# Patient Record
Sex: Male | Born: 1935 | Race: White | Hispanic: No | Marital: Married | Smoking: Former smoker
Health system: Southern US, Community
[De-identification: ages and names within clinical notes are randomized; demographics above are authoritative.]

## PROBLEM LIST (undated history)

## (undated) DIAGNOSIS — C44311 Basal cell carcinoma of skin of nose: Secondary | ICD-10-CM

## (undated) DIAGNOSIS — E785 Hyperlipidemia, unspecified: Secondary | ICD-10-CM

## (undated) DIAGNOSIS — Z9289 Personal history of other medical treatment: Secondary | ICD-10-CM

## (undated) DIAGNOSIS — I4891 Unspecified atrial fibrillation: Secondary | ICD-10-CM

## (undated) DIAGNOSIS — I10 Essential (primary) hypertension: Secondary | ICD-10-CM

## (undated) DIAGNOSIS — K56609 Unspecified intestinal obstruction, unspecified as to partial versus complete obstruction: Secondary | ICD-10-CM

## (undated) DIAGNOSIS — R918 Other nonspecific abnormal finding of lung field: Secondary | ICD-10-CM

## (undated) DIAGNOSIS — IMO0002 Reserved for concepts with insufficient information to code with codable children: Secondary | ICD-10-CM

## (undated) DIAGNOSIS — K37 Unspecified appendicitis: Secondary | ICD-10-CM

## (undated) DIAGNOSIS — I7100 Dissection of unspecified site of aorta: Secondary | ICD-10-CM

## (undated) DIAGNOSIS — D649 Anemia, unspecified: Secondary | ICD-10-CM

## (undated) DIAGNOSIS — N400 Enlarged prostate without lower urinary tract symptoms: Secondary | ICD-10-CM

## (undated) DIAGNOSIS — K226 Gastro-esophageal laceration-hemorrhage syndrome: Secondary | ICD-10-CM

## (undated) HISTORY — DX: Unspecified intestinal obstruction, unspecified as to partial versus complete obstruction: K56.609

## (undated) HISTORY — PX: COLONOSCOPY: SHX174

## (undated) HISTORY — DX: Gastro-esophageal laceration-hemorrhage syndrome: K22.6

## (undated) HISTORY — PX: BASAL CELL CARCINOMA EXCISION: SHX1214

## (undated) HISTORY — DX: Basal cell carcinoma of skin of nose: C44.311

## (undated) HISTORY — PX: COLONOSCOPY W/ BIOPSIES AND POLYPECTOMY: SHX1376

## (undated) HISTORY — PX: CATARACT EXTRACTION W/ INTRAOCULAR LENS  IMPLANT, BILATERAL: SHX1307

## (undated) HISTORY — DX: Reserved for concepts with insufficient information to code with codable children: IMO0002

## (undated) HISTORY — DX: Hyperlipidemia, unspecified: E78.5

## (undated) HISTORY — DX: Essential (primary) hypertension: I10

## (undated) HISTORY — DX: Benign prostatic hyperplasia without lower urinary tract symptoms: N40.0

## (undated) HISTORY — PX: HEMORRHOID SURGERY: SHX153

## (undated) HISTORY — DX: Unspecified appendicitis: K37

## (undated) HISTORY — DX: Unspecified atrial fibrillation: I48.91

---

## 2007-02-06 ENCOUNTER — Ambulatory Visit: Payer: Self-pay | Admitting: Cardiovascular Disease

## 2007-02-06 ENCOUNTER — Inpatient Hospital Stay (HOSPITAL_COMMUNITY): Admission: AD | Admit: 2007-02-06 | Discharge: 2007-02-14 | Payer: Self-pay | Admitting: Internal Medicine

## 2007-02-06 ENCOUNTER — Ambulatory Visit: Payer: Self-pay | Admitting: Internal Medicine

## 2007-02-06 DIAGNOSIS — H409 Unspecified glaucoma: Secondary | ICD-10-CM | POA: Insufficient documentation

## 2007-02-07 ENCOUNTER — Ambulatory Visit: Payer: Self-pay | Admitting: Internal Medicine

## 2007-02-07 ENCOUNTER — Encounter: Payer: Self-pay | Admitting: Internal Medicine

## 2007-02-07 HISTORY — PX: OTHER SURGICAL HISTORY: SHX169

## 2007-02-08 ENCOUNTER — Encounter: Payer: Self-pay | Admitting: Internal Medicine

## 2007-02-10 ENCOUNTER — Encounter: Payer: Self-pay | Admitting: Internal Medicine

## 2007-02-15 ENCOUNTER — Encounter: Payer: Self-pay | Admitting: Internal Medicine

## 2007-02-19 ENCOUNTER — Ambulatory Visit: Payer: Self-pay | Admitting: Cardiology

## 2007-02-26 ENCOUNTER — Ambulatory Visit: Payer: Self-pay | Admitting: Cardiovascular Disease

## 2007-03-05 ENCOUNTER — Ambulatory Visit: Payer: Self-pay | Admitting: Cardiovascular Disease

## 2007-03-08 ENCOUNTER — Ambulatory Visit: Payer: Self-pay

## 2007-03-08 LAB — CONVERTED CEMR LAB
ALT: 39 units/L (ref 0–53)
AST: 31 units/L (ref 0–37)
Albumin: 4 g/dL (ref 3.5–5.2)
Bilirubin, Direct: 0.1 mg/dL (ref 0.0–0.3)
Calcium: 9.2 mg/dL (ref 8.4–10.5)
Chloride: 105 meq/L (ref 96–112)
GFR calc Af Amer: 59 mL/min
GFR calc non Af Amer: 48 mL/min

## 2007-03-12 ENCOUNTER — Ambulatory Visit: Payer: Self-pay | Admitting: Cardiology

## 2007-03-26 ENCOUNTER — Ambulatory Visit: Payer: Self-pay | Admitting: Internal Medicine

## 2007-04-02 ENCOUNTER — Encounter: Payer: Self-pay | Admitting: Internal Medicine

## 2007-04-02 ENCOUNTER — Ambulatory Visit: Payer: Self-pay

## 2007-04-10 ENCOUNTER — Ambulatory Visit: Payer: Self-pay | Admitting: Internal Medicine

## 2007-04-10 ENCOUNTER — Ambulatory Visit: Payer: Self-pay | Admitting: Cardiology

## 2007-05-01 ENCOUNTER — Ambulatory Visit: Payer: Self-pay | Admitting: Cardiology

## 2007-05-10 ENCOUNTER — Ambulatory Visit: Payer: Self-pay | Admitting: Cardiology

## 2007-05-10 LAB — CONVERTED CEMR LAB
ALT: 52 units/L (ref 0–53)
AST: 34 units/L (ref 0–37)
Basophils Absolute: 0 10*3/uL (ref 0.0–0.1)
Bilirubin, Direct: 0.1 mg/dL (ref 0.0–0.3)
Eosinophils Relative: 2.7 % (ref 0.0–5.0)
HCT: 39.5 % (ref 39.0–52.0)
Hemoglobin: 13.9 g/dL (ref 13.0–17.0)
MCHC: 35.2 g/dL (ref 30.0–36.0)
Monocytes Absolute: 0.7 10*3/uL (ref 0.2–0.7)
Neutrophils Relative %: 57.6 % (ref 43.0–77.0)
RBC: 4.42 M/uL (ref 4.22–5.81)
RDW: 15.5 % — ABNORMAL HIGH (ref 11.5–14.6)
TSH: 4.27 microintl units/mL (ref 0.35–5.50)
Total Bilirubin: 0.7 mg/dL (ref 0.3–1.2)
Total Protein: 6.7 g/dL (ref 6.0–8.3)
WBC: 5.4 10*3/uL (ref 4.5–10.5)

## 2007-05-29 ENCOUNTER — Ambulatory Visit: Payer: Self-pay | Admitting: Cardiovascular Disease

## 2007-06-26 ENCOUNTER — Ambulatory Visit: Payer: Self-pay | Admitting: Cardiology

## 2007-07-25 ENCOUNTER — Ambulatory Visit: Payer: Self-pay | Admitting: Cardiology

## 2007-08-23 DIAGNOSIS — Z9289 Personal history of other medical treatment: Secondary | ICD-10-CM

## 2007-08-23 HISTORY — DX: Personal history of other medical treatment: Z92.89

## 2007-09-10 ENCOUNTER — Ambulatory Visit: Payer: Self-pay | Admitting: Cardiology

## 2007-09-12 ENCOUNTER — Ambulatory Visit: Payer: Self-pay | Admitting: Internal Medicine

## 2007-09-12 LAB — CONVERTED CEMR LAB
ALT: 47 units/L (ref 0–53)
AST: 32 units/L (ref 0–37)
Albumin: 3.9 g/dL (ref 3.5–5.2)
Alkaline Phosphatase: 76 units/L (ref 39–117)
Basophils Absolute: 0 10*3/uL (ref 0.0–0.1)
Basophils Relative: 0.7 % (ref 0.0–1.0)
MCHC: 35.3 g/dL (ref 30.0–36.0)
Monocytes Absolute: 0.6 10*3/uL (ref 0.2–0.7)
Monocytes Relative: 11.4 % — ABNORMAL HIGH (ref 3.0–11.0)
Platelets: 183 10*3/uL (ref 150–400)
RBC: 4.8 M/uL (ref 4.22–5.81)
RDW: 12.1 % (ref 11.5–14.6)
TSH: 6.65 microintl units/mL — ABNORMAL HIGH (ref 0.35–5.50)
Total Bilirubin: 0.8 mg/dL (ref 0.3–1.2)

## 2007-09-27 HISTORY — PX: OTHER SURGICAL HISTORY: SHX169

## 2007-10-08 ENCOUNTER — Ambulatory Visit: Payer: Self-pay | Admitting: Internal Medicine

## 2007-10-08 ENCOUNTER — Ambulatory Visit: Payer: Self-pay | Admitting: Cardiovascular Disease

## 2007-10-08 DIAGNOSIS — I429 Cardiomyopathy, unspecified: Secondary | ICD-10-CM | POA: Insufficient documentation

## 2007-10-08 DIAGNOSIS — I480 Paroxysmal atrial fibrillation: Secondary | ICD-10-CM | POA: Insufficient documentation

## 2007-10-08 DIAGNOSIS — I10 Essential (primary) hypertension: Secondary | ICD-10-CM

## 2007-11-05 ENCOUNTER — Ambulatory Visit: Payer: Self-pay | Admitting: Internal Medicine

## 2007-11-05 ENCOUNTER — Telehealth (INDEPENDENT_AMBULATORY_CARE_PROVIDER_SITE_OTHER): Payer: Self-pay | Admitting: *Deleted

## 2007-11-05 ENCOUNTER — Encounter (INDEPENDENT_AMBULATORY_CARE_PROVIDER_SITE_OTHER): Payer: Self-pay | Admitting: *Deleted

## 2007-11-05 LAB — CONVERTED CEMR LAB
Ketones, urine, test strip: NEGATIVE
Nitrite: NEGATIVE
OCCULT 1: NEGATIVE
Protein, U semiquant: NEGATIVE

## 2007-11-13 ENCOUNTER — Telehealth: Payer: Self-pay | Admitting: Internal Medicine

## 2007-11-13 LAB — CONVERTED CEMR LAB
CO2: 28 meq/L (ref 19–32)
Cholesterol: 210 mg/dL (ref 0–200)
Creatinine, Ser: 1.3 mg/dL (ref 0.4–1.5)
GFR calc Af Amer: 69 mL/min
HDL: 32.9 mg/dL — ABNORMAL LOW (ref >39.0)
PSA: 0.72 ng/mL (ref 0.10–4.00)
Potassium: 4.7 meq/L (ref 3.5–5.1)
Sodium: 142 meq/L (ref 135–145)
Triglycerides: 162 mg/dL — ABNORMAL HIGH (ref 0–149)
VLDL: 32 mg/dL (ref 0–40)

## 2007-12-05 ENCOUNTER — Ambulatory Visit: Payer: Self-pay | Admitting: Cardiology

## 2008-01-02 ENCOUNTER — Ambulatory Visit: Payer: Self-pay | Admitting: Cardiology

## 2008-01-30 ENCOUNTER — Ambulatory Visit: Payer: Self-pay | Admitting: Cardiology

## 2008-02-20 HISTORY — PX: APPENDECTOMY: SHX54

## 2008-02-20 HISTORY — PX: EXPLORATORY LAPAROTOMY: SUR591

## 2008-02-27 ENCOUNTER — Ambulatory Visit: Payer: Self-pay | Admitting: Cardiovascular Disease

## 2008-03-11 ENCOUNTER — Telehealth: Payer: Self-pay | Admitting: Internal Medicine

## 2008-03-11 ENCOUNTER — Inpatient Hospital Stay (HOSPITAL_COMMUNITY): Admission: AD | Admit: 2008-03-11 | Discharge: 2008-03-26 | Payer: Self-pay | Admitting: Internal Medicine

## 2008-03-11 ENCOUNTER — Ambulatory Visit: Payer: Self-pay | Admitting: Internal Medicine

## 2008-03-11 LAB — CONVERTED CEMR LAB
Bilirubin Urine: NEGATIVE
Hemoglobin: 15.2 g/dL
Ketones, urine, test strip: NEGATIVE
Protein, U semiquant: NEGATIVE
Urobilinogen, UA: 0.2

## 2008-03-16 ENCOUNTER — Encounter (INDEPENDENT_AMBULATORY_CARE_PROVIDER_SITE_OTHER): Payer: Self-pay | Admitting: General Surgery

## 2008-03-16 ENCOUNTER — Ambulatory Visit: Payer: Self-pay | Admitting: Internal Medicine

## 2008-03-16 ENCOUNTER — Encounter: Payer: Self-pay | Admitting: Internal Medicine

## 2008-03-17 ENCOUNTER — Encounter: Payer: Self-pay | Admitting: Internal Medicine

## 2008-03-18 ENCOUNTER — Ambulatory Visit: Payer: Self-pay | Admitting: Internal Medicine

## 2008-03-25 ENCOUNTER — Telehealth (INDEPENDENT_AMBULATORY_CARE_PROVIDER_SITE_OTHER): Payer: Self-pay | Admitting: *Deleted

## 2008-03-28 ENCOUNTER — Ambulatory Visit: Payer: Self-pay | Admitting: Internal Medicine

## 2008-04-11 ENCOUNTER — Ambulatory Visit: Payer: Self-pay | Admitting: Internal Medicine

## 2008-04-15 ENCOUNTER — Encounter (INDEPENDENT_AMBULATORY_CARE_PROVIDER_SITE_OTHER): Payer: Self-pay | Admitting: *Deleted

## 2008-04-15 LAB — CONVERTED CEMR LAB
ALT: 27 units/L (ref 0–53)
Basophils Relative: 0.9 % (ref 0.0–3.0)
Bilirubin, Direct: 0.1 mg/dL (ref 0.0–0.3)
Calcium: 9.3 mg/dL (ref 8.4–10.5)
Eosinophils Relative: 21.9 % — ABNORMAL HIGH (ref 0.0–5.0)
HCT: 35.1 % — ABNORMAL LOW (ref 39.0–52.0)
Hemoglobin: 11.9 g/dL — ABNORMAL LOW (ref 13.0–17.0)
Monocytes Absolute: 0.8 10*3/uL (ref 0.1–1.0)
Monocytes Relative: 11.1 % (ref 3.0–12.0)
Neutro Abs: 2.8 10*3/uL (ref 1.4–7.7)
RBC: 3.85 M/uL — ABNORMAL LOW (ref 4.22–5.81)
Total Protein: 6.8 g/dL (ref 6.0–8.3)
WBC: 7.1 10*3/uL (ref 4.5–10.5)

## 2008-04-22 ENCOUNTER — Encounter: Payer: Self-pay | Admitting: Internal Medicine

## 2008-06-02 ENCOUNTER — Ambulatory Visit: Payer: Self-pay | Admitting: Internal Medicine

## 2008-06-03 ENCOUNTER — Encounter: Payer: Self-pay | Admitting: Internal Medicine

## 2008-06-05 ENCOUNTER — Telehealth (INDEPENDENT_AMBULATORY_CARE_PROVIDER_SITE_OTHER): Payer: Self-pay | Admitting: *Deleted

## 2008-06-16 ENCOUNTER — Telehealth (INDEPENDENT_AMBULATORY_CARE_PROVIDER_SITE_OTHER): Payer: Self-pay | Admitting: *Deleted

## 2008-06-16 ENCOUNTER — Ambulatory Visit: Payer: Self-pay | Admitting: Internal Medicine

## 2008-06-24 ENCOUNTER — Telehealth: Payer: Self-pay | Admitting: Internal Medicine

## 2008-06-30 ENCOUNTER — Ambulatory Visit: Payer: Self-pay | Admitting: Internal Medicine

## 2008-07-02 ENCOUNTER — Encounter (INDEPENDENT_AMBULATORY_CARE_PROVIDER_SITE_OTHER): Payer: Self-pay | Admitting: *Deleted

## 2008-07-04 ENCOUNTER — Ambulatory Visit: Payer: Self-pay | Admitting: Internal Medicine

## 2008-12-04 ENCOUNTER — Telehealth (INDEPENDENT_AMBULATORY_CARE_PROVIDER_SITE_OTHER): Payer: Self-pay | Admitting: *Deleted

## 2008-12-30 ENCOUNTER — Ambulatory Visit: Payer: Self-pay | Admitting: Internal Medicine

## 2008-12-30 DIAGNOSIS — E785 Hyperlipidemia, unspecified: Secondary | ICD-10-CM

## 2008-12-30 DIAGNOSIS — L989 Disorder of the skin and subcutaneous tissue, unspecified: Secondary | ICD-10-CM | POA: Insufficient documentation

## 2008-12-31 ENCOUNTER — Encounter (INDEPENDENT_AMBULATORY_CARE_PROVIDER_SITE_OTHER): Payer: Self-pay | Admitting: *Deleted

## 2009-01-06 LAB — CONVERTED CEMR LAB
BUN: 19 mg/dL (ref 6–23)
Eosinophils Relative: 3.3 % (ref 0.0–5.0)
GFR calc non Af Amer: 68.91 mL/min (ref >60)
HCT: 41.3 % (ref 39.0–52.0)
Lymphs Abs: 1.8 10*3/uL (ref 0.7–4.0)
Monocytes Relative: 6.1 % (ref 3.0–12.0)
Neutrophils Relative %: 54 % (ref 43.0–77.0)
Platelets: 196 10*3/uL (ref 150.0–400.0)
Potassium: 4.9 meq/L (ref 3.5–5.1)
TSH: 3.85 microintl units/mL (ref 0.35–5.50)
WBC: 4.9 10*3/uL (ref 4.5–10.5)

## 2009-01-12 ENCOUNTER — Ambulatory Visit: Payer: Self-pay | Admitting: Internal Medicine

## 2009-01-12 LAB — CONVERTED CEMR LAB
Bilirubin Urine: NEGATIVE
Glucose, Urine, Semiquant: NEGATIVE
Ketones, urine, test strip: NEGATIVE
RBC / HPF: NONE SEEN (ref <3)
WBC, UA: NONE SEEN cells/hpf (ref <3)

## 2009-01-14 ENCOUNTER — Ambulatory Visit: Payer: Self-pay | Admitting: Internal Medicine

## 2009-01-14 ENCOUNTER — Telehealth: Payer: Self-pay | Admitting: Internal Medicine

## 2009-01-16 ENCOUNTER — Telehealth: Payer: Self-pay | Admitting: Internal Medicine

## 2009-01-16 LAB — CONVERTED CEMR LAB
Albumin: 3.4 g/dL — ABNORMAL LOW (ref 3.5–5.2)
Eosinophils Relative: 0.1 % (ref 0.0–5.0)
HCT: 43.1 % (ref 39.0–52.0)
Hemoglobin: 15.1 g/dL (ref 13.0–17.0)
Lymphocytes Relative: 10.5 % — ABNORMAL LOW (ref 12.0–46.0)
Lymphs Abs: 1 10*3/uL (ref 0.7–4.0)
Monocytes Relative: 10.9 % (ref 3.0–12.0)
Neutro Abs: 7.4 10*3/uL (ref 1.4–7.7)
Platelets: 184 10*3/uL (ref 150.0–400.0)
WBC: 9.5 10*3/uL (ref 4.5–10.5)

## 2009-02-04 ENCOUNTER — Ambulatory Visit: Payer: Self-pay | Admitting: Internal Medicine

## 2009-02-04 ENCOUNTER — Telehealth: Payer: Self-pay | Admitting: Internal Medicine

## 2009-02-12 ENCOUNTER — Ambulatory Visit: Payer: Self-pay | Admitting: Internal Medicine

## 2009-02-16 LAB — CONVERTED CEMR LAB
ALT: 25 units/L (ref 0–53)
AST: 27 units/L (ref 0–37)
Alkaline Phosphatase: 92 units/L (ref 39–117)
Total Bilirubin: 0.8 mg/dL (ref 0.3–1.2)

## 2009-04-14 ENCOUNTER — Ambulatory Visit: Payer: Self-pay | Admitting: Internal Medicine

## 2009-04-15 ENCOUNTER — Encounter (INDEPENDENT_AMBULATORY_CARE_PROVIDER_SITE_OTHER): Payer: Self-pay | Admitting: *Deleted

## 2009-04-20 ENCOUNTER — Ambulatory Visit: Payer: Self-pay | Admitting: Internal Medicine

## 2009-04-20 LAB — CONVERTED CEMR LAB
Alkaline Phosphatase: 95 units/L (ref 39–117)
Bilirubin, Direct: 0 mg/dL (ref 0.0–0.3)
PSA: 1.11 ng/mL (ref 0.10–4.00)
Total Bilirubin: 0.9 mg/dL (ref 0.3–1.2)
Total Protein: 7.6 g/dL (ref 6.0–8.3)
VLDL: 45.6 mg/dL — ABNORMAL HIGH (ref 0.0–40.0)

## 2009-04-21 ENCOUNTER — Encounter (INDEPENDENT_AMBULATORY_CARE_PROVIDER_SITE_OTHER): Payer: Self-pay | Admitting: *Deleted

## 2009-04-21 LAB — CONVERTED CEMR LAB: Fecal Occult Bld: NEGATIVE

## 2009-06-19 IMAGING — CR DG ABDOMEN ACUTE W/ 1V CHEST
4 series · 4 of 4 positions shown · non-contrast
Comparison: Two views abdomen 03/13/2008 and portable chest
02/12/2007.

CLINICAL DATA: Small bowel obstruction, abdominal pain.

ACUTE ABDOMEN SERIES (ABDOMEN 2 VIEW & CHEST 1 VIEW)

[w chest pa]
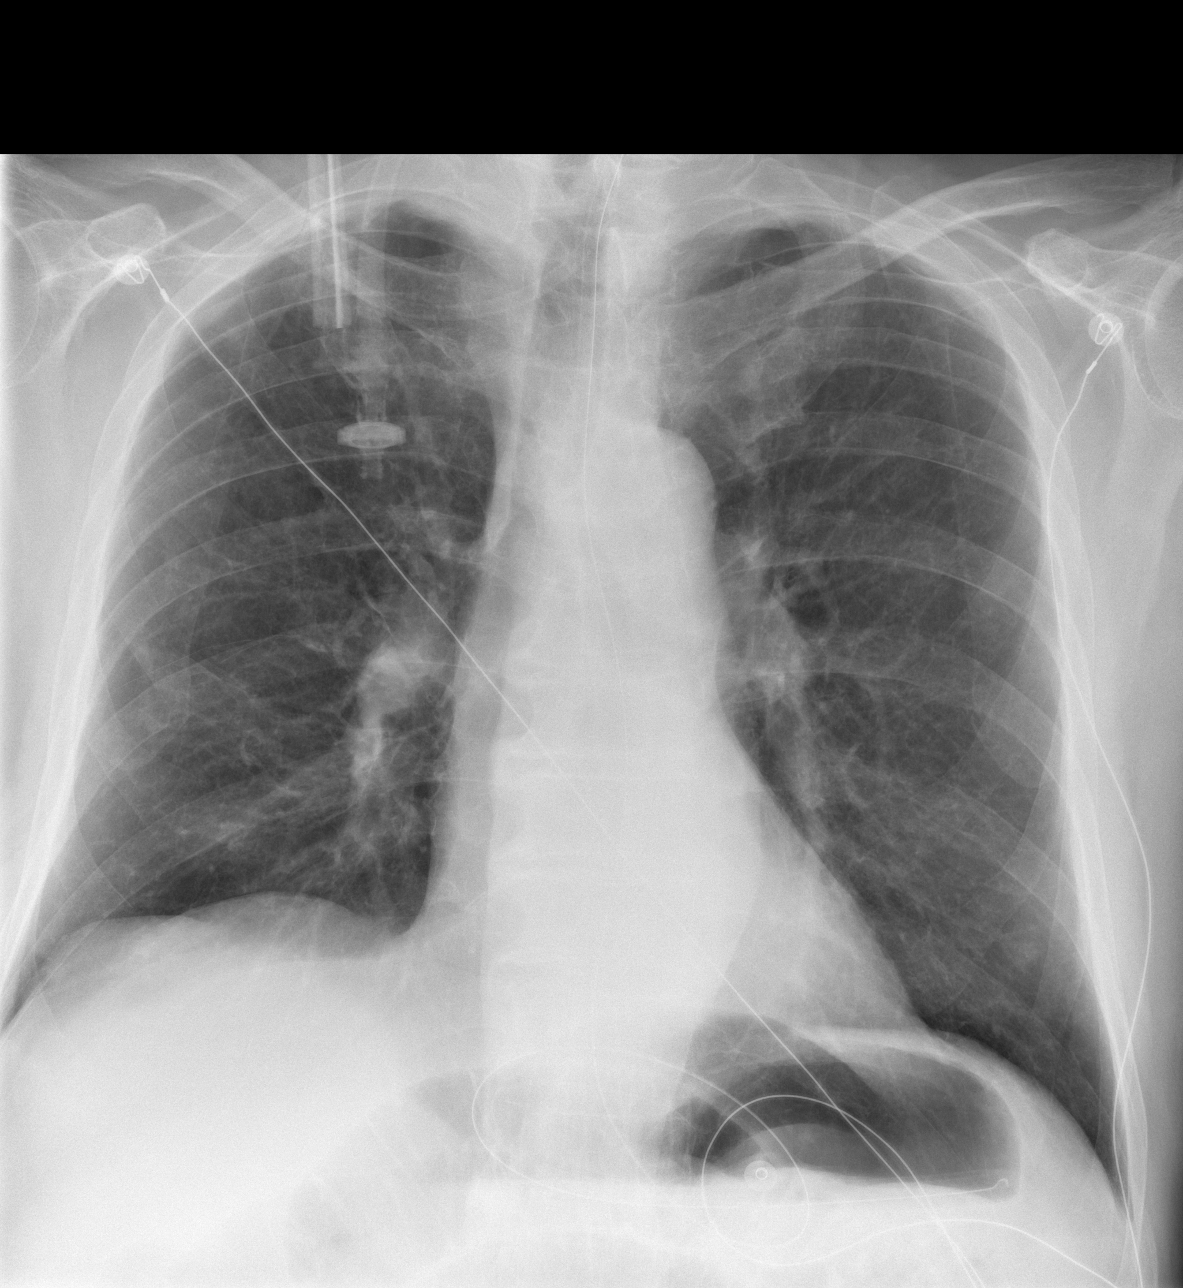

[w abdomen upright]
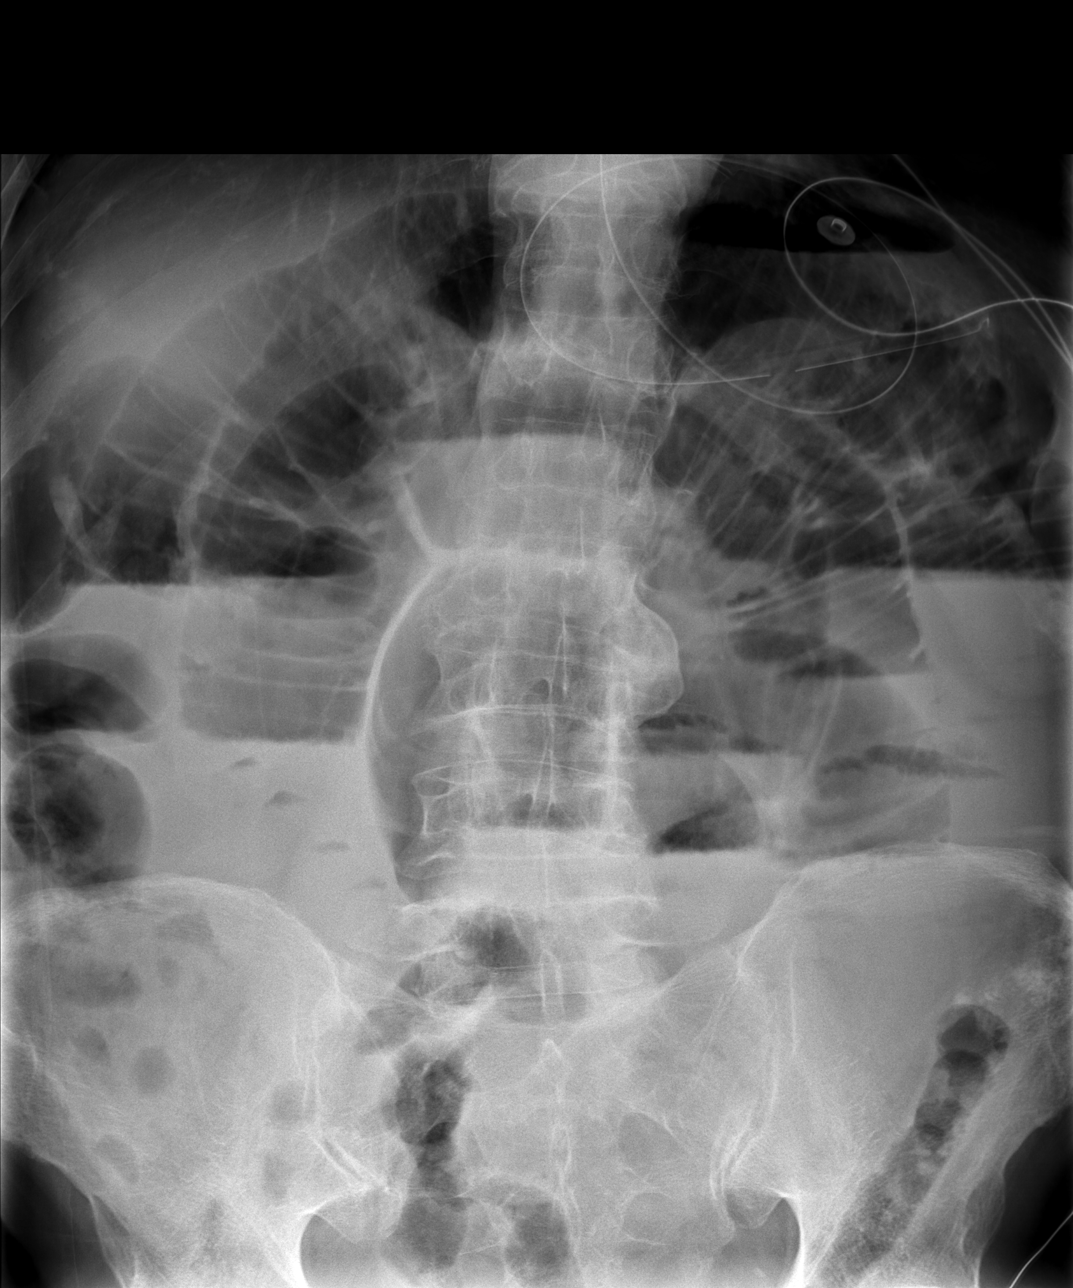

[t abdomen supine (1 of 2)]
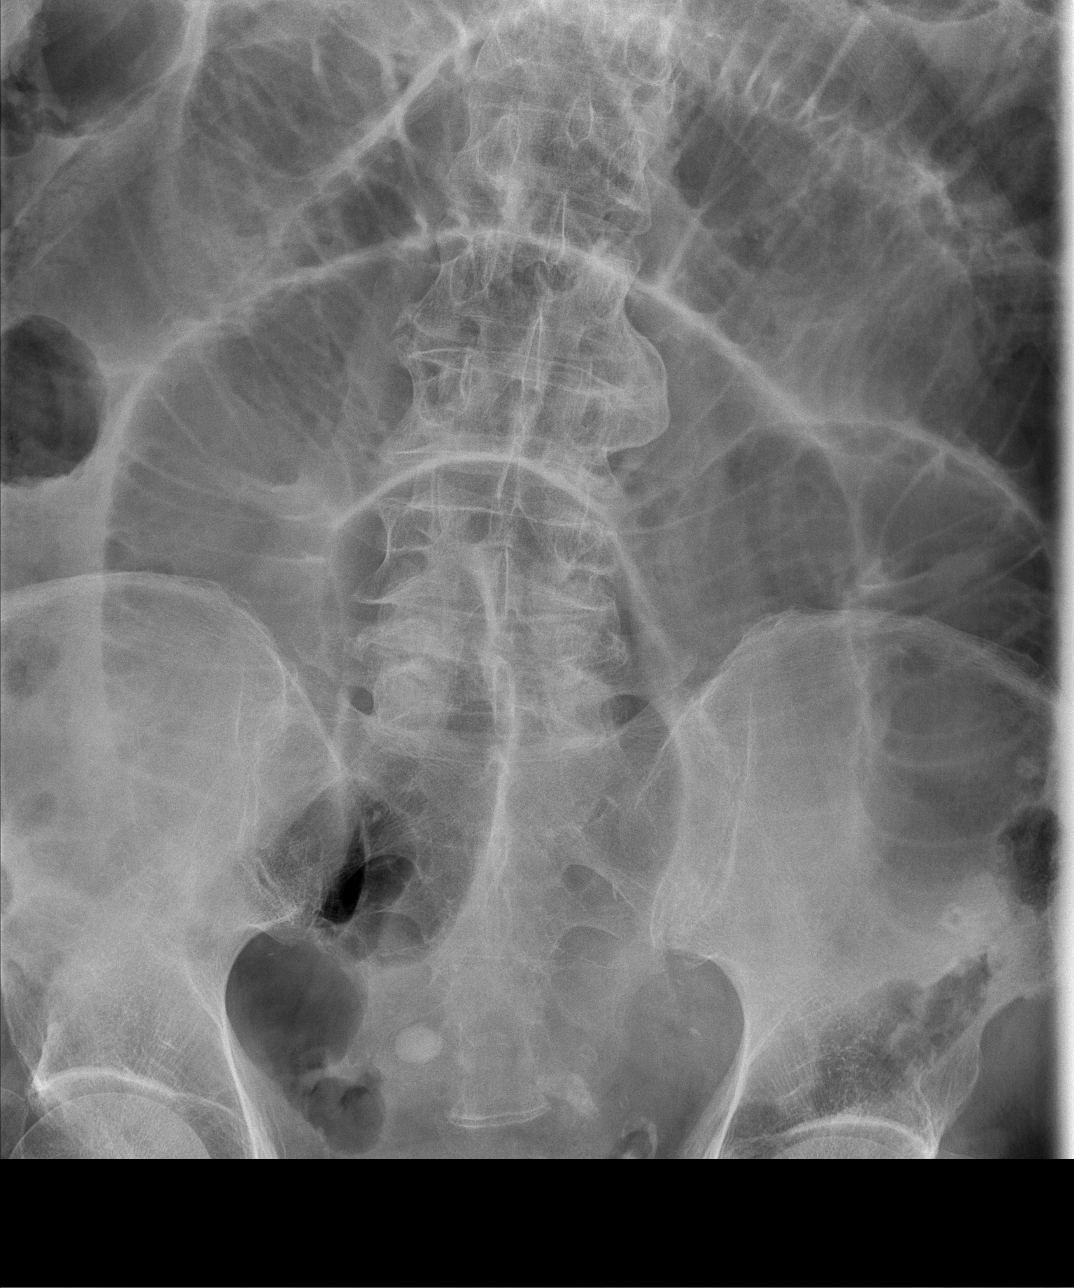

[t abdomen supine (2 of 2)]
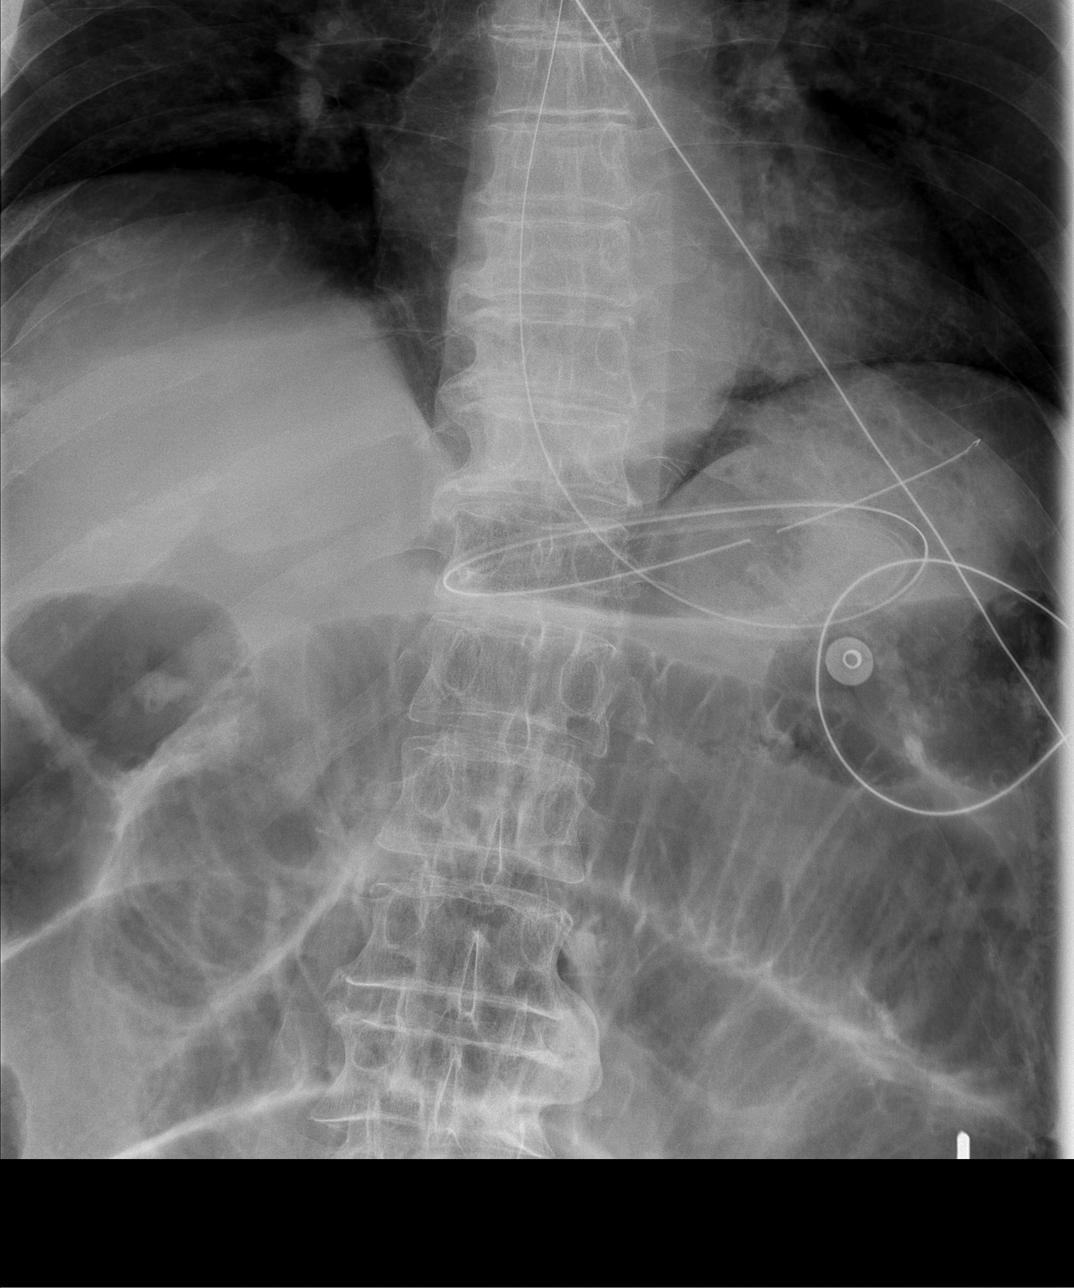

[4 of 4 positions shown; findings below may reference images not displayed]

FINDINGS: Single view of the chest demonstrates clear lungs and
normal heart size.  There is no pleural effusion.

Two views the abdomen show NG tube with its tip in the stomach.
Gaseous distension of small bowel has worsened with loops now
measuring up to approximate 6.7 cm in diameter compared to 5.4 cm.
No free intraperitoneal air is identified.
IMPRESSION: Worsened appearance of small bowel obstruction.

## 2009-06-20 IMAGING — CR DG CHEST 1V PORT
1 series · 1 of 1 positions shown · non-contrast
Comparison: 02/12/2007.

CLINICAL DATA: Line placement position.

PORTABLE CHEST - 1 VIEW

[AP]
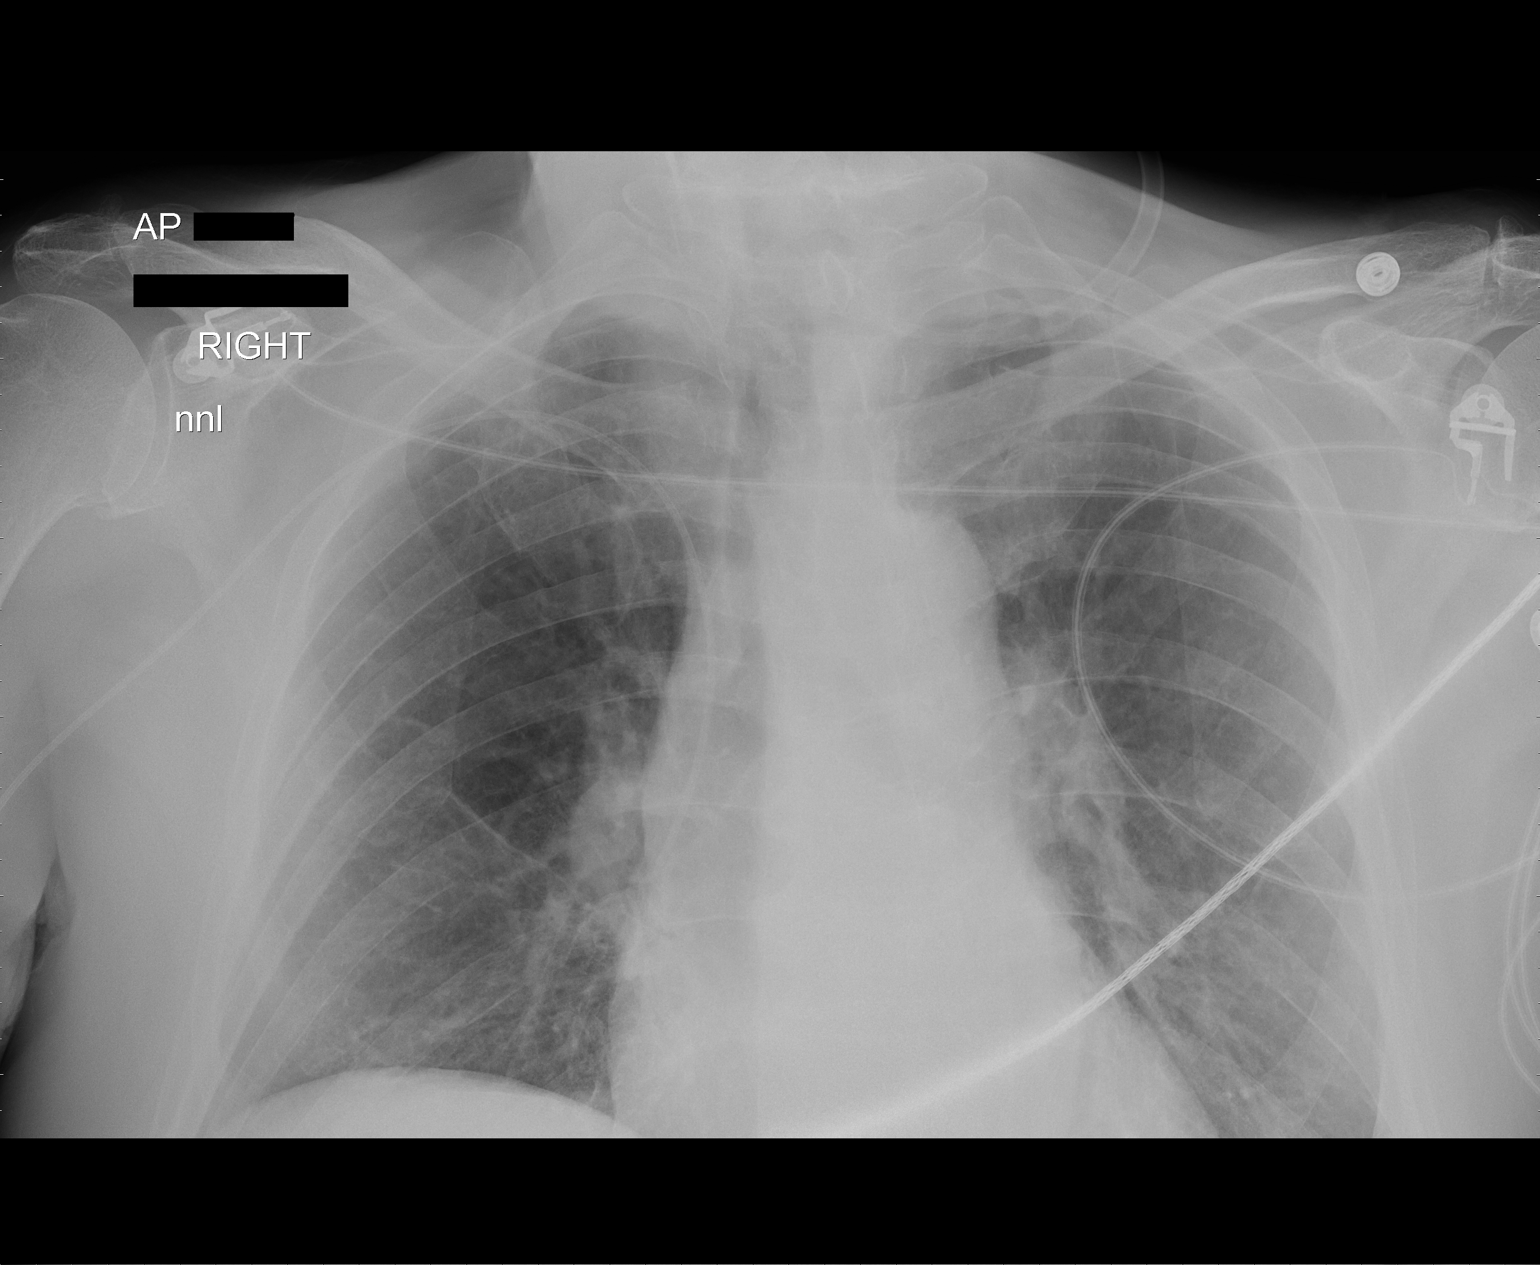

[1 of 1 positions shown; findings below may reference images not displayed]

FINDINGS: Right PICC line tip distal superior vena cava.  Biapical
pleural thickening without bony destruction unchanged.  Top normal
sized heart.  Mild central pulmonary vascular prominence.  No
segmental region of consolidation.
IMPRESSION: Right PICC line tip distal superior vena cava.

## 2009-06-24 ENCOUNTER — Encounter (INDEPENDENT_AMBULATORY_CARE_PROVIDER_SITE_OTHER): Payer: Self-pay | Admitting: *Deleted

## 2009-07-10 ENCOUNTER — Ambulatory Visit: Payer: Self-pay | Admitting: Internal Medicine

## 2009-07-13 ENCOUNTER — Telehealth: Payer: Self-pay | Admitting: Internal Medicine

## 2009-07-28 ENCOUNTER — Ambulatory Visit: Payer: Self-pay

## 2009-07-28 ENCOUNTER — Ambulatory Visit: Payer: Self-pay | Admitting: Internal Medicine

## 2009-07-28 ENCOUNTER — Ambulatory Visit (HOSPITAL_COMMUNITY): Admission: RE | Admit: 2009-07-28 | Discharge: 2009-07-28 | Payer: Self-pay | Admitting: Internal Medicine

## 2009-07-28 ENCOUNTER — Encounter: Payer: Self-pay | Admitting: Internal Medicine

## 2009-07-31 ENCOUNTER — Telehealth (INDEPENDENT_AMBULATORY_CARE_PROVIDER_SITE_OTHER): Payer: Self-pay | Admitting: *Deleted

## 2009-10-12 ENCOUNTER — Ambulatory Visit: Payer: Self-pay | Admitting: Internal Medicine

## 2009-10-14 ENCOUNTER — Telehealth (INDEPENDENT_AMBULATORY_CARE_PROVIDER_SITE_OTHER): Payer: Self-pay | Admitting: *Deleted

## 2009-10-14 LAB — CONVERTED CEMR LAB: TSH: 6.4 microintl units/mL — ABNORMAL HIGH (ref 0.35–5.50)

## 2009-10-15 ENCOUNTER — Ambulatory Visit: Payer: Self-pay | Admitting: Internal Medicine

## 2009-10-22 ENCOUNTER — Telehealth: Payer: Self-pay | Admitting: Internal Medicine

## 2009-10-22 LAB — CONVERTED CEMR LAB
Free T4: 0.8 ng/dL (ref 0.6–1.6)
T3, Free: 2.6 pg/mL (ref 2.3–4.2)

## 2009-11-18 ENCOUNTER — Encounter (INDEPENDENT_AMBULATORY_CARE_PROVIDER_SITE_OTHER): Payer: Self-pay | Admitting: *Deleted

## 2009-12-29 ENCOUNTER — Ambulatory Visit: Payer: Self-pay | Admitting: Internal Medicine

## 2010-04-05 ENCOUNTER — Telehealth (INDEPENDENT_AMBULATORY_CARE_PROVIDER_SITE_OTHER): Payer: Self-pay | Admitting: *Deleted

## 2010-04-28 ENCOUNTER — Telehealth (INDEPENDENT_AMBULATORY_CARE_PROVIDER_SITE_OTHER): Payer: Self-pay | Admitting: *Deleted

## 2010-06-01 ENCOUNTER — Encounter: Payer: Self-pay | Admitting: Internal Medicine

## 2010-06-14 ENCOUNTER — Telehealth: Payer: Self-pay | Admitting: Internal Medicine

## 2010-06-21 ENCOUNTER — Ambulatory Visit: Payer: Self-pay | Admitting: Internal Medicine

## 2010-06-28 ENCOUNTER — Ambulatory Visit: Payer: Self-pay | Admitting: Internal Medicine

## 2010-06-28 DIAGNOSIS — R7989 Other specified abnormal findings of blood chemistry: Secondary | ICD-10-CM | POA: Insufficient documentation

## 2010-06-29 ENCOUNTER — Ambulatory Visit: Payer: Self-pay | Admitting: Internal Medicine

## 2010-07-01 ENCOUNTER — Encounter (INDEPENDENT_AMBULATORY_CARE_PROVIDER_SITE_OTHER): Payer: Self-pay | Admitting: *Deleted

## 2010-07-01 LAB — CONVERTED CEMR LAB
BUN: 18 mg/dL (ref 6–23)
Basophils Relative: 0.6 % (ref 0.0–3.0)
CO2: 27 meq/L (ref 19–32)
Chloride: 105 meq/L (ref 96–112)
Eosinophils Absolute: 0.2 10*3/uL (ref 0.0–0.7)
Eosinophils Relative: 3.1 % (ref 0.0–5.0)
Free T4: 0.73 ng/dL (ref 0.60–1.60)
Glucose, Bld: 96 mg/dL (ref 70–99)
HCT: 43.5 % (ref 39.0–52.0)
HDL: 39 mg/dL — ABNORMAL LOW (ref >39.00)
Hemoglobin: 15.2 g/dL (ref 13.0–17.0)
MCHC: 34.8 g/dL (ref 30.0–36.0)
MCV: 95.5 fL (ref 78.0–100.0)
Monocytes Absolute: 0.6 10*3/uL (ref 0.1–1.0)
Neutro Abs: 4 10*3/uL (ref 1.4–7.7)
Neutrophils Relative %: 58.8 % (ref 43.0–77.0)
PSA: 1.06 ng/mL (ref 0.10–4.00)
Potassium: 4.4 meq/L (ref 3.5–5.1)
RBC: 4.56 M/uL (ref 4.22–5.81)
Sodium: 140 meq/L (ref 135–145)
TSH: 5.12 microintl units/mL (ref 0.35–5.50)
WBC: 6.8 10*3/uL (ref 4.5–10.5)

## 2010-07-12 ENCOUNTER — Ambulatory Visit: Payer: Self-pay | Admitting: Internal Medicine

## 2010-07-19 LAB — CONVERTED CEMR LAB: Fecal Occult Bld: NEGATIVE

## 2010-09-19 LAB — CONVERTED CEMR LAB
Calcium: 9.4 mg/dL (ref 8.4–10.5)
GFR calc Af Amer: 69 mL/min
Glucose, Bld: 74 mg/dL (ref 70–99)
Hemoglobin: 16.3 g/dL
Sodium: 142 meq/L (ref 135–145)
TSH: 5.491 microintl units/mL — ABNORMAL HIGH (ref 0.350–4.500)

## 2010-09-21 NOTE — Progress Notes (Signed)
Summary: refill  Phone Note Refill Request Message from:  Fax from Pharmacy on April 05, 2010 2:24 PM  Refills Requested: Medication #1:  BISOPROLOL FUMARATE 5 MG  TABS 1/2 by mouth bid Shela Leff [id number]  Initial call taken by: Okey Regal Spring,  April 05, 2010 2:25 PM    Prescriptions: BISOPROLOL FUMARATE 5 MG  TABS (BISOPROLOL FUMARATE) 1/2 by mouth bid  #90 x 3   Entered by:   [name] CMA   Authorized by:   Nolon Rod. Paz MD   Signed by:   [name] CMA on 04/05/2010   Method used:   Electronically to        [facility]* (retail)       [address] [place], [place]  [address]       Ph: [phone] or [phone]       Fax: [phone]   RxID:   [id number]

## 2010-09-21 NOTE — Assessment & Plan Note (Signed)
Summary: 6 mth fu/kdc   Vital Signs:  Patient profile:   77 year old male Height:      74.5 inches Weight:      255.50 pounds BMI:     32.48 Pulse rate:   60 / minute BP sitting:   140 / 70  Vitals Entered By: Kandice Hams (Dec 29, 2009 12:36 PM) CC: 6 mos followup   History of Present Illness: ROV HTN -- ambulatory BPs 120s, pulse in the 79s  CARDIOMYOPATHY--saw cardiology 06/2009, echo was ordered and it was okay . he also takes amiodarone, dose was decreased by cardiology; TSH is slightly elevated, free T3 T4 normal    Allergies: No Known Drug Allergies  Past History:  Past Medical History: Reviewed history from 04/14/2009 and no changes required. HTN  CARDIOMYOPATHY, SECONDARY to Tachycadrdia     A.  ACUTE ON CHRONIC ADMIT FOR CHF 6/08     B.  CHF COUPLED WITH ATRIAL FIBRILLATION RVR ELECTROPHYSIOLOGY STUDY 09/27/07     A.  MULTIPLE FLUTTERS AND ATRIALFIB --NO ABLATION PERFORMED     B.  IBUTILIDE CARDIOVERSION     C.  AMIODARONE LOAD -- D/C IN SINUS RHYTHM     D.  COUMADIN ANTICOAGULATION  --STOPPED AT TIME OF GI BLEED 11/09 MALLORY-WEISS TEAR/ ESOPHAGITIS -- 6 UNIT HEMORRHAGE --  EPI INJECTION GLAUCOMA NOS (ICD-365.9) Atrial fibrillation appendicitis, SBO 7-09, RUPTURE WITH RLQ ABSCESS 03/16/08 Hyperlipidemia  Past Surgical History: Reviewed history from 08/05/2008 and no changes required. Hemorrhoidectomy TEE/DCCV 02/07/07 EP STUDY 09/27/07 SEE PMH EXPLORAQTORY LAPOROTOMY -- RUPTURED APPENDIX, RLQ ABSCESS 03/16/08 SMALL BOWEL OBSTRUDTION MALLORY-WEISS TEAR, EPINEPHRINE INJECTION  (6 UNITS LOST) -  7/09  Social History: Reviewed history from 04/14/2009 and no changes required. WORKS AS A CABINETMAKER-- works part time  MARRIED 2 CHILDREN  REMOTE TOBACCO ETOH--no  Review of Systems CV:  Denies chest pain or discomfort, palpitations, and swelling of feet. Resp:  Denies cough and shortness of breath.  Physical Exam  General:  alert and well-developed.     Lungs:  normal respiratory effort, no intercostal retractions, no accessory muscle use, and normal breath sounds.   Heart:  normal rate, seems to be on regular rhythm, and no murmur.   Extremities:  no pretibial edema bilaterally    Impression & Recommendations:  Problem # 1:  HYPERLIPIDEMIA (ICD-272.4) patient was recommended  cholesterol  medication but declined explained the patient the benefits of lower his cholesterol, ie decrease his risk of heart attack and strokes Plan: Recheck on return to the office Labs Reviewed: SGOT: 26 (04/14/2009)   SGPT: 29 (04/14/2009)   HDL:34.80 (04/14/2009), 32.9 (11/05/2007)  LDL:DEL (11/05/2007)  Chol:208 (04/14/2009), 210 (11/05/2007)  Trig:228.0 (04/14/2009), 162 (11/05/2007)  Problem # 2:  HYPERTENSION (ICD-401.9) good ambulatory blood pressures His updated medication list for this problem includes:    Bisoprolol Fumarate 5 Mg Tabs (Bisoprolol fumarate) .Marland Kitchen... 1/2 by mouth bid    Norvasc 5 Mg Tabs (Amlodipine besylate) .Marland Kitchen... 1 by mouth qd  BP today: 140/70 Prior BP: 159/90 (07/10/2009)  Labs Reviewed: K+: 4.9 (12/30/2008) Creat: : 1.1 (12/30/2008)   Chol: 208 (04/14/2009)   HDL: 34.80 (04/14/2009)   LDL: DEL (11/05/2007)   TG: 228.0 (04/14/2009)  Problem # 3:  ATRIAL FIBRILLATION (ICD-427.31) saw  cardiology 06/2009 stable, they did decrease the amiodarone dose a little bit they did an echocardiogram : grade 1 diastolic dysfunction TSH is slightly elevated, normal T3-T4. Plan: Recheck TFTs on return to the office His updated medication list  for this problem includes:    Aspirin 325 Mg Tabs (Aspirin) .Marland Kitchen... Take one tablet daily    Bisoprolol Fumarate 5 Mg Tabs (Bisoprolol fumarate) .Marland Kitchen... 1/2 by mouth bid    Norvasc 5 Mg Tabs (Amlodipine besylate) .Marland Kitchen... 1 by mouth qd    Amiodarone Hcl 200 Mg Tabs (Amiodarone hcl) .Marland Kitchen... 1 by mouth 5 days per week- skipping wed and sun  Problem # 4:       Complete Medication List: 1)  Aspirin 325 Mg  Tabs (Aspirin) .... Take one tablet daily 2)  One-daily Multivitamins Tabs (Multiple vitamin) .... One tablet daily 3)  Bisoprolol Fumarate 5 Mg Tabs (Bisoprolol fumarate) .... 1/2 by mouth bid 4)  Norvasc 5 Mg Tabs (Amlodipine besylate) .Marland Kitchen.. 1 by mouth qd 5)  Azopt 1 % Susp (Brinzolamide) .... Bid 6)  Timoptic 0.5 % Soln (Timolol maleate) .... Ou qd 7)  Amiodarone Hcl 200 Mg Tabs (Amiodarone hcl) .Marland Kitchen.. 1 by mouth 5 days per week- skipping wed and sun 8)  Docusate Sodium 100 Mg  .Marland Kitchen.. 1 o once daily  Patient Instructions: 1)  Please schedule a follow-up appointment in 6 months .(fasting, yearly checkup)

## 2010-09-21 NOTE — Letter (Signed)
Summary: Primary Care Appointment Letter  [facility] [place]  [address] [place], [place] [address]   Phone: [phone]  Fax: [phone]    11/18/2009 MRN: [id number]  Antonio [name] [address] [place], [place]  [address]  Dear [name],   Your Primary Care Physician Nolon Rod. Paz MD has indicated that:    _______it is time to schedule an appointment.    _______you missed your appointment on______ and need to call and          reschedule.    _______you need to have lab work done.    _______you need to schedule an appointment discuss lab or test results.    ____x___you need to call to reschedule your appointment that is                       scheduled on __april 20,2011 @ 2:40pm_______.     Please call our office as soon as possible. Our phone number is [phone]. Please press option 1. Our office is open 8a-12noon and 1p-5p, Monday through Friday.     Thank you,    [facility] Scheduler

## 2010-09-21 NOTE — Progress Notes (Signed)
Summary: TEST RESULTS  Phone Note Call from Patient Call back at Home Phone [phone]   Caller: Patient Reason for Call: Talk to Nurse, Lab or Test Results Initial call taken by: Lorne Skeens,  October 22, 2009 4:25 PM  Follow-up for Phone Call        Pt aware of lab results Follow-up by: Duncan Dull, RN, BSN,  October 22, 2009 4:57 PM

## 2010-09-21 NOTE — Assessment & Plan Note (Signed)
Summary: per check out/sf   Visit Type:  Follow-up  CC:  no complaints.  History of Present Illness: Antonio [name] is seen in followup for paroxysmal atrial fibrillation associated with a tachycardia-induced cardiomyopathy which has now normalized. He takes amiodarone.  He has had no recurrent palpitations of which he is aware. He denies problems with exercise intolerance chest pain shortness of breath or peripheral edema.  Blood pressures have been well controlled at home.  Amiodarone laboratories were normal as part of surveillance blood work drawn yesterday  Problems Prior to Update: 1)  Thyroid Stimulating Hormone, Abnormal  (ICD-246.9) 2)  Special Screening Malignant Neoplasm of Prostate  (ICD-V76.44) 3)  Skin Lesion  (ICD-709.9) 4)  Hyperlipidemia  (ICD-272.4) 5)  Health Screening  (ICD-V70.0) 6)  Hypertension  (ICD-401.9) 7)  Atrial Fibrillation  (ICD-427.31) 8)  Cardiomyopathy, Secondary  (ICD-425.9) 9)  Glaucoma Nos  (ICD-365.9)  Current Medications (verified): 1)  Aspirin 325 Mg  Tabs (Aspirin) .... Take One Tablet Daily 2)  One-Daily Multivitamins   Tabs (Multiple Vitamin) .... One Tablet Daily 3)  Bisoprolol Fumarate 5 Mg  Tabs (Bisoprolol Fumarate) .... 1/2 By Mouth Bid 4)  Norvasc 5 Mg  Tabs (Amlodipine Besylate) .Marland Kitchen.. 1 By Mouth Qd 5)  Azopt 1 %  Susp (Brinzolamide) .... Bid 6)  Timoptic 0.5 %  Soln (Timolol Maleate) .... Ou Qd 7)  Amiodarone Hcl 200 Mg  Tabs (Amiodarone Hcl) .Marland Kitchen.. 1 By Mouth 5 Days Per Week- Skipping Wed and Sun 8)  Docusate Sodium 100 Mg .Marland Kitchen.. 1 O Once Daily 9)  Centrum  Tabs (Multiple Vitamins-Minerals) .... Once Daily 10)  Flaxseed Oil 1000 Mg Caps (Flaxseed (Linseed)) .... Once Daily 11)  Cinnamon 500 Mg Caps (Cinnamon) .... Once Daily  Allergies (verified): No Known Drug Allergies  Past History:  Past Medical History: Last updated: 04/14/2009 HTN  CARDIOMYOPATHY, SECONDARY to Tachycadrdia     A.  ACUTE ON CHRONIC ADMIT FOR CHF 6/08  B.  CHF COUPLED WITH ATRIAL FIBRILLATION RVR ELECTROPHYSIOLOGY STUDY 09/27/07     A.  MULTIPLE FLUTTERS AND ATRIALFIB --NO ABLATION PERFORMED     B.  IBUTILIDE CARDIOVERSION     C.  AMIODARONE LOAD -- D/C IN SINUS RHYTHM     D.  COUMADIN ANTICOAGULATION  --STOPPED AT TIME OF GI BLEED 11/09 MALLORY-WEISS TEAR/ ESOPHAGITIS -- 6 UNIT HEMORRHAGE --  EPI INJECTION GLAUCOMA NOS (ICD-365.9) Atrial fibrillation appendicitis, SBO 7-09, RUPTURE WITH RLQ ABSCESS 03/16/08 Hyperlipidemia  Past Surgical History: Last updated: 06/21/2010 Hemorrhoidectomy TEE/DCCV 02/07/07 EP STUDY 09/27/07 SEE PMH EXPLORATORY LAPOROTOMY -- RUPTURED APPENDIX, RLQ ABSCESS 03/16/08 SMALL BOWEL OBSTRUDTION MALLORY-WEISS TEAR, EPINEPHRINE INJECTION  (6 UNITS LOST) -  7/09  Family History: Last updated: 04/14/2009 PERMANENT PACEMAKER--F MOTHER WITH CANCER OF THE MALE ORGANS, DETAILS UNKNOWN COLON CA--no PROSTATE CA-no DIABETES--no MI-- uncles   Social History: Last updated: 06/28/2010 WORKS AS A CABINETMAKER-- works part time  MARRIED 2 CHILDREN  REMOTE TOBACCO ETOH--no still drives very active   Risk Factors: Smoking Status: quit (02/06/2007)  Vital Signs:  Patient profile:   77 year old male Height:      74.5 inches Weight:      252.75 pounds BMI:     32.13 Pulse rate:   67 / minute BP sitting:   141 / 84  (left arm) Cuff size:   regular  Vitals Entered By: Caralee Ates CMA (June 29, 2010 10:15 AM)  Physical Exam  General:  The patient was alert and oriented in no  acute distress. HEENT Normal.  Neck veins were flat, carotids were brisk.  Lungs were clear.  Heart sounds were regular without murmurs or gallops.  Abdomen was soft with umbilical hernia with active bowel sounds. There is no clubbing cyanosis or edema. Skin Warm and dry    EKG  Procedure date:  06/29/2010  Findings:      at 64 Intervals 0.20/21 0/545 Axis is -70 Otherwise normal  Impression &  Recommendations:  Problem # 1:  ATRIAL FIBRILLATION (ICD-427.31) his atrial fibrillation appears to be quiescient  one amiodarone. We will decrease the dose from 200 mg 5 days a week to 100 mg 7 days a week. Surveillance laboratories were normal yesterday  His updated medication list for this problem includes:    Aspirin 325 Mg Tabs (Aspirin) .Marland Kitchen... Take one tablet daily    Bisoprolol Fumarate 5 Mg Tabs (Bisoprolol fumarate) .Marland Kitchen... 1/2 by mouth bid    Amiodarone Hcl 200 Mg Tabs (Amiodarone hcl) .Marland Kitchen... 1/2 pill daily  Problem # 2:  HYPERTENSION (ICD-401.9)  blood pressure is borderline elevated he'll continue to keep an eye on this we'll continue his current medications  His updated medication list for this problem includes:    Aspirin 325 Mg Tabs (Aspirin) .Marland Kitchen... Take one tablet daily    Bisoprolol Fumarate 5 Mg Tabs (Bisoprolol fumarate) .Marland Kitchen... 1/2 by mouth bid    Norvasc 5 Mg Tabs (Amlodipine besylate) .Marland Kitchen... 1 by mouth qd  Problem # 3:  CARDIOMYOPATHY, SECONDARY (ICD-425.9)  ctinue on his current medications for his results cardiomyopathy His updated medication list for this problem includes:    Aspirin 325 Mg Tabs (Aspirin) .Marland Kitchen... Take one tablet daily    Bisoprolol Fumarate 5 Mg Tabs (Bisoprolol fumarate) .Marland Kitchen... 1/2 by mouth bid    Norvasc 5 Mg Tabs (Amlodipine besylate) .Marland Kitchen... 1 by mouth qd    Amiodarone Hcl 200 Mg Tabs (Amiodarone hcl) .Marland Kitchen... 1/2 pill daily  His updated medication list for this problem includes:    Aspirin 325 Mg Tabs (Aspirin) .Marland Kitchen... Take one tablet daily    Bisoprolol Fumarate 5 Mg Tabs (Bisoprolol fumarate) .Marland Kitchen... 1/2 by mouth bid    Norvasc 5 Mg Tabs (Amlodipine besylate) .Marland Kitchen... 1 by mouth qd    Amiodarone Hcl 200 Mg Tabs (Amiodarone hcl) .Marland Kitchen... 1/2 pill daily  Patient Instructions: 1)  Your physician has recommended you make the following change in your medication: Amiodarone 100mg  daily. (1/2 pill daily) 2)  Your physician wants you to follow-up in: 1year  You will  receive a reminder letter in the mail two months in advance. If you don't receive a letter, please call our office to schedule the follow-up appointment.

## 2010-09-21 NOTE — Letter (Signed)
Summary: [facility]: Immunoassay Fecal Occult Blood (iFOB) Order Form  [facility]  [address] [place], [place] [address]   Phone: [phone]  Fax: [phone]      [facility]: Immunoassay Fecal Occult Blood (iFOB) Order Form   July 01, 2010 MRN: [id number]   Antonio [name] 1928/12/05   Physicican Name:___jose paz, md ______________________  Diagnosis Code:_______v76.51___________________      [name] CMA

## 2010-09-21 NOTE — Miscellaneous (Signed)
Summary: Flu Vaccine / [facility]  Flu Vaccine / [facility]   Imported By: Lennie Odor 06/09/2010 16:39:58  _____________________________________________________________________  External Attachment:    Type:   Image     Comment:   External Document

## 2010-09-21 NOTE — Progress Notes (Signed)
Summary: refill   Phone Note Refill Request Message from:  Fax from Pharmacy on April 28, 2010 9:02 AM  Refills Requested: Medication #1:  NORVASC 5 MG  TABS 1 by mouth qd [facility] - fax [phone]  Initial call taken by: Okey Regal Spring,  April 28, 2010 9:03 AM    Prescriptions: NORVASC 5 MG  TABS (AMLODIPINE BESYLATE) 1 by mouth qd  #90 x 3   Entered by:   [name] CMA   Authorized by:   Nolon Rod. Paz MD   Signed by:   [name] CMA on 04/28/2010   Method used:   Electronically to        [facility]* (retail)       [address] [place], [place]  [address]       Ph: [phone] or [phone]       Fax: [phone]   RxID:   [id number]

## 2010-09-21 NOTE — Progress Notes (Signed)
Summary: AMIODARONE REFILL  Phone Note Refill Request Message from:  Fax from Pharmacy on June 14, 2010 11:43 AM  Refills Requested: Medication #1:  AMIODARONE HCL 200 MG  TABS 1 by mouth 5 DAYS PER WEEK- SKIPPING WED AND SUN [facility], Shela Leff [phone]  Initial call taken by: Jerolyn Shin,  June 14, 2010 11:50 AM  Follow-up for Phone Call        Are you filling this medication or Dr.Klein now?  Follow-up by: [name] CMA,  June 14, 2010 11:55 AM  Additional Follow-up for Phone Call Additional follow up Details #1::        no, please refer to cardiology Additional Follow-up by: [facility] E. Paz MD,  June 14, 2010 4:40 PM    Additional Follow-up for Phone Call Additional follow up Details #2::    p  Prescriptions: AMIODARONE HCL 200 MG  TABS (AMIODARONE HCL) 1 by mouth 5 DAYS PER WEEK- SKIPPING WED AND SUN  #20 x 6   Entered by:   Claris Gladden RN   Authorized by:   Nathen May, MD, [facility]   Signed by:   Claris Gladden RN on 06/15/2010   Method used:   Electronically to        [facility]* (retail)       [address] [place], [place]  [address]       Ph: [phone] or [phone]       Fax: [phone]   RxID:   [id number]

## 2010-09-21 NOTE — Progress Notes (Signed)
  Phone Note Outgoing Call   Call placed by: Duncan Dull, RN, BSN,  October 14, 2009 10:11 AM Call placed to: Patient Summary of Call: Phone busy To discuss elevated TSH. Per Dr. Graciela Husbands pt will need a f/u Free T3 and Free T4. Duncan Dull, RN, BSN  October 14, 2009 10:12 AM   Follow-up for Phone Call        Pt coming in on Thurs for f/u labs Follow-up by: Duncan Dull, RN, BSN,  October 14, 2009 11:03 AM

## 2010-09-21 NOTE — Assessment & Plan Note (Signed)
Summary: cough x1 month//lch   Vital Signs:  Patient profile:   77 year old male Weight:      253.25 pounds O2 Sat:      95 % on Room air Temp:     98.3 degrees F oral Pulse rate:   72 / minute Pulse rhythm:   regular BP sitting:   128 / 86  (left arm) Cuff size:   large  Vitals Entered By: [name] CMA (June 21, 2010 2:00 PM)  O2 Flow:  Room air CC: Pt here c/o chest congestion x 1 month Comments [facility]   History of Present Illness: 2 weeks ago, developed a mild cough and postnasal dripping. He had a little  sputum amount but none in the  last 24 hours. For the last 2 nights he has been unable to sleep well due to the cough  Review of systems Denies fevers No nausea vomiting He does have some sinus congestion but no chest congestion or wheezing.  Current Medications (verified): 1)  Aspirin 325 Mg  Tabs (Aspirin) .... Take One Tablet Daily 2)  One-Daily Multivitamins   Tabs (Multiple Vitamin) .... One Tablet Daily 3)  Bisoprolol Fumarate 5 Mg  Tabs (Bisoprolol Fumarate) .... 1/2 By Mouth Bid 4)  Norvasc 5 Mg  Tabs (Amlodipine Besylate) .Marland Kitchen.. 1 By Mouth Qd 5)  Azopt 1 %  Susp (Brinzolamide) .... Bid 6)  Timoptic 0.5 %  Soln (Timolol Maleate) .... Ou Qd 7)  Amiodarone Hcl 200 Mg  Tabs (Amiodarone Hcl) .Marland Kitchen.. 1 By Mouth 5 Days Per Week- Skipping Wed and Sun 8)  Docusate Sodium 100 Mg .Marland Kitchen.. 1 O Once Daily  Allergies (verified): No Known Drug Allergies  Past History:  Past Medical History: Reviewed history from 04/14/2009 and no changes required. HTN  CARDIOMYOPATHY, SECONDARY to Tachycadrdia     A.  ACUTE ON CHRONIC ADMIT FOR CHF 6/08     B.  CHF COUPLED WITH ATRIAL FIBRILLATION RVR ELECTROPHYSIOLOGY STUDY 09/27/07     A.  MULTIPLE FLUTTERS AND ATRIALFIB --NO ABLATION PERFORMED     B.  IBUTILIDE CARDIOVERSION     C.  AMIODARONE LOAD -- D/C IN SINUS RHYTHM     D.  COUMADIN ANTICOAGULATION  --STOPPED AT TIME OF GI BLEED 11/09 MALLORY-WEISS TEAR/  ESOPHAGITIS -- 6 UNIT HEMORRHAGE --  EPI INJECTION GLAUCOMA NOS (ICD-365.9) Atrial fibrillation appendicitis, SBO 7-09, RUPTURE WITH RLQ ABSCESS 03/16/08 Hyperlipidemia  Past Surgical History: Hemorrhoidectomy TEE/DCCV 02/07/07 EP STUDY 09/27/07 SEE PMH EXPLORATORY LAPOROTOMY -- RUPTURED APPENDIX, RLQ ABSCESS 03/16/08 SMALL BOWEL OBSTRUDTION MALLORY-WEISS TEAR, EPINEPHRINE INJECTION  (6 UNITS LOST) -  7/09  Social History: Reviewed history from 04/14/2009 and no changes required. 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 no distress Head:  face symmetric, nontender to palpation Ears:  R ear normal and L ear normal.   Nose:  slightly congested Mouth:  no redness or discharge Lungs:  normal respiratory effort, no intercostal retractions, no accessory muscle use, and normal breath sounds.   Heart:  normal rate, seems to be on regular rhythm, and no murmur.     Impression & Recommendations:  Problem # 1:  URI (ICD-465.9) see  instructions, if not better in a few days, he will start antibiotics His updated medication list for this problem includes:    Aspirin 325 Mg Tabs (Aspirin) .Marland Kitchen... Take one tablet daily  Hydrocodone-homatropine 5-1.5 Mg/80ml Syrp (Hydrocodone-homatropine) .Marland Kitchen... 1 teaspoon 3 times a day as needed for cough  Complete Medication List: 1)  Aspirin 325 Mg Tabs (Aspirin) .... Take one tablet daily 2)  One-daily Multivitamins Tabs (Multiple vitamin) .... One tablet daily 3)  Bisoprolol Fumarate 5 Mg Tabs (Bisoprolol fumarate) .... 1/2 by mouth bid 4)  Norvasc 5 Mg Tabs (Amlodipine besylate) .Marland Kitchen.. 1 by mouth qd 5)  Azopt 1 % Susp (Brinzolamide) .... Bid 6)  Timoptic 0.5 % Soln (Timolol maleate) .... Ou qd 7)  Amiodarone Hcl 200 Mg Tabs (Amiodarone hcl) .Marland Kitchen.. 1 by mouth 5 days per week- skipping wed and sun 8)  Docusate Sodium 100 Mg  .Marland Kitchen.. 1 o once  daily 9)  Amoxicillin 500 Mg Tabs (Amoxicillin) .... 2 by mouth twice a day for one week 10)  Hydrocodone-homatropine 5-1.5 Mg/66ml Syrp (Hydrocodone-homatropine) .Marland Kitchen.. 1 teaspoon 3 times a day as needed for cough  Patient Instructions: 1)  rest, fluids 2)  Nasonex 2 sprays in each side of the nose daily until the samples are gone 3)  Robitussin-DM over-the-counter as needed for cough 4)  If the cough is severe, you can take hydrocodone. Will make you  sleepy. 5)  If you're not better in the next 4 days, go ahead and start amoxicillin, an  antibiotic 6)  call if no better in 10 days  Prescriptions: AMOXICILLIN 500 MG TABS (AMOXICILLIN) 2 by mouth twice a day for one week  #28 x 0   Entered and Authorized by:   Nolon Rod. Paz MD   Signed by:   Nolon Rod. Paz MD on 06/21/2010   Method used:   Print then Give to Patient   RxID:   [id number] HYDROCODONE-HOMATROPINE 5-1.5 MG/5ML SYRP (HYDROCODONE-HOMATROPINE) 1 teaspoon 3 times a day as needed for cough  #120 cc x 0   Entered and Authorized by:   Nolon Rod. Paz MD   Signed by:   Nolon Rod. Paz MD on 06/21/2010   Method used:   Print then Give to Patient   RxID:   [id number]    Orders Added: 1)  Est. Patient Level III [29528]

## 2010-09-21 NOTE — Assessment & Plan Note (Signed)
Summary: FASTING--YEARLY--PH   Vital Signs:  Patient profile:   77 year old male Height:      74.5 inches Weight:      252.38 pounds Pulse rate:   76 / minute Pulse rhythm:   regular BP sitting:   118 / 70  (left arm) Cuff size:   large  Vitals Entered By: [name] CMA (June 28, 2010 8:00 AM) CC: CPX, fasting Comments td today [facility] does not get colonscopy   History of Present Illness: Here for Medicare AWV:  1.   Risk factors based on Past M, S, F history: reviewed 2.   Physical Activities: active, still works PT Radiation protection practitioner ) 3.   Depression/mood: denies, no problems noted  4.   Hearing: decreased per pt, however  no problems noted  , see a/p  5.   ADL's: totally independent  6.   Fall Risk: low risk, no recent falls  7.   Home Safety: does feel safe at home 8.   Height, weight, &visual acuity: see VS, vision ok, s/p cataract surgery, has yearly exam soon 9.   Counseling: yes , see below  10.   Labs ordered based on risk factors: yes 11.           Referral Coordination, if needed  12.           Care Plan, see a/p  13.            Cognitive Assessment , motor skills, cognition and memory seem appropriate  In addition we discussed the following issues likes some moles in the back   HTN --  ambulatory BPs in the 120/70s, is checked frecuently   Afib, still on amiodarone (will need TFTs checked)  h/o  MALLORY-WEISS TEAR/ ESOPHAGITIS-- asx    Hyperlipidemia-- on diet only     Current Medications (verified): 1)  Aspirin 325 Mg  Tabs (Aspirin) .... Take One Tablet Daily 2)  One-Daily Multivitamins   Tabs (Multiple Vitamin) .... One Tablet Daily 3)  Bisoprolol Fumarate 5 Mg  Tabs (Bisoprolol Fumarate) .... 1/2 By Mouth Bid 4)  Norvasc 5 Mg  Tabs (Amlodipine Besylate) .Marland Kitchen.. 1 By Mouth Qd 5)  Azopt 1 %  Susp (Brinzolamide) .... Bid 6)  Timoptic 0.5 %  Soln (Timolol Maleate) .... Ou Qd 7)  Amiodarone Hcl 200 Mg  Tabs (Amiodarone Hcl) .Marland Kitchen.. 1 By  Mouth 5 Days Per Week- Skipping Wed and Sun 8)  Docusate Sodium 100 Mg .Marland Kitchen.. 1 O Once Daily 9)  Hydrocodone-Homatropine 5-1.5 Mg/72ml Syrp (Hydrocodone-Homatropine) .Marland Kitchen.. 1 Teaspoon 3 Times A Day As Needed For Cough  Allergies (verified): No Known Drug Allergies  Past History:  Past Medical History: Reviewed history from 04/14/2009 and no changes required. HTN  CARDIOMYOPATHY, SECONDARY to Tachycadrdia     A.  ACUTE ON CHRONIC ADMIT FOR CHF 6/08     B.  CHF COUPLED WITH ATRIAL FIBRILLATION RVR ELECTROPHYSIOLOGY STUDY 09/27/07     A.  MULTIPLE FLUTTERS AND ATRIALFIB --NO ABLATION PERFORMED     B.  IBUTILIDE CARDIOVERSION     C.  AMIODARONE LOAD -- D/C IN SINUS RHYTHM     D.  COUMADIN ANTICOAGULATION  --STOPPED AT TIME OF GI BLEED 11/09 MALLORY-WEISS TEAR/ ESOPHAGITIS -- 6 UNIT HEMORRHAGE --  EPI INJECTION GLAUCOMA NOS (ICD-365.9) Atrial fibrillation appendicitis, SBO 7-09, RUPTURE WITH RLQ ABSCESS 03/16/08 Hyperlipidemia  Past Surgical History: Reviewed history from 06/21/2010 and no changes required. Hemorrhoidectomy TEE/DCCV 02/07/07 EP STUDY  09/27/07 SEE [facility] EXPLORATORY LAPOROTOMY -- RUPTURED APPENDIX, RLQ ABSCESS 03/16/08 SMALL BOWEL OBSTRUDTION MALLORY-WEISS TEAR, EPINEPHRINE INJECTION  (6 UNITS LOST) -  7/09  Family History: Reviewed history from 04/14/2009 and no changes required. PERMANENT PACEMAKER--F MOTHER WITH CANCER OF THE MALE ORGANS, DETAILS UNKNOWN COLON CA--no PROSTATE CA-no DIABETES--no MI-- uncles   Social History: WORKS AS A CABINETMAKER-- works part time  MARRIED 2 CHILDREN  REMOTE TOBACCO ETOH--no still drives very active   Review of Systems General:  Denies fatigue, fever, and weight loss. CV:  Denies chest pain or discomfort, palpitations, and swelling of feet. Resp:  Denies shortness of breath; cough improved since last OV note . GI:  Denies bloody stools, nausea, and vomiting. GU:  Denies dysuria, hematuria, urinary frequency, and urinary  hesitancy.  Physical Exam  General:  alert and well-developed.   Ears:  R ear normal.  some wax in the left ear Lungs:  normal respiratory effort, no intercostal retractions, no accessory muscle use, and normal breath sounds.   Heart:  normal rate, seems to be on regular rhythm, and no murmur.   Abdomen:  soft and non-tender.  has a large midline hernia  below>above umbilicus , reducible, no tender  Rectal:  No external abnormalities noted. Normal sphincter tone. No rectal masses or tenderness. no stools found Prostate:  Prostate gland firm and smooth, no enlargement, nodularity, tenderness, mass, asymmetry or induration. Extremities:  no lower extremity edema Neurologic:  alert & oriented X3, cranial nerves II-XII intact, and gait normal.   Psych:  Oriented X3, memory intact for recent and remote, normally interactive, good eye contact, not anxious appearing, and not depressed appearing.     Impression & Recommendations:  Problem # 1:  HEALTH SCREENING (ICD-V70.0)  Td today got a flu  shot pneumonia shot 2009   shingles inmunization-- printed material provided regards    pt has never had colonoscopy & is "not interested"; benefits of a colonoscopy discussed iFOB neg 03-2009 iFOB provided   Discussed a referral to an audiologist---- will call if interested   diet and exercise discussed check a PSA  Orders: Medicare -1st Annual Wellness Visit [id number])  Problem # 2:  SKIN LESION (ICD-709.9) several skin lesion in the back, c/w seborrheic keratosis we agreed that wife will monitor lesions, call if changes, irritation, change in color   Problem # 3:  HYPERLIPIDEMIA (ICD-272.4) on diet only, labs  Labs Reviewed: SGOT: 26 (04/14/2009)   SGPT: 29 (04/14/2009)   HDL:34.80 (04/14/2009), 32.9 (11/05/2007)  LDL:DEL (11/05/2007)  Chol:208 (04/14/2009), 210 (11/05/2007)  Trig:228.0 (04/14/2009), 162 (11/05/2007)  Orders: TLB-Lipid Panel (80061-LIPID) TLB-ALT (SGPT)  (84460-ALT) TLB-AST (SGOT) (84450-SGOT) Specimen Handling (32440)  Problem # 4:  HYPERTENSION (ICD-401.9) at goal  His updated medication list for this problem includes:    Bisoprolol Fumarate 5 Mg Tabs (Bisoprolol fumarate) .Marland Kitchen... 1/2 by mouth bid    Norvasc 5 Mg Tabs (Amlodipine besylate) .Marland Kitchen... 1 by mouth qd  BP today: 118/70 Prior BP: 128/86 (06/21/2010)  Labs Reviewed: K+: 4.9 (12/30/2008) Creat: : 1.1 (12/30/2008)   Chol: 208 (04/14/2009)   HDL: 34.80 (04/14/2009)   LDL: DEL (11/05/2007)   TG: 228.0 (04/14/2009)  Orders: TLB-BMP (Basic Metabolic Panel-BMET) (80048-METABOL) TLB-CBC Platelet - w/Differential (85025-CBCD) Specimen Handling (10272)  Problem # 5:  ATRIAL FIBRILLATION (ICD-427.31) seems to be on normal sinus rhythm. Check TFTs, patient on amiodarone  His updated medication list for this problem includes:    Aspirin 325 Mg Tabs (Aspirin) .Marland Kitchen... Take one tablet daily  Bisoprolol Fumarate 5 Mg Tabs (Bisoprolol fumarate) .Marland Kitchen... 1/2 by mouth bid    Norvasc 5 Mg Tabs (Amlodipine besylate) .Marland Kitchen... 1 by mouth qd    Amiodarone Hcl 200 Mg Tabs (Amiodarone hcl) .Marland Kitchen... 1 by mouth 5 days per week- skipping wed and sun  Complete Medication List: 1)  Aspirin 325 Mg Tabs (Aspirin) .... Take one tablet daily 2)  One-daily Multivitamins Tabs (Multiple vitamin) .... One tablet daily 3)  Bisoprolol Fumarate 5 Mg Tabs (Bisoprolol fumarate) .... 1/2 by mouth bid 4)  Norvasc 5 Mg Tabs (Amlodipine besylate) .Marland Kitchen.. 1 by mouth qd 5)  Azopt 1 % Susp (Brinzolamide) .... Bid 6)  Timoptic 0.5 % Soln (Timolol maleate) .... Ou qd 7)  Amiodarone Hcl 200 Mg Tabs (Amiodarone hcl) .Marland Kitchen.. 1 by mouth 5 days per week- skipping wed and sun 8)  Docusate Sodium 100 Mg  .Marland Kitchen.. 1 o once daily 9)  Hydrocodone-homatropine 5-1.5 Mg/88ml Syrp (Hydrocodone-homatropine) .Marland Kitchen.. 1 teaspoon 3 times a day as needed for cough  Other Orders: Tdap => 31yrs IM (78295) Admin 1st Vaccine (62130) TLB-PSA (Prostate Specific  Antigen) (84153-PSA) TLB-T3, Free (Triiodothyronine) (84481-T3FREE) TLB-T4 (Thyrox), Free [phone]) TLB-TSH (Thyroid Stimulating Hormone) (84443-TSH)  Patient Instructions: 1)  Please schedule a follow-up appointment in 6 months .    Orders Added: 1)  Tdap => 23yrs IM [90715] 2)  Admin 1st Vaccine [90471] 3)  TLB-PSA (Prostate Specific Antigen) [84153-PSA] 4)  TLB-Lipid Panel [80061-LIPID] 5)  TLB-BMP (Basic Metabolic Panel-BMET) [80048-METABOL] 6)  TLB-ALT (SGPT) [84460-ALT] 7)  TLB-AST (SGOT) [84450-SGOT] 8)  TLB-CBC Platelet - w/Differential [85025-CBCD] 9)  TLB-T3, Free (Triiodothyronine) [29528-U1LKGM] 10)  TLB-T4 (Thyrox), Free [01027-OZ3G] 11)  TLB-TSH (Thyroid Stimulating Hormone) [84443-TSH] 12)  Specimen Handling [99000] 13)  Est. Patient Level III [64403] 14)  Medicare -1st Annual Wellness Visit [G0438]   Immunization History:  Influenza Immunization History:    Influenza:  historical (06/07/2010)  Immunizations Administered:  Tetanus Vaccine:    Vaccine Type: Tdap    Site: left deltoid    Mfr: GlaxoSmithKline    Dose: 0.5 ml    Route: IM    Given by: [name] CMA    Exp. Date: 06/10/2012    Lot #: [id number]   Immunization History:  Influenza Immunization History:    Influenza:  Historical (06/07/2010)  Immunizations Administered:  Tetanus Vaccine:    Vaccine Type: Tdap    Site: left deltoid    Mfr: GlaxoSmithKline    Dose: 0.5 ml    Route: IM    Given by: [name] CMA    Exp. Date: 06/10/2012    Lot #: [id number]

## 2010-12-23 ENCOUNTER — Encounter: Payer: Self-pay | Admitting: Internal Medicine

## 2010-12-27 ENCOUNTER — Encounter: Payer: Self-pay | Admitting: Internal Medicine

## 2010-12-27 ENCOUNTER — Ambulatory Visit (INDEPENDENT_AMBULATORY_CARE_PROVIDER_SITE_OTHER): Payer: Medicare Other | Admitting: Internal Medicine

## 2010-12-27 DIAGNOSIS — I10 Essential (primary) hypertension: Secondary | ICD-10-CM

## 2010-12-27 DIAGNOSIS — E079 Disorder of thyroid, unspecified: Secondary | ICD-10-CM

## 2010-12-27 DIAGNOSIS — E785 Hyperlipidemia, unspecified: Secondary | ICD-10-CM

## 2010-12-27 DIAGNOSIS — I4891 Unspecified atrial fibrillation: Secondary | ICD-10-CM

## 2010-12-27 LAB — BASIC METABOLIC PANEL
BUN: 15 mg/dL (ref 6–23)
Chloride: 107 mEq/L (ref 96–112)
Creatinine, Ser: 1 mg/dL (ref 0.4–1.5)
GFR: 77.42 mL/min (ref >60.00)
Potassium: 4 mEq/L (ref 3.5–5.1)

## 2010-12-27 LAB — TSH: TSH: 5.27 u[IU]/mL (ref 0.35–5.50)

## 2010-12-27 NOTE — Assessment & Plan Note (Addendum)
On no meds, again reluctant to take meds

## 2010-12-27 NOTE — Assessment & Plan Note (Signed)
Stable, on aspirin and amiodarone.

## 2010-12-27 NOTE — Assessment & Plan Note (Signed)
Monitor his TSH

## 2010-12-27 NOTE — Patient Instructions (Signed)
Check the  blood pressure 2 or 3 times a week, be sure it is less than 140/85. If it is consistently higher, let me know  

## 2010-12-27 NOTE — Assessment & Plan Note (Addendum)
BP slightly elevated today. Good amb BPs See instructions

## 2010-12-27 NOTE — Progress Notes (Signed)
  Subjective:    Patient ID: Antonio [name], male    DOB: 1928/11/20, 77 y.o.   MRN: [id number]  HPI Routine office visit, had a physical exam November 2011. Atrial fibrillation, saw cardiology 6 months ago, they decrease the dose of amiodarone, BP was slightly elevated  Past Medical History  Diagnosis Date  . Hypertension   . Cardiomyopathy     secondary to Tachycardia. A-Acute on chronic admit for CHF 6/08. B- CHF coupled with atrial fib RVR electrophysiology study 09/27/07.  A- Multiple flutters and Atrial fib- no ablation performed b- ibutilide cardoversion c- amiordarone load- d/c in sinus rhyrthm d- coumadin anticoagulation- stopped at time of GI bleed 11/09  . Mallory - Weiss tear     esophagitis, 6 unit hemorrhage- EPI injections   . Glaucoma   . Atrial fibrillation   . Appendicitis     SBP 7/09 ruputre w/ RLQ abscess 03/16/08  . Hyperlipidemia     Past Surgical History  Procedure Date  . Hemorrhoid surgery   . Tee/dccv 02/07/07  . Ep study 09/27/07    see [facility]  . Laporotomy     exploratory, ruptured appendix, RLQ abscess  . Small bowel obstrudtion     d/t  appendicitis  . Mallory-weiss tear     epinephrine injection (6 units lost)     Review of Systems Feeling well amb BP always wnl (120/80) Still works 6 hours a day in his yard and in his shop without problems. Specifically denies chest pain or shortness of breath, no palpitations. Good medication compliance, had no problems decreasing the dose of amiodarone as indicated by cardiology.    Objective:   Physical Exam  Constitutional: He is oriented to person, place, and time. He appears well-developed and well-nourished. No distress.  HENT:  Head: Normocephalic and atraumatic.  Nose: Nose normal.  Neck: No thyromegaly present.       Normal carotid pulses  Cardiovascular: Normal rate.   No murmur heard. Pulmonary/Chest: Effort normal and breath sounds normal. No respiratory distress. He has no wheezes. He has no  rales.  Abdominal: Soft. Bowel sounds are normal. He exhibits no distension. There is no tenderness. There is no rebound.  Musculoskeletal: He exhibits no edema.  Neurological: He is alert and oriented to person, place, and time.  Skin: He is not diaphoretic.  Psychiatric: He has a normal mood and affect. His behavior is normal. Judgment and thought content normal.          Assessment & Plan:

## 2010-12-30 ENCOUNTER — Telehealth: Payer: Self-pay | Admitting: *Deleted

## 2010-12-30 NOTE — Telephone Encounter (Signed)
Message left for patient to return my call.  

## 2010-12-30 NOTE — Telephone Encounter (Signed)
I spoke w/ pt he is aware.  

## 2010-12-30 NOTE — Telephone Encounter (Signed)
Message copied by [name] on Thu Dec 30, 2010  8:58 AM ------      Message from: Antonio [name]      Created: Wed Dec 29, 2010  5:08 PM       The patient is doing very well with a slightly elevated TSH.      Advise patient:      Labs within normal. Good result

## 2011-01-04 NOTE — Op Note (Signed)
Antonio [name], Antonio NO.:  [id number]   MEDICAL RECORD NO.:  [id number]          PATIENT TYPE:  INP   LOCATION:  2034                         FACILITY:  [facility]   PHYSICIAN:  Jonelle Sidle, MD DATE OF BIRTH:  Jan 01, 1929   DATE OF PROCEDURE:  02/07/2007  DATE OF DISCHARGE:  02/14/2007                               OPERATIVE REPORT   INDICATIONS:  [name] is a 77 year old male referred for a  transesophageal echocardiogram guided cardioversion on the date above.  The transesophageal echocardiogram report is noted elsewhere and  revealed no obvious left atrial appendage or right atrial appendage  thrombus. The left ventricular ejection fraction was approximately 30%,  and spontaneous echocontrast was also noted in the atria. The potential  risks and benefits were explained to the patient in advance, and  informed consent was obtained.   With the assistance of the anesthesia service (see their formal report),  adequate sedation was obtained. Anterior and posterior pads were placed.  Using a biphasic defibrillator, 2 separate shocks were delivered at 150  joules, each with restoration of normal sinus rhythm, although with  fairly rapid return to atrial flutter after only a few minutes. The  patient remained hemodynamically stable throughout.   DISPOSITION:  At the time of the procedure, the results were discussed  with Dr. Graciela Husbands. Plan at that point was an inpatient consultation for  possible atrial flutter ablation.      Jonelle Sidle, MD  Electronically Signed     SGM/MEDQ  D:  05/03/2007  T:  05/03/2007  Job:  [id number]   cc:   Duke Salvia, MD, [facility]

## 2011-01-04 NOTE — Assessment & Plan Note (Signed)
[facility]                         ELECTROPHYSIOLOGY OFFICE NOTE   ASCENCION, COYE                        MRN:          [id number]  DATE:04/10/2007                            DOB:          Oct 24, 1928    HISTORY:  Mr. Pitstick is seen.  He had atrial fibrillation and atrial  flutter and tachycardia-induced cardiomyopathy, which by echocardiogram  last week demonstrated normalization with left ventricular systolic  function.  He is tolerating the amiodarone without side effects.   The amiodarone surveillance labs obtained at one month demonstrated  normal TSH and normal liver function tests.   MEDICATIONS:  1. Lisinopril.  2. Digoxin 0.0625 mg with a level of less than 0.1.  3. Bisoprolol 2.5 mg twice daily.  4. Coumadin.  5. Aspirin.   PHYSICAL EXAMINATION:  VITAL SIGNS:  Blood pressure today 130/80, pulse  56.  LUNGS:  Clear.  HEART:  Sounds regular.  EXTREMITIES:  Without edema.   Electrocardiogram demonstrated today a rate of 56 as noted, with a rate  interval of 0.20/0.11/0.45.  The axis was bilaterally leftward at -45.   IMPRESSION:  1. Paroxysmal atrial fibrillation and flutter.  2. Tachycardia-induced cardiomyopathy with now normalization.  3. Amiodarone for number one.   PLAN:  1. Will plan to have him come back in four weeks and see Maple Mirza, P.A., at which time he will have his amiodarone      surveillance labs, including TSH, liver function tests and a CBC.  2. I will plan to see him in four months, after he gets back from [place].  3. Will decrease his amiodarone from 400 mg to 200 mg daily.     Duke Salvia, MD, [facility]  Electronically Signed   SCK/MedQ  DD: 04/10/2007  DT: 04/11/2007  Job #: [id number]   cc:   Willow Ora, MD

## 2011-01-04 NOTE — Assessment & Plan Note (Signed)
[name]                            CARDIOLOGY OFFICE NOTE   NAME:SIMMONSNiclas, Antonio [name]                        MRN:          [id number]  DATE:03/08/2007                            DOB:          1929-03-13    PRESENTING CIRCUMSTANCE:   I feel much, much, better.   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 Ejection fraction determined to be about 30%.  At that time he has having dyspnea, orthopnea, and paroxysmal nocturnal  dyspnea which had been progressive over several weeks. It was found that  he had atrial fibrillation, rapid ventricular rate. During that  hospitalization in June, he had transesophageal echocardiogram,  attempted cardioversion which did not convert. He was then seen by Dr.  Sherryl Manges. He had electrophysiology study. The study showed multiple  flutters as well as atrial fibrillation. No radiofrequency or catheter  ablation was attempted. Patient was converted actually on IV ibutilide  to sinus rhythm. He was started on IV amiodarone. He has had amiodarone  load and has been on amiodarone ever since discharge on June 25. The  patient says he is back to his baseline of activity and that he does not  have dyspnea, paroxysmal dyspnea, or orthopnea. 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 He had an electrocardiogram  done today at office visit July 17. He is in sinus rhythm with a rate of  60, PR interval  is 216, QRS is 106.   PHYSICAL EXAMINATION:  He is alert and oriented x3, very good sprits.  LUNGS: Clear to auscultation bilaterally.  HEART: Regular rate and rhythm without murmur.  ABDOMEN: Soft, nondistended.  The patient has kept a log of his weight. His weight has been incredibly  stable, averaging between 246 and 251. On average his weight is actually  declining a little bit.  Today is was 246.8. His blood pressure today was  126/67 and his heart rate was 60 as mentioned above.   His medications consist of;  1. Amiodarone 200 mg tablets 2 tablets daily.  2. Lisinopril 40 mg daily.  3. Digoxin 0.125 mg one half tablet daily.  4. Bisoprolol 2.5 mg twice daily.  5. Coumadin his dose right now from the Coumadin clinic 5 mg daily,      2.5 mg on Tuesday.  6. Aspirin 325 mg daily.  7. Azopt to the right eye twice daily.  8. Timolol both eyes daily.  9. Multivitamin daily.  10.Calcium 600 mg daily.   PAST MEDICAL HISTORY:  Also includes hypertension and glaucoma.   The plan going forward is the patient is to keep weighing himself and  keeping a record of his blood pressure. He also will continue to present  to the Coumadin clinic as they instruct. He had blood work done today  July 17, digoxin level, a CMET to check for liver function panel, and  potassium, as well as a thyroid stimulating hormone  study.     The plan going forward:  He presents for echocardiogram here August 11  at 11:30. He will see Dr. Graciela Husbands August 19 at 1500 hours, and if any labs  are uncertain or abnormal they will give him a call. He is also asked to  call if he has any trouble specifically if he finds himself in an  irregular rhythm with shortness of breath.     Maple Mirza, PA  Electronically Signed    GM/MedQ  DD: 03/08/2007  DT: 03/09/2007  Job #: [id number]

## 2011-01-04 NOTE — Consult Note (Signed)
Antonio, [name] NO.:  [id number]   MEDICAL RECORD NO.:  [id number]          PATIENT TYPE:  INP   LOCATION:  4737                         FACILITY:  [facility]   PHYSICIAN:  Duke Salvia, MD, FACCDATE OF BIRTH:  July 24, 1929   DATE OF CONSULTATION:  DATE OF DISCHARGE:                                 CONSULTATION   REFERRING PHYSICIAN:  Dr. Jonelle Sidle.   REASON FOR CONSULTATION:  Antonio [name] is seen at the request of Dr.  Diona Browner for management of atrial flutter.   Antonio [name] is a 77 year old gentleman who has been healthy for the long-  term, until about 6 weeks ago, where he developed a cold.  In the wake  of that, he had some exercise intolerance and noted that on his home  blood pressure machine, his heart rates were the 120-130 range.  Over  the last 6 weeks, he has had progressive symptoms of shortness of breath  until he had orthopnea and nocturnal dyspnea over the weekend.  He has  had no peripheral edema.  He presented to his physician on Tuesday, who  noted atrial flutter with a rapid ventricular response and referred him  to [facility].   He underwent TE-guided attempt at cardioversion yesterday.  His ejection  fraction was noted to be 30%, but cardioversion on multiple attempts  failed to hold sinus rhythm.   His thromboembolic risk factors are notable for hypertension and heart  failure with ejection fraction of 30% and his age.   PAST MEDICAL HISTORY:  In addition to above is notable for glaucoma.   PAST SURGICAL HISTORY:  Negative.   SOCIAL HISTORY:  He is married.  He has 4 children.  He has  grandchildren including a daughter in [place].  He works as a  Archivist.   CURRENT MEDICATIONS:  Diltiazem, metoprolol, Timoptic and aspirin, as  well as intravenous heparin and Coumadin just initiated.   EXAMINATION:  VITAL SIGNS:  His blood pressure was 141/95 with a pulse  of 59.  HEENT:  Exam demonstrated no icterus or  xanthomata.  NECK:  Veins were flat.  The carotids were brisk and full bilaterally  without bruits.  BACK:  Without kyphosis or scoliosis.  LUNGS:  Clear.  HEART:  Sounds were rapid and irregular.  ABDOMEN:  Soft and protuberant.  EXTREMITIES:  Femoral pulses were not examined.  Distal pulses were  intact.  There was no clubbing, cyanosis or edema.  NEUROLOGICAL:  Exam  was grossly normal.  SKIN:  Warm and dry.   Electrocardiogram dated June 17 demonstrated atrial flutter that  appeared to be typical with 2:1 conduction.   His electrocardiogram dated June 18 demonstrated atrial flutter with a  different P wave, which is probably reverse typical.   IMPRESSION:  1. Atrial flutter, typical and probably reverse typical, duration of      about 6 weeks with failure to cardiovert.  2. Cardiomyopathy hopefully tachycardia-mediated, although there is      antecedent virus.  3. Thromboembolic risk factors notable for:  a.     Hypertension.      b.     Congestive heart failure.      c.     Age.  4. Glaucoma.   DISCUSSION:  Antonio [name] had atrial flutter with multiple thromboembolic  risk factors.  He has also not tolerated it from a congestive heart  failure point of view.  Restoration of sinus rhythm is I think  imperative for preservation of his heart muscle function in hopes that  his myopathy is reversible.  The function of this was not able to be  accomplished electrically, as he had resumption of flutter.   I have reviewed with the family the potential benefits well as potential  risks associated with catheter ablation of atrial flutter, including,  but not limited to death, perforation, heart block requiring pacemaker  implantation, vascular injury, particularly as the procedure will be  done on heparin, given the patient's ongoing arrhythmia.  They  understand these risks and would like to proceed.   We will plan to try to proceed later today.      Duke Salvia,  MD, [facility]  Electronically Signed     SCK/MEDQ  D:  02/08/2007  T:  02/08/2007  Job:  [id number]   cc:   Dr. Alwyn Ren

## 2011-01-04 NOTE — Assessment & Plan Note (Signed)
[facility]                         ELECTROPHYSIOLOGY OFFICE NOTE   BODIN, GORKA                        MRN:          [id number]  DATE:05/10/2007                            DOB:          [birth year]    Mr. Hamberger is a 77 year old male.  He was seen first in June 2008 with  acute-on-chronic congestive heart failure.  He had dyspnea, orthopnea,  paroxysmal nocturnal dyspnea.  He had an echocardiogram in June 2008,  which showed an ejection fraction of 30%.  His presentation also  included atrial fibrillation with rapid ventricular rate.  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  He  was tried with cardioversion and failed.  He has an Electrophysiology  study, which showed multiple atrial flutters and atrial fibrillation.  He then underwent amiodarone loading with ibutilide cardioversion to  sinus rhythm.  He has been successful with his amiodarone therapy.  He  had a followup echocardiogram April 02, 2007.  Ejection fraction 55% to  65%, which showed rebound.  No mitral regurgitation.  Patient has had no  flutters or skipping.  No dyspnea since the conversion on ibutilide.  He  saw Dr. Graciela Husbands in August 2008, and his amiodarone dose was decreased from  400 mg daily to 200 mg daily.  He still maintains sinus rhythm.  Electrocardiogram today shows sinus rhythm with a rate of 62.  The PR  interval was 202, the QRS was 102.  Blood pressure 148/87.  He will have  a CBC, a liver function study, and TSH today, and then see Dr. Graciela Husbands in  4 months.  This appointment will be made before he discharges.  He is  going to [place] for a 2 to month period.  Prescriptions have been  written so that the patient can withdraw from the pharmacy a 65-month  supply of medications to take with him.   MEDICATIONS:  For this gentleman are:  1. Amiodarone 200 mg daily.  2. Lisinopril 40 mg daily.  3. Digoxin 0.125 mg 1/2 tablet  daily.  4. Bisoprolol 2.5 mg twice daily.  5. Coumadin 2.5 mg daily, a fairly stable dose.  6. Aspirin 325 mg daily.  7. Azopt 1 drop to the right eye twice daily.  8. Timolol 1 drop both eyes daily.  9. Multivitamin daily.  10.Calcium with magnesium supplements 600 mg daily.     Maple Mirza, PA  Electronically Signed      Rollene Rotunda, MD, [facility]  Electronically Signed   GM/MedQ  DD: 05/10/2007  DT: 05/10/2007  Job #: [id number]

## 2011-01-04 NOTE — Letter (Signed)
July 04, 2008    Willow Ora, MD  [phone] [place], [place] [address]   RE:  Antonio [name], Antonio [name]  MRN:  [id number]  /  DOB:  [birth year]   Dear Elita Quick,   Happy Thanksgiving.  Antonio [name] comes in today in followup for his  atrial fibrillation.  He has an intercurrent hospitalization notable for  a ruptured appendix, a small bowel obstruction uncomplicated by  vomiting, Mallory-Weiss tear and a 6 unit bleed.  In the wake of that,  his Coumadin was discontinued; he is reluctant to have it resumed.   His thromboembolic risk factors are notable for age over 5 and  hypertension.  He does not have diabetes, LV dysfunction, or prior  stroke.   He said you drew blood work and I assume that these were amiodarone  surveillance laboratories, and I am sure during his hospitalization in  July, all of the appropriate labs were drawn.   His medications currently include amiodarone, timolol, aspirin, and  Norvasc at 5.   On examination, his blood pressure is mildly elevated today at 140/78.  It is better than it has been, his weight was 260, which is down a few  pounds, pulse was 71.  His neck veins were flat.  His lungs were clear.  His heart sounds were regular with an S4.  The abdomen was soft with a  big scar and the extremities had no edema.   Electrocardiogram dated today demonstrated sinus rhythm at 71 with  intervals of 0.19/0.10/0.43.  The axis was leftward of -73.   IMPRESSION:  1. Atrial fibrillation.  2. 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  3. Amiodarone for atrial fibrillation.  4. CHADS-VAS score of 3  5. History of Mallory-Weiss tear status post 6 unit bleed, now off      Coumadin and reluctant to resume it.   Antonio [name], Antonio [name], is holding sinus rhythm.  The data from the phone  would suggest that Coumadin therapy is appropriate notwithstanding his  CHADS-VAS score of 3 of CHADS score of 2.  It has been estimated  annualized risk of stroke  in the 3-4% range, and Coumadin would be  recommended.  I have reviewed this with him.  He at this point remains  reluctant to take it.   I have advised him that in the event that he has recurrent atrial  fibrillation, that he should call us as soon as he knows so that we may  be able to undertake cardioversion which would be unprotected at that  time.  In the event that it last for more than 48 hours, he would need  to be covered with heparin/Coumadin.   We will see him again 1 year's time.   You may call that I have given him amiodarone surveillance card if you  could help him keep track of his amiodarone labs that would be a big  help.    Sincerely,      Duke Salvia, MD, [facility]  Electronically Signed    SCK/MedQ  DD: 07/04/2008  DT: 07/04/2008  Job #: [id number]

## 2011-01-04 NOTE — Consult Note (Signed)
NAMEEDRIAN, MELUCCI                 ACCOUNT NO.:  [id number]   MEDICAL RECORD NO.:  [id number]          PATIENT TYPE:  INP   LOCATION:  4737                         FACILITY:  [facility]   PHYSICIAN:  Noralyn Pick. Eden Emms, MD, FACCDATE OF BIRTH:  1928/09/05   DATE OF CONSULTATION:  DATE OF DISCHARGE:                                 CONSULTATION   Mr. Krisko is a 77 year old patient we are asked to see by Dr. Drue Novel.  She has been having increasing shortness of breath for about three  weeks.  He has had an occasional cough but no sputum or fever.  The  patient is a Investment banker, corporate, and he notices that when he was doing  his work, he was increasingly short of breath.  In particularly, when he  had his paint mask on, he could not get enough air.  He had noted some  rapid palpitations over the last few weeks; however, his primary symptom  has been dyspnea, and there has been no PND or orthopnea, no increase in  lower extremity edema.  He has not had previous heart problems before.   In regards to his dyspnea, it has been progressive.  It is unusual for  him, since he is fairly active.  He has no previous diagnosis of DVT,  PE, or heart failure.   The patient was seen by Dr. Drue Novel in the office and noted to be in rapid A  flutter at a rate of 150 and was admitted to the hospital.   REVIEW OF SYSTEMS:  Otherwise negative.   PAST MEDICAL HISTORY:  Unremarkable for minimal coronary risk factors.  There is no diabetes, no hypertension.  He does have hyperlipidemia.  No  family history.  No tobacco.  Patient has not had significant problems  with fluttering before.  In fact, he has not had previous heart  problems.  Past medical history is remarkable for life-scanning with  carotids, AAA screening, and lower extremity arterial studies done in  2006, which were okay.  He has a history of glaucoma.   Surgical history includes hemorrhoids and cataracts.   He has no known allergies.   FAMILY  HISTORY:  Noncontributory.   His only medications included Timoptic eye drops and aspirin daily,  vitamins, rice yeast, beta carotene.   He actively works.  He lives in [place].  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  His wife of over 40 years is with him in the room.  He quit  smoking many years ago after a 20-pack-year history.   His mother died at age 77.  Unfortunately, she had bleeding in the brain  from Coumadin, and the patient is hesitant to be on Coumadin.  Father  died of sudden death in his 80s and had a pacemaker.   PHYSICAL EXAMINATION:  VITAL SIGNS:  Remarkable for being afebrile at  97.7.  Pulse is currently 148 and in flutter.  Respiratory rate is 20.  Blood pressure is 130/98.  Sats are 96% on room air.  He is afebrile.  HEENT:  Normal.  NECK:  Carotids are normal without bruit.  There is no JVP elevation.  No thyromegaly.  No lymphadenopathy.  No carotid bruits.  LUNGS:  Clear.  CARDIAC:  There is an S1 and S2 with a soft systolic murmur.  PMI is  mildly increased but not displaced.  There is normal diaphragmatic  motion.  ABDOMEN:  Bowel sounds are positive.  He has ecchymosis on his abdomen  from a piece of wood.  There is no tenderness.  No hepatosplenomegaly.  No hepatojugular reflux.  No AAA.  Femorals are +3 without bruit.  EXTREMITIES:  There is no lower extremity edema.  He has mild  varicosities at the ankles.  NEUROLOGIC:  Unremarkable.  There is no muscular weakness.   His EKG shows atrial flutter with probable LVH.   His lab work is not back yet.   His chest x-ray shows no active disease.   IMPRESSION:  1. Mr. Knechtel has rapid atrial flutter which is causing his dyspnea.      I explained to the patient that there are three issues with his      flutter:  1)  Rate control.  2)  Anticoagulation.  3)      Cardioversion.  Unfortunately, the patient is very reticent to be      on any blood thinners.  His mother died of complications from       Coumadin.  I explained to him that if he is not willing to be on      heparin or Coumadin, that the only thing we could offer him      initially would be rate control.  For the time being, we will start      him on Cardizem 10 mg bolus with 10 mg/hr drip.  We will      subsequently switch him over to Cardizem 60 p.o. q.6h.  If the      patient allows [place] to have him on even short-term heparin and      Coumadin, we then could proceed with TEE-guided cardioversion,      otherwise since he has flutter, it may be worthwhile for EP to see      him in regards to possible TEE followed by ablation of the flutter      circuit.  2. Apparent hypertension:  Again, this should be helped by AV nodal-      blocking drugs such as Cardizem.  We will see what he runs in the      hospital.  He will be placed on a low sodium diet.  3. Cholesterol status:  Again, he is reticent to be on statin drugs.      He prefers to take red yeast rice for his hypercholesterolemia, and      we will recheck his lipids and LFTs here in the hospital.   Again, I spent quite some time with the patient and his wife explaining  the risks of stroke and TIA while in flutter and also the inability to  actively cardiovert without at least short-term heparin and Coumadin  therapy.  They seem to understand this.  I will speak to him further in  the morning regarding anticoagulation and anti-arrhythmic issues once we  see how the calcium channel blockers rate-control him.      Noralyn Pick. Eden Emms, MD, [facility]  Electronically Signed     PCN/MEDQ  D:  02/06/2007  T:  02/06/2007  Job:  [id number]

## 2011-01-04 NOTE — Consult Note (Signed)
Antonio [name], Antonio [name]                 ACCOUNT NO.:  [id number]   MEDICAL RECORD NO.:  [id number]          PATIENT TYPE:  INP   LOCATION:  5505                         FACILITY:  [facility]   PHYSICIAN:  Lennie Muckle, MD      DATE OF BIRTH:  11/17/1928   DATE OF CONSULTATION:  03/10/2008  DATE OF DISCHARGE:                                 CONSULTATION   REASON FOR CONSULT:  Abdominal pain, question diverticulitis versus  appendicitis.   HISTORY OF PRESENT ILLNESS:  Antonio [name] is a 77 year old man, who  states he had onset of abdominal pain on Thursday.  The pain was located  generally over the periumbilical area.  He continued to have the pain  throughout the day on Thursday, thought that it was related to not  having a bowel movement.  Over the course of the week, the pain  persisted and finally intensified and did seem to move down to his  suprapubic region bilaterally.  He was then went to his primary care  physician's office due to he felt it might be possibly due to his  cardiac medications.  He began to have increased abdominal distention.  He had nausea and emesis x2.  He had started to have somewhat loose  stools at home.  He also began to have more difficulty with  regurgitation of food substances at night over the past day.  He has had  some decrease in his appetite.  No hematochezia or melena.  There was  report of a low-grade fever at home.  He has had no previous episodes of  abdominal pain in this location.   PAST MEDICAL HISTORY:  Significant for congestive heart failure,  AFib/aflutter, glaucoma, and hypertension.   SURGICAL HISTORY:  Hemorrhoidectomy.   FAMILY HISTORY:  Father had heart disease and mother had gynecological  cancer.   SOCIAL HISTORY:  He used to smoke, but quit in the 1970s.  No alcohol  use.  He is married.   Medications at home includes,  1. Eye drops.  2. Aspirin.  3. Lisinopril 40 mg a day.  4. Bisoprolol.  5. Warfarin 5 mg.  6. Norvasc 5  mg.  7. Timoptic eye drops.  8. Amiodarone 200 mg.   ALLERGIES:  No drug allergies.   REVIEW OF SYSTEMS:  Per the patient's outpatient questionnaire, does  have some weakness.  No complaints of chest pain and no heart  palpitations.   On physical examination, he is a well-developed, well-nourished male,  appears his stated age.  Blood pressure is 119/73, pulse 56, temp 97.5,  respiratory rate 15, and 93% on room air.  Chest is clear to  auscultation bilaterally.  Cardiovascular exam is regular rate and  rhythm.  Abdomen is somewhat distended.  Mildly tender to palpation.  No  peritoneal signs.  No guarding.  Extremities, some ecchymotic lesions  noted on the lower extremity.  No significant edema is noted on the  extremity exam.   CT scans reviewed, there does appear to be an inflammatory process in  the right lower quadrant.  Appendix  is not well visualized and there is  a question of a fecalith.  No fluid within the pelvis and stranding in  the mesentery.   ASSESSMENT AND PLAN:  Abdominal pain for 5 days.  Differential includes  Meckel diverticulum, diverticulitis, or appendicitis.  Clinically, he  seems to be improving.  There is no acute surgical need for  intervention.  His white count is normal at 10.  His INR is elevated at  2.6, therefore a surgical procedure would be somewhat more of a risk for  bleeding tonight, I think leaving him on IV antibiotics, and continue to  monitor.  He may have a partial small bowel obstruction due to the  inflammatory process.  He may ultimately need to have resection of this  area, but we will continue to evaluate and follow clinically.  He needs  to have his Coumadin held on cardiac evaluation due to the need of  possibly general anesthetic.      Lennie Muckle, MD  Electronically Signed     ALA/MEDQ  D:  03/12/2008  T:  03/12/2008  Job:  [id number]

## 2011-01-04 NOTE — Discharge Summary (Signed)
NAMEABIE, CHEEK                 ACCOUNT NO.:  [id number]   MEDICAL RECORD NO.:  [id number]          PATIENT TYPE:  INP   LOCATION:  6707                         FACILITY:  [facility]   PHYSICIAN:  Raenette Rover. Felicity Coyer, MDDATE OF BIRTH:  08-08-1929   DATE OF ADMISSION:  03/11/2008  DATE OF DISCHARGE:  03/26/2008                               DISCHARGE SUMMARY   PRIMARY CARE PHYSICIAN:  Willow Ora, M.D.   ELECTROPHYSIOLOGIST:  Duke Salvia, MD, [facility]   DISCHARGE DIAGNOSES:  1. Small bowel obstruction secondary to ruptured appendicitis with      abscess status post exploratory laparoscopy and appendectomy on      March 16, 2008.  2. Acute blood loss anemia postoperatively and secondary to ONEOK tear during this admission.  3. History of atrial fibrillation.  4. Hypertension.   HISTORY OF PRESENT ILLNESS:  Mr. Lofaro is a 77 year old very pleasant  white male with a past medical history of hypertension and atrial  fibrillation on chronic anticoagulation.  The patient presented to his  primary care physician's office on the day of admission with reports of  abdominal pain since approximately March 06, 2008.  The patient reported  the pain started in the mid abdomen was steady cramping and now  diffusely over abdomen with no appetite and some nausea, vomiting and  diarrhea.  The patient found to have a low systolic blood pressure upon  evaluation in the office, at which time the patient was admitted for  further evaluation and treatment.  Initial workup at the time of  admission revealed proximal small bowel obstruction with questionable  intra-abdominal process, at which time surgery was asked to see the  patient in consultation who felt there was no need for emergent surgery.  The patient was placed on n.p.o. and empiric antibiotic therapy.  The  patient is being discharged today after a prolonged hospitalization.   CONSULTATIONS DURING THIS ADMISSION:  1. [place]  [place] [facility].  2. [facility].   COURSE OF HOSPITALIZATION:  1. Small bowel obstruction secondary to ruptured appendicitis with      abscess.  As mentioned above, the patient initially seen by [facility] and felt no need for emergent intervention at this      time, and the patient actually seemed to be improving with n.p.o.      status, and bowel obstruction seemingly resolved.  However, the      patient began developing nausea, vomiting, abdominal pain and      hematemesis.  NG tube was placed, and follow-up films revealed      recurrent small bowel obstruction.  The patient did undergo      exploratory lap on March 16, 2008 with appendectomy and drainage of      right lower quadrant abscess secondary to ruptured appendicitis.      At this time, the patient is tolerating a soft diet without any      recurrent nausea, vomiting or abdominal pain.  He has remained  afebrile off of antibiotic therapy greater than 24 hours.  Of note,      the patient did complete a total of 14 days of IV Unasyn during      this hospitalization.  2. [place] Artist tear status post epi injection this admit.  Upon NG      tube placement, as mentioned above, noted to be bright red blood in      NG suction, at which time gastroenterology was asked to see the      patient in consultation.  The patient did undergo EGD on March 16, 2008 with epinephrine injection to bleeding site.  EKG revealed      Mallory Weiss tear and esophagitis.  The patient has experienced no      recurrent upper GI bleeding through this hospitalization.  3. Acute blood loss anemia postoperatively and secondary to number      two.  The patient did receive several units of packed red blood      cells during this admission, in addition to fresh frozen plasma.      His hemoglobin has remained stable with no recurrent bleeding      postoperatively.  4. Atrial fibrillation on chronic anticoagulation.   The patient has      been off Coumadin since March 15, 2008 secondary to operation.  The      patient is requesting to remain off Coumadin.  Also of note, the      patient has been in sinus rhythm through this hospitalization.      Therefore, the patient to be discharged on full-dose aspirin and to      discuss continuing anticoagulation therapy with primary care      physician at follow up.  5. Hypertension.  The patient with borderline low blood pressure      throughout admission.  His ACE inhibitor will be held at time of      discharge to be resumed if needed by primary care physician.   MEDICATIONS AT TIME OF DISCHARGE:  1. Aspirin 325 mg p.o. daily.  2. Multivitamin p.o. daily.  3. Norvasc 5 mg p.o. daily.  4. Bisoprolol 2.5 mg p.o. daily.  5. Amiodarone 200 mg p.o. daily.  6. Azopt eye drops each eye b.i.d.  7. Timoptic 0.5% right eye daily.  8. The patient instructed to hold lisinopril and Coumadin to be      resumed by primary care physician.   PERTINENT LABORATORY WORK AT TIME OF DISCHARGE:  Hemoglobin 8.1,  hematocrit 24.0.  Sodium 137, potassium 4.0, BUN 14, creatinine 0.97.  Fasting lipid panel within normal limits.   DISPOSITION:  The patient felt medically stable for discharge home at  this time.  He is instructed to immediately call his primary care  physician or surgery with any recurrent abdominal pain, nausea or  vomiting.  The patient to maintain a soft diet until follow up and  further instruction per surgery.  The patient to follow up with his  primary care physician, Dr. Willow Ora, on Friday, March 28, 2008 at 10:45  a.m.  Also, the patient instructed to follow up with [facility]gery on Tuesday, April 08, 2008 at 2:30 p.m.   Greater than 30 minutes spent on discharge planning.      Cordelia Pen, NP      Raenette Rover. Felicity Coyer, MD  Electronically Signed    LE/MEDQ  D:  03/26/2008  T:  03/26/2008  Job:  [id number]   cc:   Willow Ora, MD   [facility]  Duke Salvia, MD, [facility]

## 2011-01-04 NOTE — Assessment & Plan Note (Signed)
[facility]                         ELECTROPHYSIOLOGY OFFICE NOTE   NYXON, STRUPP                        MRN:          [id number]  DATE:09/12/2007                            DOB:          [birth year]    Mr. Schlitt comes in following atrial fibrillation-induced, tachycardia-  induced cardiomyopathy with now normalization.  He is doing great.  He  went to [place], where he was there for 5 or 6 months and did  beautifully.   He has noted that his blood pressure has been up a little bit.  Today  was 175, on recheck was still 180 with a diastolic of 100.   MEDICATIONS:  1. Lisinopril 40 mg.  2. Digoxin.  3. Bisoprolol 2.5 mg b.i.d.,  4. Coumadin.  5. Timolol.  6. Amiodarone at 200 mg a day.   EXAMINATION:  His blood pressure was as noted.  His pulse was 67.  LUNGS:  Clear.  Heart sounds were regular.  EXTREMITIES:  Without edema.  NEUROLOGIC:  Grossly normal.   Electrocardiogram today demonstrated sinus rhythm at 65 with intervals  of 0.20/0.09/0.44.  The axis was leftward at -52.   IMPRESSION:  1. Atrial fibrillation.  2. Tachycardia-induced cardiomyopathy.  3. Normalization of the above.  4. Amiodarone.  5. Hypertension, systolic.   Mr. Vandervliet is doing well.  We will plan to check his amiodarone  surveillance laboratories.  I have given an amiodarone surveillance card  today.  We will check his TSH, LFTs and his CBC.   We will also begin him on Norvasc at 5 mg.  I will ask that he follow up  with Dr. Drue Novel in the next month or so to get his blood pressure  reassessed.   Will see him again in six months' time.     Duke Salvia, MD, [facility]  Electronically Signed    SCK/MedQ  DD: 09/12/2007  DT: 09/12/2007  Job #: [id number]   cc:   Willow Ora, MD

## 2011-01-04 NOTE — Op Note (Signed)
Antonio [name], Antonio [name]                 ACCOUNT NO.:  [id number]   MEDICAL RECORD NO.:  [id number]          PATIENT TYPE:  INP   LOCATION:  2109                         FACILITY:  [facility]   PHYSICIAN:  Cherylynn Ridges, M.D.    DATE OF BIRTH:  Jun 28, 1929   DATE OF PROCEDURE:  03/16/2008  DATE OF DISCHARGE:                               OPERATIVE REPORT   PREOPERATIVE DIAGNOSIS:  Small bowel obstruction with pelvic  inflammatory process.   POSTOPERATIVE DIAGNOSES:  1. Small bowel obstruction.  2. Intramesenteric rupture of appendix.  3. Intramesenteric abscess.   PROCEDURE:  Exploratory laparotomy, appendectomy, and drainage of  intramesenteric abscess.   SURGEON:  Cherylynn Ridges, M.D.   ASSISTANT:  Thornton Park. Daphine Deutscher, MD.   ANESTHESIA:  General endotracheal.   ESTIMATED BLOOD LOSS:  About 200 mL.   COMPLICATIONS:  None.   CONDITION:  Fair.   SPECIMEN:  Aerobic and anaerobic cultures were sent of the abscess  cavity and also the appendix ruptured in the mesentery along with the  base of the cecum.   INDICATIONS FOR OPERATION:  The patient is a 77 year old gentleman who  has been hospitalized with nausea, vomiting, and a pelvic inflammatory  process; no previous history of abdominal surgery; who over the last  several days has not improved on IV antibiotics.  He developed Mallory-  Weiss tear from nausea and vomiting, and that has been controlled, and  now he comes to the operating room for definitive operation for his  bowel obstruction.   OPERATION:  The patient was taken to the operating room, placed on table  in supine position.  After an adequate general endotracheal anesthetic  was administered, he was prepped and draped in the usual sterile manner.   Midline incision was made from just to the right and above the umbilicus  down to the pubic crest.  We extended down more proximally as we got  into the abdominal cavity and saw this  significant process that was in  place  here.   We took the incision down to the midline fascia, then went through the  fascia into the peritoneal cavity.  There was some acidic fluid.  We  opened it all the way down through the fascia and then could palpate a  mass in the right lower quadrant.   The surgeon switched from the right side of the patient to the left side  as we explored the right lower quadrant where this palpable mass was  noted.  What it appeared to be were multiples loops of small bowel stuck  to some type of process in the right paracolic mesentery.  As we  mobilized this area, including mobilized the right colon from the line  of Toldt and rotating medially, we broke into an abscess cavity from  which aerobic and anaerobic cultures were sent.  This abscess cavity  included multiple loops of small bowel stuck to the mesentery of the  terminal ileum and also of the right colon.  Within that area was an  appendolith and also the remnants of what was  an acutely inflamed and  ruptured appendix.  We were able to isolate the base of the appendix and  staple that off at the cecum using a TX 30 stapler with 3.5 mm closer  staples.  The mesoappendix was taken with a LigaSure device.  As we  removed all evidence of remnant appendix and base of the appendix, we  irrigated this intramesenteric abscess with copious amounts about 5 L of  saline solution.  We then passed a 90-mm Blake drain into the right  lower quadrant into this fold of mesentery and the mesenteric abscess  securing it to the right lower quadrant with 3-0 nylon sutures.  Once we  had secured that in place and we had irrigated copiously, we closed the  abdomen using running #1 looped PDS suture.  The skin was left open and  packed with a saline-soaked Kerlix gauze and sterile dressing was  reapplied.  All needle counts, sponge counts, and instrument counts were  correct.      Cherylynn Ridges, M.D.  Electronically Signed     JOW/MEDQ  D:  03/16/2008   T:  03/17/2008  Job:  [id number]   cc:   Willow Ora, MD

## 2011-01-04 NOTE — Discharge Summary (Signed)
NAMEKESTON, Antonio [name] NO.:  [id number]   MEDICAL RECORD NO.:  [id number]          PATIENT TYPE:  INP   LOCATION:  2034                         FACILITY:  [facility]   PHYSICIAN:  Duke Salvia, MD, FACCDATE OF BIRTH:  1929-08-08   DATE OF ADMISSION:  02/06/2007  DATE OF DISCHARGE:  02/14/2007                               DISCHARGE SUMMARY   This patient has no known drug allergies.   The dictation greater than 45 minutes.   FINAL DIAGNOSES:  1. Admitted in acute-on-chronic congestive heart failure.  2. Orthopnea secondary to atrial fib/flutter.  3. 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   SECONDARY DIAGNOSES:  1. Glaucoma.  2. Dyslipidemia.   PROCEDURES:  1. February 07, 2007:  Transesophageal echocardiogram:  No left atrial      appendage thrombus, no right atrial appendage thrombus, ejection      fraction 30%, unable to cardiovert.  2. February 09, 2007:  Electrophysiology study:  The study showed multiple      derivations of atrial flutter, also atrial fibrillation, no      radiofrequency catheter ablation attempted, ibutilide cardioversion      to sinus rhythm; patient then started on amiodarone and has      maintained sinus rhythm.  Of note, on June 24, the patient did have      breakout to paroxysmal atrial fibrillation, rapid ventricular rate      and was treated with additional IV amiodarone converting back to      sinus rhythm.   BRIEF HISTORY:  Mr. Uselman is a 77 year old male.  He has been healthy  until about six weeks when he developed a cold.  In the wake of that, he  had some exercise intolerance.  He noted on his home blood pressure  machine that his heart rates were in 120 and 130 resting.  Over the past  six weeks, he has had progressive shortness of breath which culminated  in orthopnea and nocturnal dyspnea over the weekend prior to this  admission.  He has had no peripheral edema.  He  presented to his primary  care physician who noted atrial flutter with rapid ventricular response  and her was referred to [facility].   HOSPITAL COURSE:  The patient presented from primary care physician's  office on June 17.  He was in progressive class II-III acute-on-chronic  systolic heart failure.  He was noted to be in rapid atrial flutter.  He  underwent transesophageal echocardiogram with ejection fraction 30%;  there was no atrial thrombus and cardioversion was attempted, but the  patient was not converted during that procedure.  He was seen in  consultation by Dr. Sherryl Manges, electrophysiology.  Dr. Graciela Husbands arranged  for electrophysiology study June 20; the study showed multiple flutters  as well as atrial fibrillation, no radiofrequency catheter ablation was  attempted, however the patient was treated with IV ibutilide and  cardioverted to sinus rhythm; he was then started on IV amiodarone.  Coumadin had also been started  this hospitalization.  The patient has  maintained sinus rhythm on amiodarone except for one breakout on June  24, the day prior to this discharge.  He was treated with extra IV  amiodarone and reconverted to sinus rhythm.  He has maintained sinus  rhythm.  His Coumadin has been therapeutic for a 36-hour period at the  time of discharge, June 25.  He discharges on the following medications:   1. Amiodarone 200 mg tablets, two tabs in the morning and two tabs in      the evening for a two-week period from June 25 to July 9 then he      scales down to 200 mg tablets, two tabs daily starting July 10.  2. Lisinopril 40 mg daily.  3. Bisoprolol 2.5 mg twice daily.  4. Digoxin 0.125 mg tablets 1/2 tab daily.  5. Enteric-coated aspirin 325 mg daily.  6. Coumadin 5 mg tablets; he is to take 5 mg tablets on Wednesday,      June 25, and then to alternate 5/7.5 every other day.  7. He also has Azopt one drop in the right eye twice daily and      Timoptic  one drop both eyes daily for his glaucoma.   FOLLOWUP IS AT:  1. [facility], [address].  2. Coumadin clinic Monday, June 30, at noon.  3. He is to see the physician assistant on the day when Dr. Graciela Husbands is      in the office Thursday, July 17, at 2:00 p.m.  A B-Met and digoxin      level will be taken at that time.  4. Echocardiogram Friday, August 11, at 11:30.  5. He will see Dr. Graciela Husbands Tuesday, August 19, at 3 o'clock.   LABORATORY STUDIES PERTINENT TO THIS ADMISSION:  On June 23, the  complete blood count:  White cells are 7.2, hemoglobin 14.8, hematocrit  43.8, platelets are 170.   The serum electrolytes on the day of discharge, June 25:  Sodium 139,  potassium 4.1, chloride 104, carbonate 25, BUN is 17, creatinine 1.09  and glucose is 84.   Protime on the day of discharge is 25.3, INR is 2.2.  His troponin I  study on admission was 0.02.   His lipid profile:  Cholesterol is 134, LDL cholesterol is 82, HDL  cholesterol is 31 and triglycerides of 103.   The BNP on June 23 is 142.  BNP on June 21 is 433.      Maple Mirza, [place]      Duke Salvia, MD, [facility]  Electronically Signed    GM/MEDQ  D:  02/14/2007  T:  02/14/2007  Job:  [id number]   cc:   Willow Ora, MD

## 2011-01-04 NOTE — Op Note (Signed)
[name], Antonio [name] NO.:  [id number]   MEDICAL RECORD NO.:  [id number]           PATIENT TYPE:   LOCATION:                                 FACILITY:   PHYSICIAN:  Duke Salvia, MD, [facility]   DATE OF BIRTH:   DATE OF PROCEDURE:  DATE OF DISCHARGE:                               OPERATIVE REPORT   PREOPERATIVE DIAGNOSIS:  Atrial flutter.   POSTOPERATIVE DIAGNOSIS:  Atrial flutter.   PROCEDURE:  Invasive electrophysiological study and arrhythmia mapping.   Following obtaining informed consent, the patient was brought to the  electrophysiology laboratory and placed on the fluoroscopic table in a  supine position. After routine prep and drape, cardiac catheterization  was performed.  A 5-French quadripolar catheter was inserted via the  left femoral vein to the AV junction.  A 6-French octapolar catheter was  inserted via the right femoral vein to the coronary sinus.  A 7-French  dual decapolar catheter was inserted via the right femoral vein to the  tricuspid annulus.   The patient presented to the lab in atrial flutter with a RR interval of  473 and AA interval of 238, the QRS duration was 103 with a QT interval  323.   The patient was given the ibutilide following arrhythmia mapping.  Atrial pacing demonstrated continuous antegrade AV nodal conduction with  retrograde block.   Atrial flutters were induced.  There were a number of different atrial  flutters that were induced with variable atrial cycle length.  At that  point, the procedure was terminated and the catheters removed.  The  patient was transferred to holding area in stable condition.   It was elected to treat the patient with medications and consider  alternative therapies for his atrial flutter.  The patient tolerated the  procedure without apparent complication.      Duke Salvia, MD, [facility]  Electronically Signed     SCK/MEDQ  D:  09/27/2007  T:  09/28/2007  Job:  [id number]

## 2011-02-04 ENCOUNTER — Other Ambulatory Visit: Payer: Self-pay

## 2011-02-04 MED ORDER — AMIODARONE HCL 200 MG PO TABS: ORAL_TABLET | ORAL | Status: DC

## 2011-04-22 ENCOUNTER — Other Ambulatory Visit: Payer: Self-pay | Admitting: Internal Medicine

## 2011-04-22 MED ORDER — BISOPROLOL FUMARATE 5 MG PO TABS: ORAL_TABLET | ORAL | Status: DC

## 2011-04-22 NOTE — Telephone Encounter (Signed)
Done

## 2011-04-29 ENCOUNTER — Other Ambulatory Visit: Payer: Self-pay | Admitting: Internal Medicine

## 2011-04-29 MED ORDER — AMLODIPINE BESYLATE 5 MG PO TABS: 5.0000 mg | ORAL_TABLET | Freq: Every day | ORAL | Status: DC

## 2011-04-29 NOTE — Telephone Encounter (Signed)
Rx Done . 

## 2011-05-20 LAB — CBC
HCT: 23.6 — ABNORMAL LOW
HCT: 23.8 — ABNORMAL LOW
HCT: 38.7 — ABNORMAL LOW
HCT: 41.5
HCT: 24 — ABNORMAL LOW
Hemoglobin: 11.1 — ABNORMAL LOW
Hemoglobin: 14
Hemoglobin: 7.9 — CL
Hemoglobin: 8 — ABNORMAL LOW
Hemoglobin: 8.1 — ABNORMAL LOW
Hemoglobin: 8.2 — ABNORMAL LOW
Hemoglobin: 8.2 — ABNORMAL LOW
MCHC: 33.9
MCHC: 34.1
MCHC: 34.3
MCV: 93.3
MCV: 93.4
MCV: 94.8
MCV: 94.8
MCV: 95.4
MCV: 92.3
MCV: 94.5
Platelets: 235
Platelets: 238
Platelets: 254
Platelets: 278
Platelets: 323
RBC: 2.13 — ABNORMAL LOW
RBC: 2.42 — ABNORMAL LOW
RBC: 2.48 — ABNORMAL LOW
RBC: 2.54 — ABNORMAL LOW
RBC: 3.45 — ABNORMAL LOW
RBC: 4.35
RBC: 4.38
RBC: 2.4 — ABNORMAL LOW
RDW: 14.2
RDW: 14.3
RDW: 14.4
RDW: 14.6
WBC: 10
WBC: 11.4 — ABNORMAL HIGH
WBC: 13.7 — ABNORMAL HIGH
WBC: 15.7 — ABNORMAL HIGH
WBC: 17.8 — ABNORMAL HIGH
WBC: 18.8 — ABNORMAL HIGH
WBC: 8.1
WBC: 9.4

## 2011-05-20 LAB — COMPREHENSIVE METABOLIC PANEL
ALT: 19
ALT: 19
AST: 16
Albumin: 2 — ABNORMAL LOW
Alkaline Phosphatase: 40
Alkaline Phosphatase: 50
Alkaline Phosphatase: 97
BUN: 14
BUN: 14
CO2: 20
CO2: 25
CO2: 21
Calcium: 7.7 — ABNORMAL LOW
Chloride: 120 — ABNORMAL HIGH
Chloride: 108
Creatinine, Ser: 0.97
GFR calc Af Amer: >60
GFR calc non Af Amer: 50 — ABNORMAL LOW
GFR calc non Af Amer: >60
GFR calc non Af Amer: >60
Glucose, Bld: 109 — ABNORMAL HIGH
Glucose, Bld: 155 — ABNORMAL HIGH
Glucose, Bld: 112 — ABNORMAL HIGH
Potassium: 3.3 — ABNORMAL LOW
Potassium: 4.2
Potassium: 4
Sodium: 149 — ABNORMAL HIGH
Sodium: 150 — ABNORMAL HIGH
Total Bilirubin: 0.8
Total Bilirubin: 0.4
Total Protein: 4.4 — ABNORMAL LOW
Total Protein: 4.8 — ABNORMAL LOW

## 2011-05-20 LAB — DIFFERENTIAL
Basophils Absolute: 0
Basophils Relative: 0
Basophils Relative: 1
Eosinophils Absolute: 0
Eosinophils Relative: 0
Lymphocytes Relative: 15
Monocytes Absolute: 0.6
Monocytes Absolute: 0.7
Monocytes Relative: 3
Neutro Abs: 5.9
Neutrophils Relative %: 88 — ABNORMAL HIGH
Neutrophils Relative %: 73

## 2011-05-20 LAB — BASIC METABOLIC PANEL
BUN: 14
BUN: 22
BUN: 64 — ABNORMAL HIGH
BUN: 86 — ABNORMAL HIGH
BUN: 99 — ABNORMAL HIGH
BUN: 13
CO2: 22
CO2: 23
CO2: 25
Calcium: 7.6 — ABNORMAL LOW
Calcium: 8.6
Calcium: 8.8
Calcium: 9
Chloride: 111
Chloride: 119 — ABNORMAL HIGH
Chloride: 95 — ABNORMAL LOW
Chloride: 113 — ABNORMAL HIGH
Creatinine, Ser: 1.15
Creatinine, Ser: 1.9 — ABNORMAL HIGH
Creatinine, Ser: 2.48 — ABNORMAL HIGH
Creatinine, Ser: 3.1 — ABNORMAL HIGH
Creatinine, Ser: 0.97
GFR calc Af Amer: 24 — ABNORMAL LOW
GFR calc Af Amer: 31 — ABNORMAL LOW
GFR calc Af Amer: 42 — ABNORMAL LOW
GFR calc Af Amer: >60
GFR calc non Af Amer: 25 — ABNORMAL LOW
GFR calc non Af Amer: 40 — ABNORMAL LOW
GFR calc non Af Amer: >60
GFR calc non Af Amer: >60
Glucose, Bld: 119 — ABNORMAL HIGH
Glucose, Bld: 80
Potassium: 3.4 — ABNORMAL LOW
Potassium: 3.6
Potassium: 4.2
Potassium: 3.9
Sodium: 131 — ABNORMAL LOW
Sodium: 135
Sodium: 135
Sodium: 146 — ABNORMAL HIGH

## 2011-05-20 LAB — TYPE AND SCREEN: Antibody Screen: NEGATIVE

## 2011-05-20 LAB — IRON AND TIBC: UIBC: 225

## 2011-05-20 LAB — PREPARE FRESH FROZEN PLASMA

## 2011-05-20 LAB — PREPARE RBC (CROSSMATCH)

## 2011-05-20 LAB — CROSSMATCH

## 2011-05-20 LAB — ABO/RH: ABO/RH(D): A POS

## 2011-05-20 LAB — CULTURE, BLOOD (ROUTINE X 2): Culture: NO GROWTH

## 2011-05-20 LAB — CLOSTRIDIUM DIFFICILE EIA

## 2011-05-20 LAB — HEPATIC FUNCTION PANEL
ALT: 53
AST: 35
Albumin: 3.1 — ABNORMAL LOW
Total Protein: 7

## 2011-05-20 LAB — PROTIME-INR
INR: 2.5 — ABNORMAL HIGH
INR: 2.7 — ABNORMAL HIGH
INR: 2.8 — ABNORMAL HIGH
Prothrombin Time: 29 — ABNORMAL HIGH
Prothrombin Time: 29.1 — ABNORMAL HIGH
Prothrombin Time: 30.5 — ABNORMAL HIGH

## 2011-05-20 LAB — GRAM STAIN

## 2011-05-20 LAB — PREALBUMIN: Prealbumin: 16.3 — ABNORMAL LOW

## 2011-05-20 LAB — MAGNESIUM
Magnesium: 1.8
Magnesium: 2

## 2011-05-20 LAB — AMYLASE: Amylase: 21 — ABNORMAL LOW

## 2011-05-20 LAB — HEMOGLOBIN AND HEMATOCRIT, BLOOD
HCT: 35.7 — ABNORMAL LOW
HCT: 24 — ABNORMAL LOW
Hemoglobin: 12.1 — ABNORMAL LOW
Hemoglobin: 8.8 — ABNORMAL LOW

## 2011-05-20 LAB — PHOSPHORUS
Phosphorus: 2.3
Phosphorus: 2.8
Phosphorus: 2.9

## 2011-05-20 LAB — ANAEROBIC CULTURE

## 2011-05-20 LAB — WOUND CULTURE

## 2011-06-08 LAB — HEPARIN LEVEL (UNFRACTIONATED)
Heparin Unfractionated: 0.18 — ABNORMAL LOW
Heparin Unfractionated: 0.56
Heparin Unfractionated: 0.6
Heparin Unfractionated: 0.62
Heparin Unfractionated: 0.63
Heparin Unfractionated: 0.71 — ABNORMAL HIGH
Heparin Unfractionated: 0.78 — ABNORMAL HIGH

## 2011-06-08 LAB — CARDIAC PANEL(CRET KIN+CKTOT+MB+TROPI): Total CK: 127

## 2011-06-08 LAB — BASIC METABOLIC PANEL
BUN: 13
BUN: 17
CO2: 21
CO2: 26
CO2: 26
Calcium: 9.4
Chloride: 104
Chloride: 104
Chloride: 108
Creatinine, Ser: 1.06
Creatinine, Ser: 1.1
GFR calc Af Amer: >60
GFR calc Af Amer: >60
GFR calc non Af Amer: >60
Glucose, Bld: 84
Potassium: 3.7
Potassium: 4
Potassium: 4.1
Potassium: 4.2
Sodium: 138
Sodium: 138
Sodium: 139

## 2011-06-08 LAB — CBC
HCT: 39.9
HCT: 43.5
Hemoglobin: 13.6
Hemoglobin: 14.8
MCHC: 33.6
MCHC: 33.7
MCHC: 33.8
MCHC: 33.8
MCHC: 34.1
MCV: 89.4
MCV: 89.5
MCV: 90.5
Platelets: 157
Platelets: 158
Platelets: 168
RBC: 4.46
RBC: 4.52
RBC: 4.61
RBC: 4.8
RBC: 4.89
WBC: 6.9
WBC: 7.2
WBC: 7.2
WBC: 7.5

## 2011-06-08 LAB — PROTIME-INR
INR: 2.1 — ABNORMAL HIGH
Prothrombin Time: 14.4
Prothrombin Time: 14.4
Prothrombin Time: 20.6 — ABNORMAL HIGH
Prothrombin Time: 24.3 — ABNORMAL HIGH
Prothrombin Time: 25.3 — ABNORMAL HIGH

## 2011-06-08 LAB — TSH: TSH: 2.613

## 2011-06-08 LAB — B-NATRIURETIC PEPTIDE (CONVERTED LAB)
Pro B Natriuretic peptide (BNP): 142 — ABNORMAL HIGH
Pro B Natriuretic peptide (BNP): 433 — ABNORMAL HIGH

## 2011-06-27 ENCOUNTER — Encounter: Payer: Self-pay | Admitting: Internal Medicine

## 2011-06-27 ENCOUNTER — Ambulatory Visit: Payer: Medicare Other | Admitting: Internal Medicine

## 2011-06-27 ENCOUNTER — Ambulatory Visit (INDEPENDENT_AMBULATORY_CARE_PROVIDER_SITE_OTHER): Payer: Medicare Other | Admitting: Internal Medicine

## 2011-06-27 DIAGNOSIS — Z136 Encounter for screening for cardiovascular disorders: Secondary | ICD-10-CM

## 2011-06-27 DIAGNOSIS — I4891 Unspecified atrial fibrillation: Secondary | ICD-10-CM

## 2011-06-27 DIAGNOSIS — I429 Cardiomyopathy, unspecified: Secondary | ICD-10-CM

## 2011-06-27 DIAGNOSIS — I10 Essential (primary) hypertension: Secondary | ICD-10-CM

## 2011-06-27 LAB — HEPATIC FUNCTION PANEL
Albumin: 3.9 g/dL (ref 3.5–5.2)
Total Protein: 7 g/dL (ref 6.0–8.3)

## 2011-06-27 LAB — BASIC METABOLIC PANEL
BUN: 19 mg/dL (ref 6–23)
Calcium: 9.4 mg/dL (ref 8.4–10.5)
GFR: 50.99 mL/min — ABNORMAL LOW (ref >60.00)
Glucose, Bld: 94 mg/dL (ref 70–99)

## 2011-06-27 LAB — TSH: TSH: 4.08 u[IU]/mL (ref 0.35–5.50)

## 2011-06-27 NOTE — Assessment & Plan Note (Addendum)
Better controlled  at home than in the office we'll continue current medications

## 2011-06-27 NOTE — Patient Instructions (Addendum)
Your physician recommends that you have lab work today: bmp/tsh/liver  Your physician recommends that you continue on your current medications as directed. Please refer to the Current Medication list given to you today.  Your physician wants you to follow-up in: 1 year with Dr. Graciela Husbands. You will receive a reminder letter in the mail two months in advance. If you don't receive a letter, please call our office to schedule the follow-up appointment.

## 2011-06-27 NOTE — Progress Notes (Signed)
  HPI  Antonio [name] is a 77 y.o. male seen in followup for paroxysmal atrial fibrillation associated with a tachycardia-induced cardiomyopathy which has now normalized. He takes amiodarone. He has had no recurrent palpitations of which he is aware. He denies problems with exercise intolerance chest pain shortness of breath or peripheral edema.  Blood pressures have been well controlled at home.    TSH was on the borderline level of high the summer. Liver function tests were not checked  Past Medical History  Diagnosis Date  . Hypertension   . Cardiomyopathy     secondary to Tachycardia. A-Acute on chronic admit for CHF 6/08. B- CHF coupled with atrial fib RVR electrophysiology study 09/27/07.  A- Multiple flutters and Atrial fib- no ablation performed b- ibutilide cardoversion c- amiordarone load- d/c in sinus rhyrthm d- coumadin anticoagulation- stopped at time of GI bleed 11/09  . Mallory - Weiss tear     esophagitis, 6 unit hemorrhage- EPI injections   . Glaucoma   . Atrial fibrillation   . Appendicitis     SBP 7/09 ruputre w/ RLQ abscess 03/16/08  . Hyperlipidemia     Past Surgical History  Procedure Date  . Hemorrhoid surgery   . Tee/dccv 02/07/07  . Ep study 09/27/07    see [facility]h  . Laporotomy     exploratory, ruptured appendix, RLQ abscess  . Small bowel obstrudtion     d/t  appendicitis  . Mallory-weiss tear     epinephrine injection (6 units lost)    Current Outpatient Prescriptions  Medication Sig Dispense Refill  . amiodarone (PACERONE) 200 MG tablet Take 1/2 tab as directed  30 tablet  56  . amLODipine (NORVASC) 5 MG tablet Take 1 tablet (5 mg total) by mouth daily.  30 tablet  3  . aspirin 325 MG tablet Take 325 mg by mouth daily.        . bisoprolol (ZEBETA) 5 MG tablet 1/2 by mouth bid.  30 tablet  4  . dorzolamide (TRUSOPT) 2 % ophthalmic solution Place 1 drop into both eyes 2 (two) times daily.        . Multiple Vitamins-Minerals (CENTRUM) tablet Take 1 tablet by  mouth daily.        . timolol (TIMOPTIC) 0.5 % ophthalmic solution Place 1 drop into both eyes daily.          No Known Allergies  Review of Systems negative except from HPI and PMH  Physical Exam Well developed and well nourished in no acute distress HENT normal E scleral and icterus clear Neck Supple JVP flat; carotids brisk and full Clear to ausculation Regular rate and rhythm, no murmurs gallops or rub Soft with active bowel sounds No clubbing cyanosis and edema Alert and oriented, grossly normal motor and sensory function Skin Warm and Dry  ECG  Sinus rhythm at 66 Intervals 0.18/110/443 Axis of -65  Assessment and  Plan

## 2011-06-27 NOTE — Assessment & Plan Note (Signed)
Improved cardiomyopathy

## 2011-06-27 NOTE — Assessment & Plan Note (Signed)
Currently stable on low-dose amiodarone. We'll check his surveillance laboratories.  We had a lengthy discussion today regarding anticoagulation. He is a CHADS VASC score of 3. I've recommended long-term oral anticoagulation. I have reviewed with him the rather sobering data regarding the lack of utility of aspirin. He-like continue taking aspirin. I've asked you to reduce his dose to 81 mg. I've also told him that hopefully with the release of apixoban we can try that.

## 2011-06-29 ENCOUNTER — Encounter: Payer: Self-pay | Admitting: Internal Medicine

## 2011-06-29 ENCOUNTER — Ambulatory Visit (INDEPENDENT_AMBULATORY_CARE_PROVIDER_SITE_OTHER): Payer: Medicare Other | Admitting: Internal Medicine

## 2011-06-29 DIAGNOSIS — I10 Essential (primary) hypertension: Secondary | ICD-10-CM

## 2011-06-29 DIAGNOSIS — E785 Hyperlipidemia, unspecified: Secondary | ICD-10-CM

## 2011-06-29 DIAGNOSIS — N400 Enlarged prostate without lower urinary tract symptoms: Secondary | ICD-10-CM

## 2011-06-29 DIAGNOSIS — I4891 Unspecified atrial fibrillation: Secondary | ICD-10-CM

## 2011-06-29 DIAGNOSIS — Z0000 Encounter for general adult medical examination without abnormal findings: Secondary | ICD-10-CM

## 2011-06-29 DIAGNOSIS — Z23 Encounter for immunization: Secondary | ICD-10-CM

## 2011-06-29 DIAGNOSIS — E079 Disorder of thyroid, unspecified: Secondary | ICD-10-CM

## 2011-06-29 HISTORY — DX: Benign prostatic hyperplasia without lower urinary tract symptoms: N40.0

## 2011-06-29 LAB — LIPID PANEL
Cholesterol: 202 mg/dL — ABNORMAL HIGH (ref 0–200)
HDL: 43.8 mg/dL (ref >39.00)
Triglycerides: 249 mg/dL — ABNORMAL HIGH (ref 0.0–149.0)
VLDL: 49.8 mg/dL — ABNORMAL HIGH (ref 0.0–40.0)

## 2011-06-29 LAB — CBC WITH DIFFERENTIAL/PLATELET
Basophils Absolute: 0 10*3/uL (ref 0.0–0.1)
Basophils Relative: 0.6 % (ref 0.0–3.0)
Eosinophils Absolute: 0.2 10*3/uL (ref 0.0–0.7)
Lymphocytes Relative: 35 % (ref 12.0–46.0)
MCHC: 34.5 g/dL (ref 30.0–36.0)
MCV: 94.8 fl (ref 78.0–100.0)
Monocytes Absolute: 0.5 10*3/uL (ref 0.1–1.0)
Neutrophils Relative %: 51.5 % (ref 43.0–77.0)
Platelets: 208 10*3/uL (ref 150.0–400.0)
RDW: 13.5 % (ref 11.5–14.6)

## 2011-06-29 MED ORDER — ZOSTER VACCINE LIVE 19400 UNT/0.65ML ~~LOC~~ SOLR: 0.6500 mL | Freq: Once | SUBCUTANEOUS | Status: DC

## 2011-06-29 NOTE — Assessment & Plan Note (Addendum)
DRE showed a slt enlarged gland, he is asx Labs (unable to check a PSA d/t coding restrictions)

## 2011-06-29 NOTE — Assessment & Plan Note (Signed)
Note from cards reviewed, reluctant to take coumadin

## 2011-06-29 NOTE — Assessment & Plan Note (Signed)
Well controlled today, reports normal amb BPs

## 2011-06-29 NOTE — Assessment & Plan Note (Signed)
Remains reluctant to take meds

## 2011-06-29 NOTE — Assessment & Plan Note (Signed)
Last TSH ok few days ago

## 2011-06-29 NOTE — Assessment & Plan Note (Addendum)
Td 2011, got a flu  Shot today, pneumonia shot 2009   shingles immunization-- benefits discussed, Rx provided    pt has never had colonoscopy , discussed issue again: "not interested" iFOB neg 03-2009 and 2011  iFOB provided   Discussed a referral to ENT d/t tinnitus, will call if interested   diet and exercise discussed

## 2011-06-29 NOTE — Progress Notes (Signed)
Subjective:    Patient ID: Antonio [name], male    DOB: Sep 09, 1928, 77 y.o.   MRN: [id number]  HPI Here for Medicare AWV: 1. Risk factors based on Past M, S, F history: reviewed 2. Physical Activities: active, still works PT Radiation protection practitioner ), yard work 3. Depression/mood: denies, no problems noted  4. Hearing: slt  decreased per pt, however  no problems noted , some tinnitus in the L x years,                   used to work in a noisy enviroment 5. ADL's: totally independent , still drives  6. Fall Risk:  no recent falls, counseled   7. Home Safety: does feel safe at home 8. Height, weight, &visual acuity: see VS, vision ok, s/p cataract surgery, has yearly exam soon 9. Counseling: yes , see below  10. Labs ordered based on risk factors: yes 11.           Referral Coordination, if needed  12.           Care Plan, see a/p  13.            Cognitive Assessment , motor skills, cognition and memory seem appropriate  In addition we discussed the following issues Hypertension--good medication compliance, reports normal ambulatory BPs High cholesterol--remains reluctant to take any medications. Glaucoma--good compliance with medicines. Cardiomyopathy, atrial fibrillation---cardiology note reviewed, reluctant to take coumadin.   Past Medical History  Diagnosis Date  . Hypertension   . Cardiomyopathy     secondary to Tachycardia. A-Acute on chronic admit for CHF 6/08. B- CHF coupled with atrial fib RVR electrophysiology study 09/27/07.  A- Multiple flutters and Atrial fib- no ablation performed b- ibutilide cardoversion c- amiordarone load- d/c in sinus rhyrthm d- coumadin anticoagulation- stopped at time of GI bleed 11/09  . Mallory - Weiss tear     esophagitis, 6 unit hemorrhage- EPI injections   . Glaucoma   . Atrial fibrillation   . Appendicitis     SBP 7/09 ruputre w/ RLQ abscess 03/16/08  . Hyperlipidemia    Past Surgical History  Procedure Date  . Hemorrhoid surgery   . Tee/dccv  02/07/07  . Ep study 09/27/07    see pmh  . Laporotomy     exploratory, ruptured appendix, RLQ abscess  . Small bowel obstrudtion     d/t  appendicitis  . Mallory-weiss tear     epinephrine injection (6 units lost)   History   Social History  . Marital Status: Married    Spouse Name: N/A    Number of Children: 3  . Years of Education: N/A   Occupational History  . Cabinetmaker     works part time   Social History Main Topics  . Smoking status: Former Games developer  . Smokeless tobacco: Not on file   Comment: quit tobacco remotely   . Alcohol Use: No  . Drug Use: No  . Sexually Active: Not on file   Other Topics Concern  . Not on file   Social History Narrative   Very active---   Family History  Problem Relation Age of Onset  . Cancer Mother     of male organs, details unknown  . Colon cancer Neg Hx   . Prostate cancer Neg Hx   . Diabetes Neg Hx   . Heart attack      uncle , late onset    Review of Systems No chest pain, shortness or  breath or edema. No nausea, vomiting, diarrhea. No blood in the stools, occasional constipation. Denies any difficulty urinating, hematuria or dysuria.     Objective:   Physical Exam  Constitutional: He is oriented to person, place, and time. He appears well-developed. No distress.  HENT:  Head: Normocephalic and atraumatic.  Neck: No thyromegaly present.       Normal carotid pulses  Cardiovascular: Normal rate and regular rhythm.   No murmur heard. Pulmonary/Chest: Effort normal and breath sounds normal. No respiratory distress. He has no wheezes. He has no rales.  Abdominal: Soft. He exhibits no distension. There is no tenderness. There is no rebound and no guarding.       Post surgical ventral hernia noted, reducible  Genitourinary: Rectum normal.       Prostate is slightly enlarged, nontender or nodular  Musculoskeletal: He exhibits no edema.  Neurological: He is alert and oriented to person, place, and time.  Skin: Skin is  warm and dry. He is not diaphoretic.  Psychiatric: He has a normal mood and affect. His behavior is normal. Judgment and thought content normal.          Assessment & Plan:

## 2011-07-04 ENCOUNTER — Telehealth: Payer: Self-pay | Admitting: Internal Medicine

## 2011-07-04 ENCOUNTER — Encounter: Payer: Self-pay | Admitting: *Deleted

## 2011-07-04 NOTE — Telephone Encounter (Signed)
Left patient a message to call back. According to lab records Dr. Leta Jungling office left a message in Pt's answer service  about lab results and recommendations.

## 2011-07-04 NOTE — Telephone Encounter (Signed)
Pt returning call to [facility]. Pt thinks call might be regarding lab results for pt. Please return call to discuss further.

## 2011-07-05 NOTE — Telephone Encounter (Signed)
Left message results of lab normal

## 2011-07-05 NOTE — Telephone Encounter (Signed)
Follow up from previous call.  Returning call back to nurse 

## 2011-07-05 NOTE — Telephone Encounter (Signed)
LMTC

## 2011-07-12 ENCOUNTER — Other Ambulatory Visit: Payer: Medicare Other

## 2011-07-12 ENCOUNTER — Telehealth: Payer: Self-pay | Admitting: Internal Medicine

## 2011-07-12 ENCOUNTER — Other Ambulatory Visit: Payer: Self-pay | Admitting: Internal Medicine

## 2011-07-12 DIAGNOSIS — R195 Other fecal abnormalities: Secondary | ICD-10-CM

## 2011-07-12 DIAGNOSIS — Z1211 Encounter for screening for malignant neoplasm of colon: Secondary | ICD-10-CM

## 2011-07-12 NOTE — Telephone Encounter (Signed)
Advised patient, IFOB came back positive and the standard of care  is to have a colonoscopy. Please arrange a GI referral for consideration of a colonoscopy. If the patient likes to discuss further, please arrange an appointment

## 2011-07-13 NOTE — Telephone Encounter (Signed)
Left message for pt to call back  °

## 2011-07-15 NOTE — Telephone Encounter (Signed)
Left message on voicemail for patient to return call when available   

## 2011-07-18 NOTE — Telephone Encounter (Signed)
Several attempts to reach pt by phone to no avail. Messages left for pt to call back. Results mailed and GI ref placed

## 2011-07-26 ENCOUNTER — Other Ambulatory Visit: Payer: Self-pay | Admitting: Internal Medicine

## 2011-08-02 ENCOUNTER — Ambulatory Visit (INDEPENDENT_AMBULATORY_CARE_PROVIDER_SITE_OTHER): Payer: Medicare Other | Admitting: Gastroenterology

## 2011-08-02 ENCOUNTER — Encounter: Payer: Self-pay | Admitting: Gastroenterology

## 2011-08-02 VITALS — BP 150/90 | HR 76 | Ht 74.0 in | Wt 256.0 lb

## 2011-08-02 DIAGNOSIS — R195 Other fecal abnormalities: Secondary | ICD-10-CM

## 2011-08-02 MED ORDER — PEG-KCL-NACL-NASULF-NA ASC-C 100 G PO SOLR: 1.0000 | ORAL | Status: DC

## 2011-08-02 NOTE — Progress Notes (Signed)
HPI: This is a   very pleasant 77 year old man whom I am meeting for the first time today.  He underwent upper endoscopy by Dr. Marina Goodell in 2009 when he presented with significant upper GI bleeding.  He had 6 units of blood transfused. He was felt to have a Mallory-Weiss tear which was injected with epinephrine.  He was on coumadin at that time.  Coumadin was not restarted, but was put on ASA instead.    2009 appendectomy.    fobt + stool on recent physical, CBC normal.  He never sees blood in his stool.  He has no signficant constipation or diarrhea.  His weight is down a bit very recently.    No colon cancer in family.  Review of systems: Pertinent positive and negative review of systems were noted in the above HPI section. Complete review of systems was performed and was otherwise normal.    Past Medical History  Diagnosis Date  . Hypertension   . Cardiomyopathy     secondary to Tachycardia. A-Acute on chronic admit for CHF 6/08. B- CHF coupled with atrial fib RVR electrophysiology study 09/27/07.  A- Multiple flutters and Atrial fib- no ablation performed b- ibutilide cardoversion c- amiordarone load- d/c in sinus rhyrthm d- coumadin anticoagulation- stopped at time of GI bleed 11/09  . Mallory - Weiss tear     esophagitis, 6 unit hemorrhage- EPI injections   . Glaucoma   . Atrial fibrillation   . Appendicitis     SBP 7/09 ruputre w/ RLQ abscess 03/16/08  . Hyperlipidemia     Past Surgical History  Procedure Date  . Hemorrhoid surgery   . Tee/dccv 02/07/07  . Ep study 09/27/07    see pmh  . Laparoscopic appendectomy     exploratory, ruptured appendix, RLQ abscess  . Small bowel obstrudtion     d/t  appendicitis  . Mallory-weiss tear     epinephrine injection (6 units lost)    Current Outpatient Prescriptions  Medication Sig Dispense Refill  . amiodarone (PACERONE) 200 MG tablet Take 1/2 tab as directed  30 tablet  56  . amLODipine (NORVASC) 5 MG tablet TAKE 1 TABLET BY MOUTH  ONCE DAILY  30 tablet  3  . aspirin 325 MG tablet Take 325 mg by mouth daily.        . bisoprolol (ZEBETA) 5 MG tablet 1/2 by mouth bid.  30 tablet  4  . dorzolamide (TRUSOPT) 2 % ophthalmic solution Place 1 drop into both eyes 2 (two) times daily.        . Multiple Vitamins-Minerals (CENTRUM) tablet Take 1 tablet by mouth daily.        . timolol (TIMOPTIC) 0.5 % ophthalmic solution Place 1 drop into both eyes daily.          Allergies as of 08/02/2011  . (No Known Allergies)    Family History  Problem Relation Age of Onset  . Cancer Mother     of male organs, details unknown  . Colon cancer Neg Hx   . Prostate cancer Neg Hx   . Diabetes Neg Hx   . Heart attack Maternal Uncle     History   Social History  . Marital Status: Married    Spouse Name: N/A    Number of Children: 3  . Years of Education: N/A   Occupational History  . Cabinetmaker     works part time   Social History Main Topics  . Smoking status: Former Games developer  .  Smokeless tobacco: Never Used   Comment: quit tobacco remotely   . Alcohol Use: No  . Drug Use: No  . Sexually Active: Not on file   Other Topics Concern  . Not on file   Social History Narrative   Very active---       Physical Exam: BP 150/90  Pulse 76  Ht 6\' 2"  (1.88 m)  Wt 256 lb (116.121 kg)  BMI 32.87 kg/m2 Constitutional: generally well-appearing Psychiatric: alert and oriented x3 Eyes: extraocular movements intact Mouth: oral pharynx moist, no lesions Neck: supple no lymphadenopathy Cardiovascular: heart regular rate and rhythm Lungs: clear to auscultation bilaterally Abdomen: soft, nontender, nondistended, no obvious ascites, no peritoneal signs, normal bowel sounds Extremities: no lower extremity edema bilaterally Skin: no lesions on visible extremities    Assessment and plan: 77 y.o. male with  fecal occult-positive stool  We will proceed with colonoscopy at his soonest convenience. I see no reason for any  further blood tests or imaging studies prior to that.

## 2011-08-02 NOTE — Patient Instructions (Signed)
You will be set up for a colonoscopy. A copy of this information will be made available to Dr. Drue Novel.

## 2011-08-26 ENCOUNTER — Other Ambulatory Visit: Payer: Self-pay | Admitting: Internal Medicine

## 2011-08-26 MED ORDER — BISOPROLOL FUMARATE 5 MG PO TABS: ORAL_TABLET | ORAL | Status: DC

## 2011-08-26 NOTE — Telephone Encounter (Signed)
Rx sent 

## 2011-08-31 ENCOUNTER — Encounter: Payer: Self-pay | Admitting: Gastroenterology

## 2011-08-31 ENCOUNTER — Ambulatory Visit (AMBULATORY_SURGERY_CENTER): Payer: Medicare Other | Admitting: Gastroenterology

## 2011-08-31 VITALS — BP 140/89 | HR 67 | Temp 96.8°F | Resp 20 | Ht 74.0 in | Wt 256.0 lb

## 2011-08-31 DIAGNOSIS — D378 Neoplasm of uncertain behavior of other specified digestive organs: Secondary | ICD-10-CM

## 2011-08-31 DIAGNOSIS — R195 Other fecal abnormalities: Secondary | ICD-10-CM | POA: Diagnosis not present

## 2011-08-31 DIAGNOSIS — D126 Benign neoplasm of colon, unspecified: Secondary | ICD-10-CM

## 2011-08-31 DIAGNOSIS — D371 Neoplasm of uncertain behavior of stomach: Secondary | ICD-10-CM

## 2011-08-31 DIAGNOSIS — K5730 Diverticulosis of large intestine without perforation or abscess without bleeding: Secondary | ICD-10-CM

## 2011-08-31 DIAGNOSIS — D375 Neoplasm of uncertain behavior of rectum: Secondary | ICD-10-CM

## 2011-08-31 MED ORDER — SODIUM CHLORIDE 0.9 % IV SOLN: 500.0000 mL | INTRAVENOUS | Status: DC

## 2011-08-31 NOTE — Patient Instructions (Signed)
Resume all medications. D/C information completed with family.Information given on polyps and diverticulosis and high fiber diet.

## 2011-08-31 NOTE — Op Note (Signed)
[facility] [address] [place], [place]  [address]  COLONOSCOPY PROCEDURE REPORT  PATIENT:  Antonio [name], Antonio [name]  MR#:  [id number] BIRTHDATE:  10/27/1928, 82 yrs. old  GENDER:  male ENDOSCOPIST:  Rachael Fee, MD REF. BY:  Willow Ora, M.D. PROCEDURE DATE:  08/31/2011 PROCEDURE:  Colonoscopy with snare polypectomy ASA CLASS:  Class II INDICATIONS:  FOBT positive stool MEDICATIONS:   Fentanyl 50 mcg IV, These medications were titrated to patient response per physician's verbal order, Versed 5 mg IV  DESCRIPTION OF PROCEDURE:   After the risks benefits and alternatives of the procedure were thoroughly explained, informed consent was obtained.  Digital rectal exam was performed and revealed no rectal masses.   The LB 180AL [id number] endoscope was introduced through the anus and advanced to the cecum, which was identified by both the appendix and ileocecal valve, without limitations.  The quality of the prep was good..  The instrument was then slowly withdrawn as the colon was fully examined. <<PROCEDUREIMAGES>>  FINDINGS:  Three polyps were found, removed and sent to pathology. One was close to IC valve, sessile, 1.3cm across, removed with piecemeal snare/cautery (path 1). One was at hepatic flexure, 5mm across, sessile, removed with cold snare (path 2). The last was pedunculated, 10mm, located in sigmoid, removed with snare/cautery (path 2) (see image5, image8, and image9).  Mild diverticulosis was found in the sigmoid to descending colon segments (see image1).  This was otherwise a normal examination of the colon (see image2, image3, and image10).   Retroflexed views in the rectum revealed no abnormalities. COMPLICATIONS:  None ENDOSCOPIC IMPRESSION: 1) Three polyps, all were removed and sent to pathology.   One was removed in piecmeal fashion. 2) Mild diverticulosis in the sigmoid to descending colon segments 3) Otherwise normal examination  RECOMMENDATIONS: 1) You  will receive a letter within 1-2 weeks with the results of your biopsy as well as final recommendations. Please call my office if you have not received a letter after 3 weeks.  ______________________________ Rachael Fee, MD  n. eSIGNED:   Rachael Fee at 08/31/2011 11:40 AM  Mardi Main[place], [phone]

## 2011-08-31 NOTE — Progress Notes (Signed)
Patient did not experience any of the following events: a burn prior to discharge; a fall within the facility; wrong site/side/patient/procedure/implant event; or a hospital transfer or hospital admission upon discharge from the facility. (G8907) Patient did not have preoperative order for IV antibiotic SSI prophylaxis. (G8918)  

## 2011-09-01 ENCOUNTER — Telehealth: Payer: Self-pay | Admitting: *Deleted

## 2011-09-01 NOTE — Telephone Encounter (Signed)
Left message on number given in admitting as directed per pt. ewm

## 2011-09-07 ENCOUNTER — Telehealth: Payer: Self-pay

## 2011-09-07 DIAGNOSIS — K635 Polyp of colon: Secondary | ICD-10-CM

## 2011-09-07 MED ORDER — PEG-KCL-NACL-NASULF-NA ASC-C 100 G PO SOLR: 1.0000 | ORAL | Status: DC

## 2011-09-07 NOTE — Telephone Encounter (Signed)
Pt has been scheduled for another colon on 09/30/11 he has been instructed and meds reviewed.  Free movi prep coupon also mailed to the pt along with instructions

## 2011-09-07 NOTE — Telephone Encounter (Signed)
Message copied by Donata Duff on Wed Sep 07, 2011  1:17 PM ------      Message from: [name] P      Created: Wed Sep 07, 2011 12:33 PM                   i spoke with him about bx results.  Polyp with HGD (was piecemeal resected).  I recommended repeat colonoscopy in next few weeks to recheck the site, biopsy it.  He agreed            Antonio [name],       Can you schedule this at [facility], no need for results letter

## 2011-09-30 ENCOUNTER — Other Ambulatory Visit: Payer: Medicare Other | Admitting: Gastroenterology

## 2011-10-10 ENCOUNTER — Ambulatory Visit (AMBULATORY_SURGERY_CENTER): Payer: Medicare Other | Admitting: Gastroenterology

## 2011-10-10 ENCOUNTER — Encounter: Payer: Self-pay | Admitting: Gastroenterology

## 2011-10-10 DIAGNOSIS — D126 Benign neoplasm of colon, unspecified: Secondary | ICD-10-CM

## 2011-10-10 DIAGNOSIS — Z86010 Personal history of colonic polyps: Secondary | ICD-10-CM

## 2011-10-10 DIAGNOSIS — K5730 Diverticulosis of large intestine without perforation or abscess without bleeding: Secondary | ICD-10-CM

## 2011-10-10 DIAGNOSIS — R195 Other fecal abnormalities: Secondary | ICD-10-CM

## 2011-10-10 DIAGNOSIS — R933 Abnormal findings on diagnostic imaging of other parts of digestive tract: Secondary | ICD-10-CM

## 2011-10-10 MED ORDER — SODIUM CHLORIDE 0.9 % IV SOLN: 500.0000 mL | INTRAVENOUS | Status: DC

## 2011-10-10 NOTE — Progress Notes (Signed)
The pt tolerated the colonoscopy very well. Maw  Blood prressure dropped to 51/22 retook b/p 67/43.  Pt W,D,P and R. IV wide open Drip. Maw  Heart rate dropped as low as 43 and gradulally increased. Maw   Retook b/p 98/73 and 106/70. Maw

## 2011-10-10 NOTE — Op Note (Signed)
 [facility] [address] [place], [place]  [address]  COLONOSCOPY PROCEDURE REPORT  PATIENT:  Antonio, [name]  MR#:  [id number] BIRTHDATE:  16-May-1929, 82 yrs. old  GENDER:  male ENDOSCOPIST:  Rachael Fee, MD PROCEDURE DATE:  10/10/2011 PROCEDURE:  Colonoscopy with hot biopsy, Colonoscopy with snare polypectomy ASA CLASS:  Class II INDICATIONS:  colonoscopy 2 months ago found 3 polyps; one was 1-2cm, sessile, located near IC valve, removed piecemeal and path showed foci of HGD MEDICATIONS:   Fentanyl 50 mcg IV, These medications were titrated to patient response per physician's verbal order, Versed 6 mg IV  DESCRIPTION OF PROCEDURE:   After the risks benefits and alternatives of the procedure were thoroughly explained, informed consent was obtained.  Digital rectal exam was performed and revealed no rectal masses.   The LB 180AL [id number] endoscope was introduced through the anus and advanced to the cecum, which was identified by both the appendix and ileocecal valve, without limitations.  The quality of the prep was good..  The instrument was then slowly withdrawn as the colon was fully examined. <<PROCEDUREIMAGES>> FINDINGS:  The site of recent polypectomy (just distal to the IC valve) was clearly visible. There was 3-83mm of residual adenoma present. This was remove with cold snareand then hot biopsy forceps was used to detroy  any microscopic adenoma at the site (path jar 1) (see image3, image5, image8, and image9).  Mild diverticulosis was found in the sigmoid to descending colon segments (see image1).  This was otherwise a normal examination of the colon (see image11).   Retroflexed views in the rectum revealed no abnormalities. COMPLICATIONS:  None  ENDOSCOPIC IMPRESSION: 1) Site of previous polypectomy located, there was a small amount of residual polyp which was removed with snare. The site was then cauterized with tip of biopsy forceps. 2) Mild  diverticulosis in the sigmoid to descending colon segments 3) Otherwise normal examination  RECOMMENDATIONS: Await final pathology. Likely will need repeat examination in 6-12 months.  ______________________________ Rachael Fee, MD  CC: Willow Ora, MD  n. eSIGNED:   Rachael Fee at 10/10/2011 01:56 PM  [place], [phone]

## 2011-10-10 NOTE — Progress Notes (Signed)
Patient did not experience any of the following events: a burn prior to discharge; a fall within the facility; wrong site/side/patient/procedure/implant event; or a hospital transfer or hospital admission upon discharge from the facility. (G8907) Patient did not have preoperative order for IV antibiotic SSI prophylaxis. (G8918)  

## 2011-10-10 NOTE — Patient Instructions (Signed)
YOU HAD AN ENDOSCOPIC PROCEDURE TODAY AT THE [facility]: Refer to the procedure report that was given to you for any specific questions about what was found during the examination.  If the procedure report does not answer your questions, please call your gastroenterologist to clarify.  If you requested that your care partner not be given the details of your procedure findings, then the procedure report has been included in a sealed envelope for you to review at your convenience later.  YOU SHOULD EXPECT: Some feelings of bloating in the abdomen. Passage of more gas than usual.  Walking can help get rid of the air that was put into your GI tract during the procedure and reduce the bloating. If you had a lower endoscopy (such as a colonoscopy or flexible sigmoidoscopy) you may notice spotting of blood in your stool or on the toilet paper. If you underwent a bowel prep for your procedure, then you may not have a normal bowel movement for a few days.  DIET: Your first meal following the procedure should be a light meal and then it is ok to progress to your normal diet.  A half-sandwich or bowl of soup is an example of a good first meal.  Heavy or fried foods are harder to digest and may make you feel nauseous or bloated.  Likewise meals heavy in dairy and vegetables can cause extra gas to form and this can also increase the bloating.  Drink plenty of fluids but you should avoid alcoholic beverages for 24 hours.  ACTIVITY: Your care partner should take you home directly after the procedure.  You should plan to take it easy, moving slowly for the rest of the day.  You can resume normal activity the day after the procedure however you should NOT DRIVE or use heavy machinery for 24 hours (because of the sedation medicines used during the test).    SYMPTOMS TO REPORT IMMEDIATELY: A gastroenterologist can be reached at any hour.  During normal business hours, 8:30 AM to 5:00 PM Monday through Friday,  call [phone].  After hours and on weekends, please call the GI answering service at [phone] who will take a message and have the physician on call contact you.   Following lower endoscopy (colonoscopy or flexible sigmoidoscopy):  Excessive amounts of blood in the stool  Significant tenderness or worsening of abdominal pains  Swelling of the abdomen that is new, acute  Fever of 100F or higher    FOLLOW UP: If any biopsies were taken you will be contacted by phone or by letter within the next 1-3 weeks.  Call your gastroenterologist if you have not heard about the biopsies in 3 weeks.  Our staff will call the home number listed on your records the next business day following your procedure to check on you and address any questions or concerns that you may have at that time regarding the information given to you following your procedure. This is a courtesy call and so if there is no answer at the home number and we have not heard from you through the emergency physician on call, we will assume that you have returned to your regular daily activities without incident.  SIGNATURES/CONFIDENTIALITY: You and/or your care partner have signed paperwork which will be entered into your electronic medical record.  These signatures attest to the fact that that the information above on your After Visit Summary has been reviewed and is understood.  Full responsibility of the confidentiality   of this discharge information lies with you and/or your care-partner.     

## 2011-10-11 ENCOUNTER — Telehealth: Payer: Self-pay | Admitting: *Deleted

## 2011-10-11 NOTE — Telephone Encounter (Signed)
No answer, message left

## 2011-10-18 ENCOUNTER — Encounter: Payer: Self-pay | Admitting: Gastroenterology

## 2011-10-18 DIAGNOSIS — H4011X0 Primary open-angle glaucoma, stage unspecified: Secondary | ICD-10-CM | POA: Diagnosis not present

## 2011-11-14 ENCOUNTER — Telehealth: Payer: Self-pay | Admitting: Internal Medicine

## 2011-11-14 NOTE — Telephone Encounter (Signed)
Refill: Amlodipine Besylate 5mg  tab. Take 1 tablet by mouth once daily. Qty 30. Last fill 10-24-11  Bisoprolol fumarate 5mg  tab. Take 1/2 tablet by mouth twice daily. Qty 30. Last fill 08-27-11

## 2011-11-15 MED ORDER — AMLODIPINE BESYLATE 5 MG PO TABS: 5.0000 mg | ORAL_TABLET | Freq: Every day | ORAL | Status: DC

## 2011-11-15 MED ORDER — BISOPROLOL FUMARATE 5 MG PO TABS: ORAL_TABLET | ORAL | Status: DC

## 2011-11-15 NOTE — Telephone Encounter (Signed)
Refill done.  

## 2011-12-27 ENCOUNTER — Encounter: Payer: Self-pay | Admitting: Internal Medicine

## 2011-12-27 ENCOUNTER — Ambulatory Visit (INDEPENDENT_AMBULATORY_CARE_PROVIDER_SITE_OTHER): Payer: Medicare Other | Admitting: Internal Medicine

## 2011-12-27 VITALS — BP 144/88 | HR 64 | Temp 97.8°F | Wt 259.0 lb

## 2011-12-27 DIAGNOSIS — I10 Essential (primary) hypertension: Secondary | ICD-10-CM

## 2011-12-27 DIAGNOSIS — L989 Disorder of the skin and subcutaneous tissue, unspecified: Secondary | ICD-10-CM | POA: Diagnosis not present

## 2011-12-27 DIAGNOSIS — E079 Disorder of thyroid, unspecified: Secondary | ICD-10-CM | POA: Diagnosis not present

## 2011-12-27 DIAGNOSIS — I4891 Unspecified atrial fibrillation: Secondary | ICD-10-CM | POA: Diagnosis not present

## 2011-12-27 MED ORDER — AMLODIPINE BESYLATE 10 MG PO TABS: 10.0000 mg | ORAL_TABLET | Freq: Every day | ORAL | Status: DC

## 2011-12-27 NOTE — Assessment & Plan Note (Addendum)
Again patient concerned about use of a high dose of aspirin. Recommend to discuss with cardiology, he has an appointment pending.  He remains reluctant to take Coumadin. We had a brief conversation about newer anticoagulants however the patient is also skeptic about them.

## 2011-12-27 NOTE — Progress Notes (Signed)
  Subjective:    Patient ID: Antonio [name], male    DOB: August 25, 1928, 78 y.o.   MRN: [id number]  HPI Routine office visit History of atrial fibrillation, on aspirin, states that with aspirin 325 mg he develops skin spots (superficial ecchymosis) wonder if he would be okay to drop dose to 81 mg. He is also concerned about skin lesions in his back specifically a cyst. Also concerned about the ventral hernia although he reports you already talk to a surgeon about it and the only way to fix it is surgery and states he won't do that.  Past Medical History  Diagnosis Date  . Hypertension   . Cardiomyopathy     secondary to Tachycardia. A-Acute on chronic admit for CHF 6/08. B- CHF coupled with atrial fib RVR electrophysiology study 09/27/07.  A- Multiple flutters and Atrial fib- no ablation performed b- ibutilide cardoversion c- amiordarone load- d/c in sinus rhyrthm d- coumadin anticoagulation- stopped at time of GI bleed 11/09  . Mallory - Weiss tear     esophagitis, 6 unit hemorrhage- EPI injections   . Glaucoma   . Atrial fibrillation   . Appendicitis     SBP 7/09 ruputre w/ RLQ abscess 03/16/08  . Hyperlipidemia   . Blood transfusion     X6 WITH APPENDECTOMY     Review of Systems His wife has not mentioned specifically any change in the color of the skin  lesions. No nausea, vomiting, diarrhea or abdominal pain. Ambulatory blood pressure check yesterday was 120/78    Objective:   Physical Exam  General -- alert, well-developed.No apparent distress.  Lungs -- normal respiratory effort, no intercostal retractions, no accessory muscle use, and normal breath sounds.   Heart-- seems regular today, no murmur, and no gallop.   Abdomen--soft, non-tender, no distention, large reducible ventral hernia. No mass.   Skin-- multiple skin lesions in the back, there is one that is very dark but looks like  S. K..  Also has a sebaceous cyst. Neurologic-- alert & oriented X3 and strength normal in  all extremities. Psych-- Cognition and judgment appear intact. Alert and cooperative with normal attention span and concentration.  not anxious appearing and not depressed appearing.      Assessment & Plan:

## 2011-12-27 NOTE — Assessment & Plan Note (Signed)
Re do labs  

## 2011-12-27 NOTE — Assessment & Plan Note (Signed)
Likely benign lesion, he does have very dark lesion in the back. Refer to Dr. Terri Piedra

## 2011-12-27 NOTE — Assessment & Plan Note (Addendum)
Last creatinine slightly elevated. Recheck a BMP. Ambulatory BPs apparently within range however DBPs at the office has been consistently over 85 Increase amlodipine

## 2011-12-27 NOTE — Patient Instructions (Signed)
Your BP has been elevated in the office, we are increasing your amlodipine from 5 mg to 10 mg daily. Watch for side effects such as swelling. Check the  blood pressure 2 or 3 times a week, be sure it is between 110/60 and 140/85. If it is consistently higher or lower, let me know

## 2012-01-03 ENCOUNTER — Encounter: Payer: Self-pay | Admitting: Internal Medicine

## 2012-01-03 ENCOUNTER — Ambulatory Visit (INDEPENDENT_AMBULATORY_CARE_PROVIDER_SITE_OTHER): Payer: Medicare Other | Admitting: Internal Medicine

## 2012-01-03 VITALS — BP 158/94 | HR 67 | Ht 74.5 in | Wt 260.8 lb

## 2012-01-03 DIAGNOSIS — I4891 Unspecified atrial fibrillation: Secondary | ICD-10-CM | POA: Diagnosis not present

## 2012-01-03 DIAGNOSIS — I10 Essential (primary) hypertension: Secondary | ICD-10-CM

## 2012-01-03 LAB — HEPATIC FUNCTION PANEL
AST: 27 U/L (ref 0–37)
Alkaline Phosphatase: 86 U/L (ref 39–117)
Bilirubin, Direct: 0 mg/dL (ref 0.0–0.3)
Total Bilirubin: 0.5 mg/dL (ref 0.3–1.2)

## 2012-01-03 LAB — TSH: TSH: 1.38 u[IU]/mL (ref 0.35–5.50)

## 2012-01-03 NOTE — Assessment & Plan Note (Addendum)
Holding sinus on amio  Will check surveillance labs  Discussed anticoag   He has decreased asa 325>>81   Has noted less bleeding  We again discussed apixoban But he remaiins reluctant notwithstanding his CADSVASC 3+

## 2012-01-03 NOTE — Assessment & Plan Note (Signed)
Elevated here but normal at home

## 2012-01-03 NOTE — Progress Notes (Signed)
  HPI  Antonio [name] is a 78 y.o. male  seen in followup for paroxysmal atrial fibrillation associated with a tachycardia-induced cardiomyopathy which has now normalized. He takes amiodarone.  Blood pressures have been well controlled at home although high here  The patient denies chest pain, shortness of breath, nocturnal dyspnea, orthopnea or peripheral edema.  There have been no palpitations, lightheadedness or syncope.   TSH was on the borderline level . Liver function tests were normal 11/12 Past Medical History  Diagnosis Date  . Hypertension   . Cardiomyopathy     secondary to Tachycardia. A-Acute on chronic admit for CHF 6/08. B- CHF coupled with atrial fib RVR electrophysiology study 09/27/07.  A- Multiple flutters and Atrial fib- no ablation performed b- ibutilide cardoversion c- amiordarone load- d/c in sinus rhyrthm d- coumadin anticoagulation- stopped at time of GI bleed 11/09  . Mallory - Weiss tear     esophagitis, 6 unit hemorrhage- EPI injections   . Glaucoma   . Atrial fibrillation   . Appendicitis     SBP 7/09 ruputre w/ RLQ abscess 03/16/08  . Hyperlipidemia   . Blood transfusion     X6 WITH APPENDECTOMY    Past Surgical History  Procedure Date  . Hemorrhoid surgery   . Tee/dccv 02/07/07  . Ep study 09/27/07    see pmh  . Laparoscopic appendectomy     exploratory, ruptured appendix, RLQ abscess  . Small bowel obstrudtion     d/t  appendicitis  . Mallory-weiss tear     epinephrine injection (6 units lost)    Current Outpatient Prescriptions  Medication Sig Dispense Refill  . amiodarone (PACERONE) 200 MG tablet Take 1/2 tab as directed  30 tablet  56  . amLODipine (NORVASC) 10 MG tablet Take 1 tablet (10 mg total) by mouth daily.  90 tablet  1  . aspirin 81 MG tablet Take 81 mg by mouth daily.      . bisoprolol (ZEBETA) 5 MG tablet 1/2 by mouth bid.  30 tablet  3  . dorzolamide (TRUSOPT) 2 % ophthalmic solution Place 1 drop into both eyes 2 (two) times daily.         . Multiple Vitamins-Minerals (CENTRUM) tablet Take 1 tablet by mouth daily.        . timolol (TIMOPTIC) 0.5 % ophthalmic solution Place 1 drop into both eyes daily.          No Known Allergies  Review of Systems negative except from HPI and PMH  Physical Exam BP 158/94  Pulse 67  Ht 6' 2.5" (1.892 m)  Wt 260 lb 12.8 oz (118.298 kg)  BMI 33.04 kg/m2 Well developed and well nourished in no acute distress HENT normal E scleral and icterus clear Neck Supple JVP flat; carotids brisk and full Clear to ausculation Regular rate and rhythm, no murmurs gallops or rub Soft with active bowel sounds No clubbing cyanosis none Edema Alert and oriented, grossly normal motor and sensory function Skin Warm and Dry  ECG  SR 67 22/10/43 Lad  Assessment and  Plan

## 2012-01-03 NOTE — Patient Instructions (Signed)
Your physician recommends that you have lab work today: tsh/liver  Your physician recommends that you continue on your current medications as directed. Please refer to the Current Medication list given to you today.  Your physician wants you to follow-up in: 6 months with Dr. Graciela Husbands. You will receive a reminder letter in the mail two months in advance. If you don't receive a letter, please call our office to schedule the follow-up appointment.

## 2012-01-18 DIAGNOSIS — H409 Unspecified glaucoma: Secondary | ICD-10-CM | POA: Diagnosis not present

## 2012-01-18 DIAGNOSIS — H4011X0 Primary open-angle glaucoma, stage unspecified: Secondary | ICD-10-CM | POA: Diagnosis not present

## 2012-01-24 DIAGNOSIS — D485 Neoplasm of uncertain behavior of skin: Secondary | ICD-10-CM | POA: Diagnosis not present

## 2012-01-24 DIAGNOSIS — D1739 Benign lipomatous neoplasm of skin and subcutaneous tissue of other sites: Secondary | ICD-10-CM | POA: Diagnosis not present

## 2012-01-26 ENCOUNTER — Other Ambulatory Visit (INDEPENDENT_AMBULATORY_CARE_PROVIDER_SITE_OTHER): Payer: Medicare Other

## 2012-01-26 DIAGNOSIS — E079 Disorder of thyroid, unspecified: Secondary | ICD-10-CM | POA: Diagnosis not present

## 2012-01-26 DIAGNOSIS — I10 Essential (primary) hypertension: Secondary | ICD-10-CM | POA: Diagnosis not present

## 2012-01-26 LAB — TSH: TSH: 2.56 u[IU]/mL (ref 0.35–5.50)

## 2012-01-26 LAB — HEPATIC FUNCTION PANEL
ALT: 33 U/L (ref 0–53)
AST: 25 U/L (ref 0–37)
Alkaline Phosphatase: 85 U/L (ref 39–117)
Total Bilirubin: 0.5 mg/dL (ref 0.3–1.2)

## 2012-01-26 NOTE — Progress Notes (Signed)
LABS ONLY  

## 2012-01-27 ENCOUNTER — Telehealth: Payer: Self-pay | Admitting: Internal Medicine

## 2012-01-27 NOTE — Telephone Encounter (Signed)
Lmovm for pt to call office. °

## 2012-01-27 NOTE — Telephone Encounter (Signed)
I ordered a BMP at the last office visit, not done. Needs a BMP, DX hypertension. Please schedule

## 2012-01-31 ENCOUNTER — Other Ambulatory Visit (INDEPENDENT_AMBULATORY_CARE_PROVIDER_SITE_OTHER): Payer: Medicare Other

## 2012-01-31 DIAGNOSIS — I10 Essential (primary) hypertension: Secondary | ICD-10-CM | POA: Diagnosis not present

## 2012-01-31 LAB — BASIC METABOLIC PANEL
BUN: 18 mg/dL (ref 6–23)
CO2: 25 mEq/L (ref 19–32)
Calcium: 9.3 mg/dL (ref 8.4–10.5)
Creatinine, Ser: 0.9 mg/dL (ref 0.4–1.5)
GFR: 85.09 mL/min (ref >60.00)
Glucose, Bld: 87 mg/dL (ref 70–99)
Sodium: 143 mEq/L (ref 135–145)

## 2012-01-31 NOTE — Progress Notes (Signed)
Labs only

## 2012-02-02 ENCOUNTER — Encounter: Payer: Self-pay | Admitting: *Deleted

## 2012-02-08 ENCOUNTER — Other Ambulatory Visit: Payer: Self-pay | Admitting: Internal Medicine

## 2012-02-08 MED ORDER — AMIODARONE HCL 200 MG PO TABS: ORAL_TABLET | ORAL | Status: DC

## 2012-06-14 DIAGNOSIS — H409 Unspecified glaucoma: Secondary | ICD-10-CM | POA: Diagnosis not present

## 2012-06-14 DIAGNOSIS — H04129 Dry eye syndrome of unspecified lacrimal gland: Secondary | ICD-10-CM | POA: Diagnosis not present

## 2012-06-14 DIAGNOSIS — H4011X0 Primary open-angle glaucoma, stage unspecified: Secondary | ICD-10-CM | POA: Diagnosis not present

## 2012-06-14 DIAGNOSIS — Z961 Presence of intraocular lens: Secondary | ICD-10-CM | POA: Diagnosis not present

## 2012-06-21 ENCOUNTER — Other Ambulatory Visit: Payer: Self-pay | Admitting: Internal Medicine

## 2012-06-21 MED ORDER — BISOPROLOL FUMARATE 5 MG PO TABS: ORAL_TABLET | ORAL | Status: DC

## 2012-06-21 MED ORDER — AMLODIPINE BESYLATE 10 MG PO TABS: 10.0000 mg | ORAL_TABLET | Freq: Every day | ORAL | Status: DC

## 2012-06-21 NOTE — Telephone Encounter (Signed)
Refill done.  

## 2012-06-21 NOTE — Telephone Encounter (Signed)
refill x 2-- last ov 5.7.13 76-month follow up   1-AmLODIPine Besylate (Tab) 10 MG Take 1 tablet (10 mg total) by mouth daily # 90 last fill 8.1.13  2-Bisoprolol Fumarate (Tab) 5 MG Take 1/2 tablet by mouth twice daily #30 last fill 9.3.13

## 2012-06-29 ENCOUNTER — Ambulatory Visit (INDEPENDENT_AMBULATORY_CARE_PROVIDER_SITE_OTHER): Payer: Medicare Other | Admitting: Internal Medicine

## 2012-06-29 ENCOUNTER — Encounter: Payer: Self-pay | Admitting: Internal Medicine

## 2012-06-29 VITALS — BP 152/84 | HR 60 | Temp 98.0°F | Ht 74.75 in | Wt 264.0 lb

## 2012-06-29 DIAGNOSIS — E785 Hyperlipidemia, unspecified: Secondary | ICD-10-CM | POA: Diagnosis not present

## 2012-06-29 DIAGNOSIS — I429 Cardiomyopathy, unspecified: Secondary | ICD-10-CM | POA: Diagnosis not present

## 2012-06-29 DIAGNOSIS — E079 Disorder of thyroid, unspecified: Secondary | ICD-10-CM | POA: Diagnosis not present

## 2012-06-29 DIAGNOSIS — Z0000 Encounter for general adult medical examination without abnormal findings: Secondary | ICD-10-CM | POA: Diagnosis not present

## 2012-06-29 DIAGNOSIS — R05 Cough: Secondary | ICD-10-CM | POA: Insufficient documentation

## 2012-06-29 DIAGNOSIS — N400 Enlarged prostate without lower urinary tract symptoms: Secondary | ICD-10-CM

## 2012-06-29 DIAGNOSIS — I10 Essential (primary) hypertension: Secondary | ICD-10-CM | POA: Diagnosis not present

## 2012-06-29 DIAGNOSIS — R319 Hematuria, unspecified: Secondary | ICD-10-CM | POA: Diagnosis not present

## 2012-06-29 DIAGNOSIS — L989 Disorder of the skin and subcutaneous tissue, unspecified: Secondary | ICD-10-CM

## 2012-06-29 LAB — CBC WITH DIFFERENTIAL/PLATELET
Basophils Absolute: 0 10*3/uL (ref 0.0–0.1)
Eosinophils Absolute: 0.2 10*3/uL (ref 0.0–0.7)
Lymphocytes Relative: 31.9 % (ref 12.0–46.0)
MCHC: 33.4 g/dL (ref 30.0–36.0)
Monocytes Relative: 9.8 % (ref 3.0–12.0)
Neutro Abs: 3.1 10*3/uL (ref 1.4–7.7)
Neutrophils Relative %: 54.3 % (ref 43.0–77.0)
Platelets: 211 10*3/uL (ref 150.0–400.0)
RDW: 12.9 % (ref 11.5–14.6)

## 2012-06-29 LAB — BASIC METABOLIC PANEL
CO2: 28 mEq/L (ref 19–32)
Chloride: 108 mEq/L (ref 96–112)
Glucose, Bld: 97 mg/dL (ref 70–99)
Potassium: 3.9 mEq/L (ref 3.5–5.1)
Sodium: 141 mEq/L (ref 135–145)

## 2012-06-29 LAB — LIPID PANEL
Cholesterol: 225 mg/dL — ABNORMAL HIGH (ref 0–200)
HDL: 38.9 mg/dL — ABNORMAL LOW (ref >39.00)
Triglycerides: 183 mg/dL — ABNORMAL HIGH (ref 0.0–149.0)
VLDL: 36.6 mg/dL (ref 0.0–40.0)

## 2012-06-29 LAB — PSA: PSA: 1.41 ng/mL (ref 0.10–4.00)

## 2012-06-29 LAB — POCT URINALYSIS DIPSTICK
Bilirubin, UA: NEGATIVE
Ketones, UA: NEGATIVE
Nitrite, UA: NEGATIVE
Spec Grav, UA: 1.025
pH, UA: 6

## 2012-06-29 MED ORDER — FLUTICASONE PROPIONATE 50 MCG/ACT NA SUSP: 2.0000 | Freq: Every day | NASAL | Status: DC

## 2012-06-29 MED ORDER — ZOSTER VACCINE LIVE 19400 UNT/0.65ML ~~LOC~~ SOLR: 0.6500 mL | Freq: Once | SUBCUTANEOUS | Status: AC

## 2012-06-29 NOTE — Assessment & Plan Note (Addendum)
Prostate remains enlarged, he is asymptomatic.  Urine dip showed microscopic hematuria. Plan: PSA, urine culture

## 2012-06-29 NOTE — Assessment & Plan Note (Signed)
Saw dermatology 01/2012

## 2012-06-29 NOTE — Assessment & Plan Note (Signed)
Seems stable, plans to see cardiology soon

## 2012-06-29 NOTE — Assessment & Plan Note (Addendum)
BP today slightly elevated but normal ambulatory BPs, no change, check labs.

## 2012-06-29 NOTE — Assessment & Plan Note (Addendum)
New problem. See history of present illness, cough months, he has some postnasal drip. Trial with Flonase, see instructions

## 2012-06-29 NOTE — Assessment & Plan Note (Signed)
Td 2011 Flu shot -- will get at the pharmacy  pneumonia shot 2009   shingles immunization--   Rx was provided , never got it, encouraged to think about   09-2011 colonoscopy , repeta in 1 year, Dr Christella Hartigan diet and exercise discussed

## 2012-06-29 NOTE — Progress Notes (Signed)
Subjective:    Patient ID: Antonio [name], male    DOB: 02-Nov-1928, 78 y.o.   MRN: [id number]  HPI  Here for Medicare AWV: 1.         Risk factors based on Past M, S, F history: reviewed 2.         Physical Activities: active, still works PT Radiation protection practitioner ), yard work 3.         Depression/mood: denies, no problems noted   4.         Hearing: slt  decreased per pt, however  no problems noted , some tinnitus in the L x years,                used to work in a noisy environment, declined ENT or audiologist referral  5.         ADL's: totally independent , still drives   6.         Fall Risk:  no recent falls, counseled,   see instructions 7.         Home Safety: does feel safe at home 8.         Height, weight, &visual acuity: see VS, vision ok, s/p cataract surgery, just saw the eye doctor 9.         Counseling: yes , see below   10.       Labs ordered based on risk factors: yes 11.           Referral Coordination, if needed   12.           Care Plan, see a/p   13.            Cognitive Assessment , motor skills, cognition and memory seem appropriate  In addition we discussed the following issues Cough, reports a history of cough for a few months, mostly at night, he feels like of mucus in his throat. Admits to postnasal dripping. Denies any wheezing or GERD. Hypertension, good medication compliance, ambulatory BPs normal. BP today slightly elevated Cardiomyopathy, asymptomatic, good medication compliance.  Past Medical History  Diagnosis Date  . Hypertension   . Cardiomyopathy     secondary to Tachycardia. A-Acute on chronic admit for CHF 6/08. B- CHF coupled with atrial fib RVR electrophysiology study 09/27/07.  A- Multiple flutters and Atrial fib- no ablation performed b- ibutilide cardoversion c- amiordarone load- d/c in sinus rhyrthm d- coumadin anticoagulation- stopped at time of GI bleed 11/09  . Mallory - Weiss tear     esophagitis, 6 unit hemorrhage- EPI injections   .  Glaucoma(365)   . Atrial fibrillation   . Appendicitis     SBP 7/09 ruputre w/ RLQ abscess 03/16/08  . Hyperlipidemia   . Blood transfusion     X6 WITH APPENDECTOMY  . BPH (benign prostatic hyperplasia) 06/29/2011   Past Surgical History  Procedure Date  . Hemorrhoid surgery   . Tee/dccv 02/07/07  . Ep study 09/27/07    see pmh  . Appendectomy     exploratory, ruptured appendix, RLQ abscess  . Small bowel obstrudtion     d/t  appendicitis  . Mallory-weiss tear     epinephrine injection (6 units lost)   History   Social History  . Marital Status: Married    Spouse Name: N/A    Number of Children: 3  . Years of Education: N/A   Occupational History  . 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 Smoking status: Former Smoker    Quit date: 06/30/1971  . Smokeless tobacco: Never Used     Comment: quit tobacco remotely , ~ 1 ppd  . Alcohol Use: No  . Drug Use: No  . Sexually Active: Not on file   Other Topics Concern  . Not on file   Social History Narrative   One son lives in [place], goes there from time to time    Family History  Problem Relation Age of Onset  . Cancer Mother     of male organs, details unknown  . Uterine cancer Mother   . Colon cancer Neg Hx   . Prostate cancer Neg Hx   . Diabetes Neg Hx   . Heart attack Maternal Uncle   . Heart disease Maternal Uncle      Review of Systems Denies any chest pain or shortness of breath. Very rarely has some edema at the ankles. No nausea, vomiting, diarrhea or blood in the stools No dysuria or gross hematuria.     Objective:   Physical Exam General -- alert, well-developed  Neck --no thyromegaly   HEENT -- TMs normal, throat w/o redness, face symmetric and not tender to palpation,  nose is slightly congested Lungs -- normal respiratory effort, no intercostal retractions, no accessory muscle use, and normal breath sounds.   Heart-- normal rate, regular rhythm, no murmur, and  no gallop.   Abdomen--soft, non-tender, no distention, well-healed surgical scars  Extremities-- trace peri ankle edema Rectal-- No external abnormalities , Hemoccult negative Prostate:   Slightly enlarged prostate without induration, tenderness or nodules Neurologic-- alert & oriented X3 and strength normal in all extremities. Psych-- Cognition and judgment appear intact. Alert and cooperative with normal attention span and concentration.  not anxious appearing and not depressed appearing.       Assessment & Plan:

## 2012-06-29 NOTE — Assessment & Plan Note (Signed)
Check FLP 

## 2012-06-29 NOTE — Assessment & Plan Note (Signed)
TSHs within normal the last time we checked a few months ago

## 2012-06-29 NOTE — Patient Instructions (Addendum)
Fall Prevention and Home Safety Falls cause injuries and can affect all age groups. It is possible to use preventive measures to significantly decrease the likelihood of falls. There are many simple measures which can make your home safer and prevent falls. OUTDOORS  Repair cracks and edges of walkways and driveways.  Remove high doorway thresholds.  Trim shrubbery on the main path into your home.  Have good outside lighting.  Clear walkways of tools, rocks, debris, and clutter.  Check that handrails are not broken and are securely fastened. Both sides of steps should have handrails.  Have leaves, snow, and ice cleared regularly.  Use sand or salt on walkways during winter months.  In the garage, clean up grease or oil spills. BATHROOM  Install night lights.  Install grab bars by the toilet and in the tub and shower.  Use non-skid mats or decals in the tub or shower.  Place a plastic non-slip stool in the shower to sit on, if needed.  Keep floors dry and clean up all water on the floor immediately.  Remove soap buildup in the tub or shower on a regular basis.  Secure bath mats with non-slip, double-sided rug tape.  Remove throw rugs and tripping hazards from the floors. BEDROOMS  Install night lights.  Make sure a bedside light is easy to reach.  Do not use oversized bedding.  Keep a telephone by your bedside.  Have a firm chair with side arms to use for getting dressed.  Remove throw rugs and tripping hazards from the floor. KITCHEN  Keep handles on pots and pans turned toward the center of the stove. Use back burners when possible.  Clean up spills quickly and allow time for drying.  Avoid walking on wet floors.  Avoid hot utensils and knives.  Position shelves so they are not too high or low.  Place commonly used objects within easy reach.  If necessary, use a sturdy step stool with a grab bar when reaching.  Keep electrical cables out of the  way.  Do not use floor polish or wax that makes floors slippery. If you must use wax, use non-skid floor wax.  Remove throw rugs and tripping hazards from the floor. STAIRWAYS  Never leave objects on stairs.  Place handrails on both sides of stairways and use them. Fix any loose handrails. Make sure handrails on both sides of the stairways are as long as the stairs.  Check carpeting to make sure it is firmly attached along stairs. Make repairs to worn or loose carpet promptly.  Avoid placing throw rugs at the top or bottom of stairways, or properly secure the rug with carpet tape to prevent slippage. Get rid of throw rugs, if possible.  Have an electrician put in a light switch at the top and bottom of the stairs. OTHER FALL PREVENTION TIPS  Wear low-heel or rubber-soled shoes that are supportive and fit well. Wear closed toe shoes.  When using a stepladder, make sure it is fully opened and both spreaders are firmly locked. Do not climb a closed stepladder.  Add color or contrast paint or tape to grab bars and handrails in your home. Place contrasting color strips on first and last steps.  Learn and use mobility aids as needed. Install an electrical emergency response system.  Turn on lights to avoid dark areas. Replace light bulbs that burn out immediately. Get light switches that glow.  Arrange furniture to create clear pathways. Keep furniture in the same place.    Firmly attach carpet with non-skid or double-sided tape.  Eliminate uneven floor surfaces.  Select a carpet pattern that does not visually hide the edge of steps.  Be aware of all pets. OTHER HOME SAFETY TIPS  Set the water temperature for 120 F (48.8 C).  Keep emergency numbers on or near the telephone.  Keep smoke detectors on every level of the home and near sleeping areas. Document Released: 07/29/2002 Document Revised: 02/07/2012 Document Reviewed: 10/28/2011 [facility] Patient Information 2013  [place], [place].  Flonase 2 sprays in each side of the nose every night. Call if the cough is not improving  in the next 2 or 3 weeks, if it is improving, stay on Flonase

## 2012-07-01 LAB — URINE CULTURE: Organism ID, Bacteria: NO GROWTH

## 2012-07-04 ENCOUNTER — Encounter: Payer: Self-pay | Admitting: *Deleted

## 2012-07-11 ENCOUNTER — Ambulatory Visit (INDEPENDENT_AMBULATORY_CARE_PROVIDER_SITE_OTHER): Payer: Medicare Other | Admitting: Internal Medicine

## 2012-07-11 ENCOUNTER — Encounter: Payer: Self-pay | Admitting: Internal Medicine

## 2012-07-11 VITALS — BP 150/80 | HR 69 | Ht 74.0 in | Wt 265.0 lb

## 2012-07-11 DIAGNOSIS — I4891 Unspecified atrial fibrillation: Secondary | ICD-10-CM | POA: Diagnosis not present

## 2012-07-11 DIAGNOSIS — I10 Essential (primary) hypertension: Secondary | ICD-10-CM

## 2012-07-11 DIAGNOSIS — E785 Hyperlipidemia, unspecified: Secondary | ICD-10-CM

## 2012-07-11 DIAGNOSIS — I429 Cardiomyopathy, unspecified: Secondary | ICD-10-CM

## 2012-07-11 LAB — CBC WITH DIFFERENTIAL/PLATELET
Basophils Absolute: 0 10*3/uL (ref 0.0–0.1)
HCT: 44.1 % (ref 39.0–52.0)
Hemoglobin: 14.8 g/dL (ref 13.0–17.0)
Lymphs Abs: 1.9 10*3/uL (ref 0.7–4.0)
MCV: 94.4 fl (ref 78.0–100.0)
Monocytes Absolute: 0.7 10*3/uL (ref 0.1–1.0)
Neutro Abs: 2.9 10*3/uL (ref 1.4–7.7)
Platelets: 205 10*3/uL (ref 150.0–400.0)
RDW: 13 % (ref 11.5–14.6)

## 2012-07-11 LAB — BASIC METABOLIC PANEL
BUN: 19 mg/dL (ref 6–23)
CO2: 24 mEq/L (ref 19–32)
Chloride: 108 mEq/L (ref 96–112)
Glucose, Bld: 91 mg/dL (ref 70–99)
Potassium: 4.3 mEq/L (ref 3.5–5.1)
Sodium: 139 mEq/L (ref 135–145)

## 2012-07-11 MED ORDER — LOSARTAN POTASSIUM 50 MG PO TABS: 50.0000 mg | ORAL_TABLET | Freq: Every day | ORAL | Status: DC

## 2012-07-11 NOTE — Assessment & Plan Note (Signed)
Tolerating amiodarone without symptomatic recurrent atrial fibrillation. He again declined anticoagulation

## 2012-07-11 NOTE — Progress Notes (Signed)
  HPI  Antonio [name] is a 78 y.o. male  seen in followup for paroxysmal atrial fibrillation associated with a tachycardia-induced cardiomyopathy which has now normalized. He takes amiodarone.  Blood pressures have been well controlled at home although high here  We have discussed anticoagulation in number of occasions. He is still on aspirin.  The patient denies chest pain, shortness of breath, nocturnal dyspnea, orthopnea    There have been no palpitations, lightheadedness or syncope.   TSH was normal earlier this year. Blood pressure was recently elevated and amlodipine was increased Past Medical History  Diagnosis Date  . Hypertension   . Cardiomyopathy     secondary to Tachycardia. A-Acute on chronic admit for CHF 6/08. B- CHF coupled with atrial fib RVR electrophysiology study 09/27/07.  A- Multiple flutters and Atrial fib- no ablation performed b- ibutilide cardoversion c- amiordarone load- d/c in sinus rhyrthm d- coumadin anticoagulation- stopped at time of GI bleed 11/09  . Mallory - Weiss tear     esophagitis, 6 unit hemorrhage- EPI injections   . Glaucoma(365)   . Atrial fibrillation   . Appendicitis     SBP 7/09 ruputre w/ RLQ abscess 03/16/08  . Hyperlipidemia   . Blood transfusion     X6 WITH APPENDECTOMY  . BPH (benign prostatic hyperplasia) 06/29/2011    Past Surgical History  Procedure Date  . Hemorrhoid surgery   . Tee/dccv 02/07/07  . Ep study 09/27/07    see pmh  . Appendectomy     exploratory, ruptured appendix, RLQ abscess  . Small bowel obstrudtion     d/t  appendicitis  . Mallory-weiss tear     epinephrine injection (6 units lost)    Current Outpatient Prescriptions  Medication Sig Dispense Refill  . amiodarone (PACERONE) 200 MG tablet Take 1/2 tab as directed  30 tablet  5  . amLODipine (NORVASC) 10 MG tablet Take 1 tablet (10 mg total) by mouth daily.  90 tablet  0  . aspirin 81 MG tablet Take 81 mg by mouth daily.      . bisoprolol (ZEBETA) 5 MG  tablet 1/2 by mouth bid.  30 tablet  0  . dorzolamide (TRUSOPT) 2 % ophthalmic solution Place 1 drop into both eyes 2 (two) times daily.        . fluticasone (FLONASE) 50 MCG/ACT nasal spray Place 2 sprays into the nose daily.  16 g  6  . Multiple Vitamins-Minerals (CENTRUM) tablet Take 1 tablet by mouth daily.        . timolol (TIMOPTIC) 0.5 % ophthalmic solution Place 1 drop into both eyes daily.          No Known Allergies  Review of Systems negative except from HPI and PMH  Physical Exam BP 150/80  Pulse 69  Ht 6\' 2"  (1.88 m)  Wt 265 lb (120.203 kg)  BMI 34.02 kg/m2 Well developed and well nourished in no acute distress HENT normal E scleral and icterus clear Neck Supple JVP flat; carotids brisk and full Clear to ausculation Regular rate and rhythm, no murmurs gallops or rub Soft with active bowel sounds No clubbing cyanosis 1-2+ Edema Alert and oriented, grossly normal motor and sensory function Skin Warm and Dry  ECG   electrocardiogram demonstrates sinus rhythm at 69 Intervals 20/09/43 Axis leftward -60 I Assessment and  Plan

## 2012-07-11 NOTE — Assessment & Plan Note (Signed)
Continue current medications. 

## 2012-07-11 NOTE — Assessment & Plan Note (Signed)
Blood pressure is mildly elevated today. However, he does have significant edema. This may be a consequence of amlodipine and rather than adding a diuretic I think we should change his blood pressure medications. Creatinine was normal just a few weeks ago. We will begin him on losartan 50 and have him followup with Dr. Drue Novel in 2-3 weeks time at which time a metabolic profile be necessary

## 2012-07-11 NOTE — Patient Instructions (Signed)
Stop Amlodipine.  Start Losartan 50mg  daily.  Labs today:  BMET & CBC  Follow up with Dr. Nicole Kindred in 2-3 weeks.

## 2012-07-11 NOTE — Assessment & Plan Note (Signed)
LDL was recently measured at 150. He is disinclined to take medication

## 2012-07-13 DIAGNOSIS — Z23 Encounter for immunization: Secondary | ICD-10-CM | POA: Diagnosis not present

## 2012-07-23 ENCOUNTER — Other Ambulatory Visit: Payer: Self-pay | Admitting: Internal Medicine

## 2012-07-23 MED ORDER — BISOPROLOL FUMARATE 5 MG PO TABS: ORAL_TABLET | ORAL | Status: DC

## 2012-07-23 NOTE — Telephone Encounter (Signed)
Refill done.  

## 2012-07-23 NOTE — Telephone Encounter (Signed)
bisoprplol 5mg  tablet Qty: 30 1/2 by mouth bid

## 2012-07-26 DIAGNOSIS — H4011X0 Primary open-angle glaucoma, stage unspecified: Secondary | ICD-10-CM | POA: Diagnosis not present

## 2012-07-26 DIAGNOSIS — H409 Unspecified glaucoma: Secondary | ICD-10-CM | POA: Diagnosis not present

## 2012-07-26 DIAGNOSIS — H04129 Dry eye syndrome of unspecified lacrimal gland: Secondary | ICD-10-CM | POA: Diagnosis not present

## 2012-07-26 DIAGNOSIS — Z961 Presence of intraocular lens: Secondary | ICD-10-CM | POA: Diagnosis not present

## 2012-07-31 ENCOUNTER — Encounter: Payer: Self-pay | Admitting: Internal Medicine

## 2012-07-31 ENCOUNTER — Ambulatory Visit (INDEPENDENT_AMBULATORY_CARE_PROVIDER_SITE_OTHER): Payer: Medicare Other | Admitting: Internal Medicine

## 2012-07-31 VITALS — BP 178/90 | HR 78 | Temp 97.4°F | Wt 265.0 lb

## 2012-07-31 DIAGNOSIS — I10 Essential (primary) hypertension: Secondary | ICD-10-CM | POA: Diagnosis not present

## 2012-07-31 MED ORDER — LOSARTAN POTASSIUM 100 MG PO TABS: 100.0000 mg | ORAL_TABLET | Freq: Every day | ORAL | Status: DC

## 2012-07-31 NOTE — Assessment & Plan Note (Addendum)
Recently switched from amlodipine to losartan due to edema, edema according to the patient is slightly better but not gone.  BP today slightly elevated, will increase losartan from 50 to 100 mg. Since we are changing his losartan dose today, instead of doing a BMP today and repeated in 2 weeks we'll ask him to come back in 2 weeks for labs, see instructions

## 2012-07-31 NOTE — Patient Instructions (Addendum)
Please increase losartan to 100 mg a day Come back in 2 weeks for labs only : BMP dx HTN Check the  blood pressure 2 or 3 times a week, be sure it is between 110/60 and 135/85. If it is consistently higher or lower, let me know

## 2012-07-31 NOTE — Progress Notes (Signed)
  Subjective:    Patient ID: Antonio [name], male    DOB: 03-Sep-1928, 78 y.o.   MRN: [id number]  HPI Here for BP eval 2 weeks ago went to cardiology, was noted to have edema, amlodipine was discontinued and he started losartan. Good compliance and tolerance.  Past Medical History  Diagnosis Date  . Hypertension   . Cardiomyopathy     secondary to Tachycardia. A-Acute on chronic admit for CHF 6/08. B- CHF coupled with atrial fib RVR electrophysiology study 09/27/07.  A- Multiple flutters and Atrial fib- no ablation performed b- ibutilide cardoversion c- amiordarone load- d/c in sinus rhyrthm d- coumadin anticoagulation- stopped at time of GI bleed 11/09  . Mallory - Weiss tear     esophagitis, 6 unit hemorrhage- EPI injections   . Glaucoma(365)   . Atrial fibrillation   . Appendicitis     SBP 7/09 ruputre w/ RLQ abscess 03/16/08  . Hyperlipidemia   . Blood transfusion     X6 WITH APPENDECTOMY  . BPH (benign prostatic hyperplasia) 06/29/2011    Past Surgical History  Procedure Date  . Hemorrhoid surgery   . Tee/dccv 02/07/07  . Ep study 09/27/07    see pmh  . Appendectomy     exploratory, ruptured appendix, RLQ abscess  . Small bowel obstrudtion     d/t  appendicitis  . Mallory-weiss tear     epinephrine injection (6 units lost)   History   Social History  . Marital Status: Married    Spouse Name: N/A    Number of Children: 3  . Years of Education: N/A   Occupational History  . Cabinetmaker     works part time   Social History Main Topics  . Smoking status: Former Smoker    Quit date: 06/30/1971  . Smokeless tobacco: Never Used     Comment: quit tobacco remotely , ~ 1 ppd  . Alcohol Use: No  . Drug Use: No  . Sexually Active: Not on file   Other Topics Concern  . Not on file   Social History Narrative   One son lives in [place], goes there from time to time     Review of Systems Edema has decreased only "a little ". Ambulatory BPs: 3 out of 5 times systolic  blood pressure was in the 140s, DBPs normal Denies any shortness of breath or orthopnea. No cough. Admits to some stress, "maybe that's why my BP is high today".     Objective:   Physical Exam General -- alert, well-developed   Neck --no JVD at 45     Lungs -- normal respiratory effort, no intercostal retractions, no accessory muscle use, and normal breath sounds.    Heart-- normal rate, regular rhythm, no murmur, and no gallop.    Extremities-- trace peri ankle edema  Psych-- Cognition and judgment appear intact. Alert and cooperative with normal attention span and concentration.  not anxious appearing and not depressed appearing.      Assessment & Plan:

## 2012-08-10 ENCOUNTER — Other Ambulatory Visit (INDEPENDENT_AMBULATORY_CARE_PROVIDER_SITE_OTHER): Payer: Medicare Other

## 2012-08-10 DIAGNOSIS — I10 Essential (primary) hypertension: Secondary | ICD-10-CM

## 2012-08-10 LAB — BASIC METABOLIC PANEL
BUN: 16 mg/dL (ref 6–23)
Calcium: 9.2 mg/dL (ref 8.4–10.5)
Creatinine, Ser: 1.2 mg/dL (ref 0.4–1.5)
GFR: 61.75 mL/min (ref >60.00)

## 2012-08-10 NOTE — Progress Notes (Signed)
LABS ONLY  

## 2012-08-14 ENCOUNTER — Encounter: Payer: Self-pay | Admitting: *Deleted

## 2012-08-20 ENCOUNTER — Telehealth: Payer: Self-pay | Admitting: Internal Medicine

## 2012-08-20 MED ORDER — BISOPROLOL FUMARATE 5 MG PO TABS: ORAL_TABLET | ORAL | Status: DC

## 2012-08-20 NOTE — Telephone Encounter (Signed)
Refill: Bisoprolol fumarate 5 mg tab #30. Take 1/2 tablet by mouth twice daily. Last fill 07-23-12

## 2012-08-20 NOTE — Telephone Encounter (Signed)
Patient requesting 90 day supply.

## 2012-08-20 NOTE — Telephone Encounter (Signed)
Refill done.  

## 2012-09-10 ENCOUNTER — Encounter: Payer: Self-pay | Admitting: Gastroenterology

## 2012-10-30 ENCOUNTER — Telehealth: Payer: Self-pay | Admitting: Internal Medicine

## 2012-10-30 MED ORDER — LOSARTAN POTASSIUM 100 MG PO TABS: 100.0000 mg | ORAL_TABLET | Freq: Every day | ORAL | Status: DC

## 2012-10-30 NOTE — Telephone Encounter (Signed)
refill Losartan (Tab) 100 MG Take 1 tablet (100 mg total) by mouth daily #30 last fill 2.11.14

## 2012-10-30 NOTE — Telephone Encounter (Signed)
Rx sent 

## 2012-11-06 DIAGNOSIS — H4011X0 Primary open-angle glaucoma, stage unspecified: Secondary | ICD-10-CM | POA: Diagnosis not present

## 2012-11-06 DIAGNOSIS — H409 Unspecified glaucoma: Secondary | ICD-10-CM | POA: Diagnosis not present

## 2012-11-13 ENCOUNTER — Ambulatory Visit (INDEPENDENT_AMBULATORY_CARE_PROVIDER_SITE_OTHER): Payer: Medicare Other | Admitting: Gastroenterology

## 2012-11-13 ENCOUNTER — Encounter: Payer: Self-pay | Admitting: Gastroenterology

## 2012-11-13 VITALS — BP 152/86 | HR 76 | Ht 74.75 in | Wt 261.8 lb

## 2012-11-13 DIAGNOSIS — Z86010 Personal history of colonic polyps: Secondary | ICD-10-CM

## 2012-11-13 MED ORDER — MOVIPREP 100 G PO SOLR: 1.0000 | Freq: Once | ORAL | Status: DC

## 2012-11-13 NOTE — Progress Notes (Signed)
Review of pertinent gastrointestinal problems: 1. Adenomatous polyps: colonoscopy 08/2011 Christella Hartigan found 3 polyps; one was 1-2cm, sessile, located near IC valve, removed piecemeal and path showed foci of HGD; repeat colonsocopy 10/2011 found small residual adenoma at site, no HGD this time.   HPI: This is a    very pleasant 79 year old man whom I last saw about a year ago at the time of her repeat colonoscopy. See results summarized above. Since then he has felt well. No significant changes in his overall health. Significant   Review of systems: Pertinent positive and negative review of systems were noted in the above HPI section. Complete review of systems was performed and was otherwise normal.    Past Medical History  Diagnosis Date  . Hypertension   . Cardiomyopathy     secondary to Tachycardia. A-Acute on chronic admit for CHF 6/08. B- CHF coupled with atrial fib RVR electrophysiology study 09/27/07.  A- Multiple flutters and Atrial fib- no ablation performed b- ibutilide cardoversion c- amiordarone load- d/c in sinus rhyrthm d- coumadin anticoagulation- stopped at time of GI bleed 11/09  . Mallory - Weiss tear     esophagitis, 6 unit hemorrhage- EPI injections   . Glaucoma(365)   . Atrial fibrillation   . Appendicitis     SBP 7/09 ruputre w/ RLQ abscess 03/16/08  . Hyperlipidemia   . Blood transfusion     X6 WITH APPENDECTOMY  . BPH (benign prostatic hyperplasia) 06/29/2011  . Small bowel obstruction     Past Surgical History  Procedure Laterality Date  . Hemorrhoid surgery    . Tee/dccv  02/07/07  . Ep study  09/27/07    see pmh  . Appendectomy      exploratory, ruptured appendix, RLQ abscess    Current Outpatient Prescriptions  Medication Sig Dispense Refill  . amiodarone (PACERONE) 200 MG tablet Take 1/2 tab as directed  30 tablet  5  . aspirin 81 MG tablet Take 81 mg by mouth daily.      . bisoprolol (ZEBETA) 5 MG tablet 1/2 by mouth bid.  90 tablet  2  . dorzolamide  (TRUSOPT) 2 % ophthalmic solution Place 1 drop into both eyes 2 (two) times daily.        Marland Kitchen losartan (COZAAR) 100 MG tablet Take 1 tablet (100 mg total) by mouth daily.  30 tablet  1  . Multiple Vitamins-Minerals (CENTRUM) tablet Take 1 tablet by mouth daily.        . timolol (TIMOPTIC) 0.5 % ophthalmic solution Place 1 drop into both eyes daily.         No current facility-administered medications for this visit.    Allergies as of 11/13/2012  . (No Known Allergies)    Family History  Problem Relation Age of Onset  . Uterine cancer Mother   . Colon cancer Neg Hx   . Prostate cancer Neg Hx   . Diabetes Neg Hx   . Heart attack Maternal Uncle   . Heart disease Maternal Uncle     History   Social History  . Marital Status: Married    Spouse Name: N/A    Number of Children: 3  . Years of Education: N/A   Occupational History  . Cabinetmaker     works part time   Social History Main Topics  . Smoking status: Former Smoker    Quit date: 06/30/1971  . Smokeless tobacco: Never Used     Comment: quit tobacco remotely , ~ 1 ppd  .  Alcohol Use: No  . Drug Use: No  . Sexually Active: Not on file   Other Topics Concern  . Not on file   Social History Narrative   One son lives in [place], goes there from time to time                 Physical Exam: BP 152/86  Pulse 76  Ht 6' 2.75" (1.899 m)  Wt 261 lb 12.8 oz (118.752 kg)  BMI 32.93 kg/m2 Constitutional: generally well-appearing Psychiatric: alert and oriented x3 Eyes: extraocular movements intact Mouth: oral pharynx moist, no lesions Neck: supple no lymphadenopathy Cardiovascular: heart regular rate and rhythm Lungs: clear to auscultation bilaterally Abdomen: soft, nontender, nondistended, no obvious ascites, no peritoneal signs, normal bowel sounds Extremities: no lower extremity edema bilaterally Skin: no lesions on visible extremities    Assessment and plan: 79 y.o. male with   significant  adenomatous polyp removed a little over a year ago   like to repeat colonoscopy at his soonest convenience. As long as there is no further significant adenoma at the site I think he would be fine to stop surveillance colonoscopies.

## 2012-11-13 NOTE — Patient Instructions (Addendum)
You will be set up for a colonoscopy for history of polyps.                                                We are excited to introduce MyChart, a new best-in-class service that provides you online access to important information in your electronic medical record. We want to make it easier for you to view your health information - all in one secure location - when and where you need it. We expect MyChart will enhance the quality of care and service we provide.  When you register for MyChart, you can:    View your test results.    Request appointments and receive appointment reminders via email.    Request medication renewals.    View your medical history, allergies, medications and immunizations.    Communicate with your physician's office through a password-protected site.    Conveniently print information such as your medication lists.  To find out if MyChart is right for you, please talk to a member of our clinical staff today. We will gladly answer your questions about this free health and wellness tool.  If you are age 77 or older and want a member of your family to have access to your record, you must provide written consent by completing a proxy form available at our office. Please speak to our clinical staff about guidelines regarding accounts for patients younger than age 61.  As you activate your MyChart account and need any technical assistance, please call the MyChart technical support line at [phone]) or email your question to [email or web address]. If you email your question(s), please include your name, a return phone number and the best time to reach you.  If you have non-urgent health-related questions, you can send a message to our office through MyChart at [email or web address]. If you have a medical emergency, call 911.  Thank you for using MyChart as your new health and wellness resource!   MyChart licensed from [employer],   [id number]. Patents Pending.

## 2012-12-18 ENCOUNTER — Ambulatory Visit (AMBULATORY_SURGERY_CENTER): Payer: Medicare Other | Admitting: Gastroenterology

## 2012-12-18 ENCOUNTER — Encounter: Payer: Self-pay | Admitting: Gastroenterology

## 2012-12-18 VITALS — BP 111/63 | HR 51 | Temp 97.3°F | Resp 15 | Ht 74.75 in | Wt 261.0 lb

## 2012-12-18 DIAGNOSIS — Z860100 Personal history of colon polyps, unspecified: Secondary | ICD-10-CM

## 2012-12-18 DIAGNOSIS — K5730 Diverticulosis of large intestine without perforation or abscess without bleeding: Secondary | ICD-10-CM | POA: Diagnosis not present

## 2012-12-18 DIAGNOSIS — D126 Benign neoplasm of colon, unspecified: Secondary | ICD-10-CM | POA: Diagnosis not present

## 2012-12-18 DIAGNOSIS — Z1211 Encounter for screening for malignant neoplasm of colon: Secondary | ICD-10-CM

## 2012-12-18 LAB — HM COLONOSCOPY

## 2012-12-18 MED ORDER — SODIUM CHLORIDE 0.9 % IV SOLN: 500.0000 mL | INTRAVENOUS | Status: DC

## 2012-12-18 NOTE — Op Note (Signed)
[facility] [address] [place], [address]   COLONOSCOPY PROCEDURE REPORT  PATIENT: Antonio [name], Antonio [name]  MR#: [id number] BIRTHDATE: [birth year] , 83  yrs. old GENDER: Male ENDOSCOPIST: Rachael Fee, MD  PROCEDURE DATE:  12/18/2012 PROCEDURE:   Colonoscopy with snare polypectomy ASA CLASS:   Class II INDICATIONS:colonoscopy 08/2011 Christella Hartigan found 3 polyps; one was 1-2cm, sessile, located near IC valve, removed piecemeal and path showed foci of HGD; repeat colonsocopy 10/2011 found small residual adenoma at site, no HGD this time. MEDICATIONS: Fentanyl 50 mcg IV, Versed 5 mg IV, and These medications were titrated to patient response per physician's verbal order  DESCRIPTION OF PROCEDURE:   After the risks benefits and alternatives of the procedure were thoroughly explained, informed consent was obtained.  A digital rectal exam revealed no abnormalities of the rectum.   The LB CF-[id number]  endoscope was introduced through the anus and advanced to the cecum, which was identified by both the appendix and ileocecal valve. No adverse events experienced.   The quality of the prep was good.  The instrument was then slowly withdrawn as the colon was fully examined.  COLON FINDINGS: One small polyp in cecum at site of previous polypectomy.  This was sessile, 2mm across, removed with cold snare, sent to pathology.  There was diverticulosis in left colon. The examination was otherwise normal.  Retroflexed views revealed no abnormalities. The time to cecum=8 minutes 42 seconds. Withdrawal time=5 minutes 35 seconds.  The scope was withdrawn and the procedure completed. COMPLICATIONS: There were no complications.  ENDOSCOPIC IMPRESSION: One small polyp in cecum at site of previous polypectomy.  This was sessile, 2mm across, removed with cold snare, sent to pathology. There was diverticulosis in left colon.  The examination was otherwise  normal.  RECOMMENDATIONS: You will receive a letter within 1-2 weeks with the results of your biopsy as well as final recommendations.  Please call my office if you have not received a letter after 3 weeks.   eSigned:  Rachael Fee, MD 12/18/2012 2:11 PM   cc: Willow Ora, MD

## 2012-12-18 NOTE — Progress Notes (Signed)
No egg or soy allergy. emw 

## 2012-12-18 NOTE — Progress Notes (Signed)
Pt's heart rate dropped to 38.  Pt warm, dry and pink.  Dr. Christella Hartigan notifies.  Maw

## 2012-12-18 NOTE — Progress Notes (Signed)
Pt arouseable post procedure.  He was asking questions.  Heart rate increased into 50's. Maw

## 2012-12-18 NOTE — Progress Notes (Signed)
Patient did not experience any of the following events: a burn prior to discharge; a fall within the facility; wrong site/side/patient/procedure/implant event; or a hospital transfer or hospital admission upon discharge from the facility. (G8907) Patient did not have preoperative order for IV antibiotic SSI prophylaxis. (G8918)  

## 2012-12-18 NOTE — Progress Notes (Signed)
Abdominal pressure by tech to aid the scope advancement. Maw

## 2012-12-18 NOTE — Patient Instructions (Addendum)

## 2012-12-19 ENCOUNTER — Telehealth: Payer: Self-pay

## 2012-12-19 NOTE — Telephone Encounter (Signed)
Left a message on the pt's am to call if any questions or concerns [phone]. Maw

## 2012-12-26 ENCOUNTER — Encounter: Payer: Self-pay | Admitting: Gastroenterology

## 2013-01-01 ENCOUNTER — Telehealth: Payer: Self-pay | Admitting: General Practice

## 2013-01-01 MED ORDER — LOSARTAN POTASSIUM 100 MG PO TABS: 100.0000 mg | ORAL_TABLET | Freq: Every day | ORAL | Status: DC

## 2013-01-01 NOTE — Telephone Encounter (Signed)
Med filled.  

## 2013-02-06 ENCOUNTER — Other Ambulatory Visit: Payer: Self-pay | Admitting: *Deleted

## 2013-02-06 MED ORDER — AMIODARONE HCL 200 MG PO TABS: ORAL_TABLET | ORAL | Status: DC

## 2013-02-06 NOTE — Telephone Encounter (Signed)
Fax Received. Refill Completed. Antonio [name] (R.M.A)   

## 2013-03-06 ENCOUNTER — Other Ambulatory Visit: Payer: Self-pay | Admitting: *Deleted

## 2013-03-06 DIAGNOSIS — I10 Essential (primary) hypertension: Secondary | ICD-10-CM

## 2013-03-06 MED ORDER — LOSARTAN POTASSIUM 100 MG PO TABS: 100.0000 mg | ORAL_TABLET | Freq: Every day | ORAL | Status: DC

## 2013-03-07 DIAGNOSIS — H4011X0 Primary open-angle glaucoma, stage unspecified: Secondary | ICD-10-CM | POA: Diagnosis not present

## 2013-03-07 DIAGNOSIS — H409 Unspecified glaucoma: Secondary | ICD-10-CM | POA: Diagnosis not present

## 2013-05-07 ENCOUNTER — Other Ambulatory Visit: Payer: Self-pay | Admitting: *Deleted

## 2013-05-07 DIAGNOSIS — I10 Essential (primary) hypertension: Secondary | ICD-10-CM

## 2013-05-07 MED ORDER — LOSARTAN POTASSIUM 100 MG PO TABS: 100.0000 mg | ORAL_TABLET | Freq: Every day | ORAL | Status: DC

## 2013-05-07 NOTE — Telephone Encounter (Signed)
Rx has been refilled for cozaar 100 mg

## 2013-06-28 ENCOUNTER — Telehealth: Payer: Self-pay

## 2013-06-28 NOTE — Telephone Encounter (Addendum)
Left message for call back--unable to reach pre visit  identifiable  Flu vaccine? Shingles vaccine--patient declines per OV notes 07/01/2013 Tdap--112011 Pneumonia vaccine--03/2008 PSA--06/2012---1.41 CCS--11/2012--per Dr Christella Hartigan; adenomatous polyp--due to age no recall necessary--to be determined with PCP per letter

## 2013-07-01 ENCOUNTER — Encounter: Payer: Self-pay | Admitting: Internal Medicine

## 2013-07-01 ENCOUNTER — Ambulatory Visit (INDEPENDENT_AMBULATORY_CARE_PROVIDER_SITE_OTHER): Payer: Medicare Other | Admitting: Internal Medicine

## 2013-07-01 VITALS — BP 152/94 | HR 65 | Temp 97.6°F | Ht 74.0 in | Wt 255.8 lb

## 2013-07-01 DIAGNOSIS — N400 Enlarged prostate without lower urinary tract symptoms: Secondary | ICD-10-CM | POA: Diagnosis not present

## 2013-07-01 DIAGNOSIS — E079 Disorder of thyroid, unspecified: Secondary | ICD-10-CM | POA: Diagnosis not present

## 2013-07-01 DIAGNOSIS — Z0000 Encounter for general adult medical examination without abnormal findings: Secondary | ICD-10-CM

## 2013-07-01 DIAGNOSIS — I4891 Unspecified atrial fibrillation: Secondary | ICD-10-CM | POA: Diagnosis not present

## 2013-07-01 DIAGNOSIS — E785 Hyperlipidemia, unspecified: Secondary | ICD-10-CM

## 2013-07-01 DIAGNOSIS — Z23 Encounter for immunization: Secondary | ICD-10-CM | POA: Diagnosis not present

## 2013-07-01 DIAGNOSIS — I10 Essential (primary) hypertension: Secondary | ICD-10-CM | POA: Diagnosis not present

## 2013-07-01 LAB — BASIC METABOLIC PANEL
GFR: 68.85 mL/min (ref >60.00)
Glucose, Bld: 93 mg/dL (ref 70–99)
Potassium: 4.2 mEq/L (ref 3.5–5.1)
Sodium: 140 mEq/L (ref 135–145)

## 2013-07-01 LAB — CBC WITH DIFFERENTIAL/PLATELET
Basophils Absolute: 0 10*3/uL (ref 0.0–0.1)
Basophils Relative: 0.7 % (ref 0.0–3.0)
Eosinophils Relative: 2.6 % (ref 0.0–5.0)
HCT: 43.8 % (ref 39.0–52.0)
Hemoglobin: 15 g/dL (ref 13.0–17.0)
Lymphs Abs: 1.7 10*3/uL (ref 0.7–4.0)
Monocytes Relative: 8.6 % (ref 3.0–12.0)
Neutro Abs: 3.1 10*3/uL (ref 1.4–7.7)
RDW: 13.6 % (ref 11.5–14.6)

## 2013-07-01 MED ORDER — LOSARTAN POTASSIUM 100 MG PO TABS: 100.0000 mg | ORAL_TABLET | Freq: Every day | ORAL | Status: DC

## 2013-07-01 MED ORDER — BISOPROLOL FUMARATE 5 MG PO TABS: 5.0000 mg | ORAL_TABLET | Freq: Two times a day (BID) | ORAL | Status: DC

## 2013-07-01 MED ORDER — BISOPROLOL FUMARATE 5 MG PO TABS: ORAL_TABLET | ORAL | Status: DC

## 2013-07-01 MED ORDER — AMIODARONE HCL 200 MG PO TABS: 100.0000 mg | ORAL_TABLET | Freq: Every day | ORAL | Status: DC

## 2013-07-01 NOTE — Progress Notes (Signed)
Subjective:    Patient ID: Antonio [name], male    DOB: Dec 22, 1928, 79 y.o.   MRN: [id number]  HPI  Here for Medicare AWV: 1. Risk factors based on Past M, S, F history: reviewed 2. Physical Activities: active, still works part time Radiation protection practitioner ), yard work 3. Depression/mood: neg screening 4. Hearing: slt  decreased per pt, however no problems noted , some tinnitus in the L x years, used to work in a noisy environment, not getting worse   5. ADL's: totally independent , still drives   6.  Fall Risk:  no recent falls, counseled, see instructions 7. Home Safety: does feel safe at home 8.Height, weight, &visual acuity: see VS, vision ok, s/p cataract surgery, just saw the eye doctor 9.  Counseling: yes , see below   10. Labs ordered based on risk factors: yes 11.  Referral Coordination, if needed   12.  Care Plan, see a/p   13. Cognitive Assessment: motor skills appropriate for age, cognition and memory seem appropriate  In addition we discussed the following issues HTN-- Good medication compliance, ambulatory BPs range from 09/20/1974, 150/90. Atrial fibrillation, last cardiology note reviewed, still declines anticoagulation, he is asymptomatic. Lower extremity edema, about the same as before, does not bother the patient.  Past Medical History  Diagnosis Date  . Hypertension   . Cardiomyopathy     secondary to Tachycardia. A-Acute on chronic admit for CHF 6/08. B- CHF coupled with atrial fib RVR electrophysiology study 09/27/07.  A- Multiple flutters and Atrial fib- no ablation performed b- ibutilide cardoversion c- amiordarone load- d/c in sinus rhyrthm d- coumadin anticoagulation- stopped at time of GI bleed 11/09  . Mallory - Weiss tear     esophagitis, 6 unit hemorrhage- EPI injections   . Glaucoma   . Atrial fibrillation   . Appendicitis     SBP 7/09 ruputre w/ RLQ abscess 03/16/08  . Hyperlipidemia   . BPH (benign prostatic hyperplasia) 06/29/2011  . Small bowel obstruction    . Blood transfusion without reported diagnosis    Past Surgical History  Procedure Laterality Date  . Hemorrhoid surgery    . Tee/dccv  02/07/07  . Ep study  09/27/07    see pmh  . Appendectomy      exploratory, ruptured appendix, RLQ abscess  . Polypectomy    . Colonoscopy     History   Social History  . Marital Status: Married    Spouse Name: N/A    Number of Children: 3  . Years of Education: N/A   Occupational History  . Cabinetmaker     works part time   Social History Main Topics  . Smoking status: Former Smoker    Quit date: 06/30/1971  . Smokeless tobacco: Never Used     Comment: quit tobacco remotely , ~ 1 ppd  . Alcohol Use: No  . Drug Use: No  . Sexual Activity: Not on file   Other Topics Concern  . Not on file   Social History Narrative   One son lives in [place], goes there from time to time             Family History  Problem Relation Age of Onset  . Uterine cancer Mother   . Colon cancer Neg Hx   . Prostate cancer Neg Hx   . Diabetes Neg Hx   . Heart attack Maternal Uncle   . Heart disease Maternal Uncle      Review  of Systems No  CP, SOB  No orthopnea , DOE Denies  nausea, vomiting diarrhea Denies  blood in the stools (-) cough, sputum production; some nasal congestion on-off (-) wheezing, chest congestion No dysuria, gross hematuria, difficulty urinating          Objective:   Physical Exam BP 152/94  Pulse 65  Temp(Src) 97.6 F (36.4 C)  Ht 6\' 2"  (1.88 m)  Wt 255 lb 12.8 oz (116.03 kg)  BMI 32.83 kg/m2  SpO2 96% General -- alert, well-developed, NAD.  Neck --no thyromegaly  Lungs -- normal respiratory effort, no intercostal retractions, no accessory muscle use, and normal breath sounds.  Heart-- normal rate, regular rhythm, no murmur.  Abdomen-- Not distended, good bowel sounds,soft, non-tender. Well healed surgical scar Extremities-- trace edema over the sock line  bilaterally  Neurologic--  alert & oriented X3.  Speech normal, gait slt less steady than previous years but appropriate fro age, strength symmetric in all extremities.  Psych-- Cognition and judgment appear intact. Cooperative with normal attention span and concentration. No anxious appearing, no depressed appearing.     Assessment & Plan:

## 2013-07-01 NOTE — Progress Notes (Signed)
Pre visit review using our clinic review tool, if applicable. No additional management support is needed unless otherwise documented below in the visit note. 

## 2013-07-01 NOTE — Assessment & Plan Note (Addendum)
Rate seems well-controlled,  will increase beta blockers mostly for BP control.

## 2013-07-01 NOTE — Assessment & Plan Note (Signed)
In the past and today states clearly that he won't take any medication.

## 2013-07-01 NOTE — Assessment & Plan Note (Addendum)
Ambulatory BPs range from 150 to 130. Has persistent but mild edema even after discontinuation of amlodipine ----> recommend observation for now. Plan: Increase zebeta 5 mg from 1/2 bid  to 1 tab bid  Monitor BPs

## 2013-07-01 NOTE — Assessment & Plan Note (Addendum)
Td 2011 Flu shot -- today pneumonia shot 2009   shingles immunization-- never got it, see previous entry, declining  09-2011 colonoscopy , Cscope again 11/2012--per Dr Christella Hartigan; adenomatous polyp--due to age no recall  (pt and I agree) diet and exercise discussed

## 2013-07-01 NOTE — Assessment & Plan Note (Signed)
Essentially asymptomatic, PSAs have been stable over time.

## 2013-07-01 NOTE — Patient Instructions (Signed)
Get your blood work before you leave  Next visit in 6 months   for a   follow up . No Fasting Please make an appointment    Increase bisoprolol to 1 tablet twice a day Check the  blood pressure 2 or 3 times a  week be sure it is between 110/60 and 140/85. Ideal blood pressure is 120/80. If it is consistently higher or lower, let me know    Fall Prevention and Home Safety Falls cause injuries and can affect all age groups. It is possible to use preventive measures to significantly decrease the likelihood of falls. There are many simple measures which can make your home safer and prevent falls. OUTDOORS  Repair cracks and edges of walkways and driveways.  Remove high doorway thresholds.  Trim shrubbery on the main path into your home.  Have good outside lighting.  Clear walkways of tools, rocks, debris, and clutter.  Check that handrails are not broken and are securely fastened. Both sides of steps should have handrails.  Have leaves, snow, and ice cleared regularly.  Use sand or salt on walkways during winter months.  In the garage, clean up grease or oil spills. BATHROOM  Install night lights.  Install grab bars by the toilet and in the tub and shower.  Use non-skid mats or decals in the tub or shower.  Place a plastic non-slip stool in the shower to sit on, if needed.  Keep floors dry and clean up all water on the floor immediately.  Remove soap buildup in the tub or shower on a regular basis.  Secure bath mats with non-slip, double-sided rug tape.  Remove throw rugs and tripping hazards from the floors. BEDROOMS  Install night lights.  Make sure a bedside light is easy to reach.  Do not use oversized bedding.  Keep a telephone by your bedside.  Have a firm chair with side arms to use for getting dressed.  Remove throw rugs and tripping hazards from the floor. KITCHEN  Keep handles on pots and pans turned toward the center of the stove. Use back burners  when possible.  Clean up spills quickly and allow time for drying.  Avoid walking on wet floors.  Avoid hot utensils and knives.  Position shelves so they are not too high or low.  Place commonly used objects within easy reach.  If necessary, use a sturdy step stool with a grab bar when reaching.  Keep electrical cables out of the way.  Do not use floor polish or wax that makes floors slippery. If you must use wax, use non-skid floor wax.  Remove throw rugs and tripping hazards from the floor. STAIRWAYS  Never leave objects on stairs.  Place handrails on both sides of stairways and use them. Fix any loose handrails. Make sure handrails on both sides of the stairways are as long as the stairs.  Check carpeting to make sure it is firmly attached along stairs. Make repairs to worn or loose carpet promptly.  Avoid placing throw rugs at the top or bottom of stairways, or properly secure the rug with carpet tape to prevent slippage. Get rid of throw rugs, if possible.  Have an electrician put in a light switch at the top and bottom of the stairs. OTHER FALL PREVENTION TIPS  Wear low-heel or rubber-soled shoes that are supportive and fit well. Wear closed toe shoes.  When using a stepladder, make sure it is fully opened and both spreaders are firmly locked. Do  not climb a closed stepladder.  Add color or contrast paint or tape to grab bars and handrails in your home. Place contrasting color strips on first and last steps.  Learn and use mobility aids as needed. Install an electrical emergency response system.  Turn on lights to avoid dark areas. Replace light bulbs that burn out immediately. Get light switches that glow.  Arrange furniture to create clear pathways. Keep furniture in the same place.  Firmly attach carpet with non-skid or double-sided tape.  Eliminate uneven floor surfaces.  Select a carpet pattern that does not visually hide the edge of steps.  Be aware of  all pets. OTHER HOME SAFETY TIPS  Set the water temperature for 120 F (48.8 C).  Keep emergency numbers on or near the telephone.  Keep smoke detectors on every level of the home and near sleeping areas. Document Released: 07/29/2002 Document Revised: 02/07/2012 Document Reviewed: 10/28/2011 [facility] Patient Information 2014 [place], [place].

## 2013-07-01 NOTE — Assessment & Plan Note (Signed)
Labs

## 2013-07-04 DIAGNOSIS — H409 Unspecified glaucoma: Secondary | ICD-10-CM | POA: Diagnosis not present

## 2013-07-04 DIAGNOSIS — H4011X0 Primary open-angle glaucoma, stage unspecified: Secondary | ICD-10-CM | POA: Diagnosis not present

## 2013-07-05 ENCOUNTER — Encounter: Payer: Self-pay | Admitting: *Deleted

## 2013-08-24 ENCOUNTER — Emergency Department (INDEPENDENT_AMBULATORY_CARE_PROVIDER_SITE_OTHER)
Admission: EM | Admit: 2013-08-24 | Discharge: 2013-08-24 | Disposition: A | Discharge status: Home / Self Care | Payer: Medicare Other | Source: Home / Self Care | Attending: Family Medicine | Admitting: Family Medicine

## 2013-08-24 ENCOUNTER — Emergency Department (INDEPENDENT_AMBULATORY_CARE_PROVIDER_SITE_OTHER): Payer: Medicare Other

## 2013-08-24 ENCOUNTER — Encounter (HOSPITAL_COMMUNITY): Payer: Self-pay | Admitting: Emergency Medicine

## 2013-08-24 DIAGNOSIS — R05 Cough: Secondary | ICD-10-CM | POA: Diagnosis not present

## 2013-08-24 DIAGNOSIS — R059 Cough, unspecified: Secondary | ICD-10-CM

## 2013-08-24 DIAGNOSIS — J449 Chronic obstructive pulmonary disease, unspecified: Secondary | ICD-10-CM | POA: Diagnosis not present

## 2013-08-24 MED ORDER — ALBUTEROL SULFATE HFA 108 (90 BASE) MCG/ACT IN AERS: 2.0000 | INHALATION_SPRAY | Freq: Four times a day (QID) | RESPIRATORY_TRACT | Status: DC | PRN

## 2013-08-24 MED ORDER — PREDNISONE 10 MG PO TABS: 30.0000 mg | ORAL_TABLET | Freq: Every day | ORAL | Status: DC

## 2013-08-24 NOTE — ED Notes (Signed)
C/o wheezing which started last night Denies any asthma hx No treatments done.

## 2013-08-24 NOTE — ED Provider Notes (Signed)
Antonio [name] is a 80 y.o. male who presents to Urgent Care today for wheezing at night. Patient notes that his wife notes that he seems to have coarse breath sounds or wheezing present since yesterday evening. He feels well with no shortness of breath  congestion nausea vomiting or diarrhea. He does note a mild cough. He denies any fevers chills. He feels well otherwise. He has not tried any medications for this problem yet.   Past Medical History  Diagnosis Date  . Hypertension   . Cardiomyopathy     secondary to Tachycardia. A-Acute on chronic admit for CHF 6/08. B- CHF coupled with atrial fib RVR electrophysiology study 09/27/07.  A- Multiple flutters and Atrial fib- no ablation performed b- ibutilide cardoversion c- amiordarone load- d/c in sinus rhyrthm d- coumadin anticoagulation- stopped at time of GI bleed 11/09  . Mallory - Weiss tear     esophagitis, 6 unit hemorrhage- EPI injections   . Glaucoma   . Atrial fibrillation   . Appendicitis     SBP 7/09 ruputre w/ RLQ abscess 03/16/08  . Hyperlipidemia   . BPH (benign prostatic hyperplasia) 06/29/2011  . Small bowel obstruction   . Blood transfusion without reported diagnosis    History  Substance Use Topics  . Smoking status: Former Smoker    Quit date: 06/30/1971  . Smokeless tobacco: Never Used     Comment: quit tobacco remotely , ~ 1 ppd  . Alcohol Use: No   ROS as above Medications reviewed. No current facility-administered medications for this encounter.   Current Outpatient Prescriptions  Medication Sig Dispense Refill  . albuterol (PROVENTIL HFA;VENTOLIN HFA) 108 (90 BASE) MCG/ACT inhaler Inhale 2 puffs into the lungs every 6 (six) hours as needed for wheezing or shortness of breath.  1 Inhaler  2  . amiodarone (PACERONE) 200 MG tablet Take 0.5 tablets (100 mg total) by mouth daily.  30 tablet  3  . aspirin 81 MG tablet Take 81 mg by mouth daily.      . bisoprolol (ZEBETA) 5 MG tablet 1/2 tablet bid.  30 tablet  6  .  dorzolamide (TRUSOPT) 2 % ophthalmic solution Place 1 drop into both eyes 2 (two) times daily.        Marland Kitchen losartan (COZAAR) 100 MG tablet Take 1 tablet (100 mg total) by mouth daily.  30 tablet  6  . Multiple Vitamins-Minerals (CENTRUM) tablet Take 1 tablet by mouth daily.        . predniSONE (DELTASONE) 10 MG tablet Take 3 tablets (30 mg total) by mouth daily.  15 tablet  0  . timolol (TIMOPTIC) 0.5 % ophthalmic solution Place 1 drop into both eyes daily.          Exam:  BP 173/113  Pulse 67  Temp(Src) 98.2 F (36.8 C) (Oral)  Resp 17  SpO2 98% Gen: Well NAD HEENT: EOMI,  MMM Lungs: Normal work of breathing. Slight prolonged expiratory phase and coarse breath sounds present left-sided lung. Heart: RRR no MRG Abd: NABS, Soft. NT, ND Exts: Non edematous BL  LE, warm and well perfused.   No results found for this or any previous visit (from the past 24 hour(s)). Dg Chest 2 View  08/24/2013   CLINICAL DATA:  Wheezing for 2 days  EXAM: CHEST  2 VIEW  COMPARISON:  02/04/2009  FINDINGS: Mild to moderate cardiac enlargement. Vascular pattern normal. No consolidation or effusion. Hyperinflation suggesting COPD. Two consecutive thoracic vertebral bodies at thoracolumbar junction  show significant compression deformity. This is unchanged when compared to prior study.  IMPRESSION: COPD with chronic compression deformities.  No acute findings.   Electronically Signed   By: Skipper Cliche M.D.   On: 08/24/2013 15:11    Assessment and Plan: 80 y.o. male with bronchitis versus COPD exacerbation. As far as I can tell patient does not have a diagnosis of COPD. Will use low-dose and short duration prednisone and albuterol. Recommend patient followup with primary care provider. Patient's underlying amiodarone medication may be causing some lung damage. This may be considered when patient follows up with primary care provider. Discussed warning signs or symptoms. Please see discharge instructions. Patient  expresses understanding.      Gregor Hams, MD 08/24/13 1536

## 2013-08-24 NOTE — Discharge Instructions (Signed)
Thank you for coming in today. Take prednisone daily for 5 days. Use albuterol as needed. Followup with your primary care Dr. soon. Call or go to the emergency room if you get worse, have trouble breathing, have chest pains, or palpitations.   Cough, Adult  A cough is a reflex that helps clear your throat and airways. It can help heal the body or may be a reaction to an irritated airway. A cough may only last 2 or 3 weeks (acute) or may last more than 8 weeks (chronic).  CAUSES Acute cough:  Viral or bacterial infections. Chronic cough:  Infections.  Allergies.  Asthma.  Post-nasal drip.  Smoking.  Heartburn or acid reflux.  Some medicines.  Chronic lung problems (COPD).  Cancer. SYMPTOMS   Cough.  Fever.  Chest pain.  Increased breathing rate.  High-pitched whistling sound when breathing (wheezing).  Colored mucus that you cough up (sputum). TREATMENT   A bacterial cough may be treated with antibiotic medicine.  A viral cough must run its course and will not respond to antibiotics.  Your caregiver may recommend other treatments if you have a chronic cough. HOME CARE INSTRUCTIONS   Only take over-the-counter or prescription medicines for pain, discomfort, or fever as directed by your caregiver. Use cough suppressants only as directed by your caregiver.  Use a cold steam vaporizer or humidifier in your bedroom or home to help loosen secretions.  Sleep in a semi-upright position if your cough is worse at night.  Rest as needed.  Stop smoking if you smoke. SEEK IMMEDIATE MEDICAL CARE IF:   You have pus in your sputum.  Your cough starts to worsen.  You cannot control your cough with suppressants and are losing sleep.  You begin coughing up blood.  You have difficulty breathing.  You develop pain which is getting worse or is uncontrolled with medicine.  You have a fever. MAKE SURE YOU:   Understand these instructions.  Will watch your  condition.  Will get help right away if you are not doing well or get worse. Document Released: 02/04/2011 Document Revised: 10/31/2011 Document Reviewed: 02/04/2011 [facility] Patient Information 2014 [place].

## 2013-08-29 ENCOUNTER — Telehealth: Payer: Self-pay

## 2013-08-29 NOTE — Telephone Encounter (Signed)
Patient schedule and notified.

## 2013-08-29 NOTE — Telephone Encounter (Signed)
Patient left voice mail stating that he was seen at the [facility] last week. By the time his visit was over the pharmacy had closed and he did not start taking his medication until Monday. He is not feeling better. He would like additional meds sent in. You have one same day at 3:45 tomorrow, would you like for me to schedule him in?

## 2013-08-29 NOTE — Telephone Encounter (Signed)
Yes

## 2013-08-30 ENCOUNTER — Ambulatory Visit (INDEPENDENT_AMBULATORY_CARE_PROVIDER_SITE_OTHER): Payer: Medicare Other | Admitting: Internal Medicine

## 2013-08-30 ENCOUNTER — Encounter: Payer: Self-pay | Admitting: Internal Medicine

## 2013-08-30 VITALS — BP 152/78 | HR 66 | Temp 98.0°F | Wt 256.0 lb

## 2013-08-30 DIAGNOSIS — J209 Acute bronchitis, unspecified: Secondary | ICD-10-CM | POA: Diagnosis not present

## 2013-08-30 MED ORDER — DOXYCYCLINE HYCLATE 100 MG PO TABS: 100.0000 mg | ORAL_TABLET | Freq: Two times a day (BID) | ORAL | Status: DC

## 2013-08-30 NOTE — Patient Instructions (Signed)
Rest, fluids , tylenol For cough, take Mucinex DM twice a day as needed  Albuterol no more than 3 times a day as needed for wheezing or chest congestion Take the antibiotic as prescribed  (doxy) Call if not back to normal in 2 weeks  Call anytime if the symptoms are severe

## 2013-08-30 NOTE — Progress Notes (Signed)
   Subjective:    Patient ID: Antonio [name], male    DOB: 1928/11/04, 80 y.o.   MRN: [id number]  HPI Acute visit Symptoms started 08-23-2013, went to the urgent care the next day, he was having cough, chest congestion. Chest x-ray showed no acute changes. He was prescribed albuterol and a low dose of prednisone. He is here because he continue with cough, chest congestion and in addition he is coughing of green sputum.  Past Medical History  Diagnosis Date  . Hypertension   . Cardiomyopathy     secondary to Tachycardia. A-Acute on chronic admit for CHF 6/08. B- CHF coupled with atrial fib RVR electrophysiology study 09/27/07.  A- Multiple flutters and Atrial fib- no ablation performed b- ibutilide cardoversion c- amiordarone load- d/c in sinus rhyrthm d- coumadin anticoagulation- stopped at time of GI bleed 11/09  . Mallory - Weiss tear     esophagitis, 6 unit hemorrhage- EPI injections   . Glaucoma   . Atrial fibrillation   . Appendicitis     SBP 7/09 ruputre w/ RLQ abscess 03/16/08  . Hyperlipidemia   . BPH (benign prostatic hyperplasia) 06/29/2011  . Small bowel obstruction   . Blood transfusion without reported diagnosis    Past Surgical History  Procedure Laterality Date  . Hemorrhoid surgery    . Tee/dccv  02/07/07  . Ep study  09/27/07    see pmh  . Appendectomy      exploratory, ruptured appendix, RLQ abscess  . Polypectomy    . Colonoscopy     History   Social History  . Marital Status: Married    Spouse Name: N/A    Number of Children: 3  . Years of Education: N/A   Occupational History  . Cabinetmaker     works part time   Social History Main Topics  . Smoking status: Former Smoker    Quit date: 06/30/1971  . Smokeless tobacco: Never Used     Comment: quit tobacco remotely , ~ 1 ppd  . Alcohol Use: No  . Drug Use: No  . Sexual Activity: Not on file   Other Topics Concern  . Not on file   Social History Narrative   One son lives in [place], goes there  from time to time             Review of Systems Denies chest pain or shortness or breath No fever or chills No  nausea, vomiting, diarrhea or myalgias. No sinus pain or congestion.     Objective:   Physical Exam BP 152/78  Pulse 66  Temp(Src) 98 F (36.7 C)  Wt 256 lb (116.121 kg)  SpO2 94% General -- alert, well-developed, NAD.  HEENT--  Nose not congested.  Lungs -- normal respiratory effort, no intercostal retractions, no accessory muscle use, and normal breath sounds.  Heart-- normal rate, regular rhythm, no murmur.  Neurologic--  alert & oriented X3. Speech normal, gait normal, strength normal in all extremities.  Psych-- Cognition and judgment appear intact. Cooperative with normal attention span and concentration. No anxious or depressed appearing.     Assessment & Plan:  Bronchitis, See history of present illness, symptoms consistent with bronchitis, he is using albuterol regularly approximately 3 times a day. He is not wheezing today. Plan: doxy, Mucinex DM, continue with albuterol for now, if he's not better or if he continue taking albuterol in the next 2 weeks he is to let me know.

## 2013-08-30 NOTE — Progress Notes (Signed)
Pre visit review using our clinic review tool, if applicable. No additional management support is needed unless otherwise documented below in the visit note. 

## 2013-09-01 ENCOUNTER — Encounter: Payer: Self-pay | Admitting: Internal Medicine

## 2013-09-24 DIAGNOSIS — H4011X0 Primary open-angle glaucoma, stage unspecified: Secondary | ICD-10-CM | POA: Diagnosis not present

## 2013-09-24 DIAGNOSIS — H43819 Vitreous degeneration, unspecified eye: Secondary | ICD-10-CM | POA: Diagnosis not present

## 2013-09-24 DIAGNOSIS — H26499 Other secondary cataract, unspecified eye: Secondary | ICD-10-CM | POA: Diagnosis not present

## 2013-09-24 DIAGNOSIS — Z961 Presence of intraocular lens: Secondary | ICD-10-CM | POA: Diagnosis not present

## 2013-10-01 ENCOUNTER — Other Ambulatory Visit: Payer: Self-pay | Admitting: Internal Medicine

## 2013-10-11 DIAGNOSIS — H5231 Anisometropia: Secondary | ICD-10-CM | POA: Diagnosis not present

## 2013-10-11 DIAGNOSIS — H52229 Regular astigmatism, unspecified eye: Secondary | ICD-10-CM | POA: Diagnosis not present

## 2013-10-11 DIAGNOSIS — H4011X0 Primary open-angle glaucoma, stage unspecified: Secondary | ICD-10-CM | POA: Diagnosis not present

## 2013-10-11 DIAGNOSIS — H524 Presbyopia: Secondary | ICD-10-CM | POA: Diagnosis not present

## 2013-10-25 ENCOUNTER — Other Ambulatory Visit: Payer: Self-pay | Admitting: *Deleted

## 2013-10-25 MED ORDER — BISOPROLOL FUMARATE 5 MG PO TABS: ORAL_TABLET | ORAL | Status: DC

## 2013-11-04 ENCOUNTER — Telehealth: Payer: Self-pay | Admitting: Internal Medicine

## 2013-11-04 NOTE — Telephone Encounter (Signed)
Spoke with pharmacist who states that patient has been taking a whole tablet twice daily so he ran out early.  Pharmacist advised that she will call him and review the directions once again.

## 2013-11-04 NOTE — Telephone Encounter (Signed)
Antonio [name] from [facility] called regarding medication bisoprolol (ZEBETA) 5 MG tablet. Patient states that he is to take one tablet a day but the pharmacy have it as 1/2 tablet a day. Please advise the correct way he should take it.

## 2013-12-30 ENCOUNTER — Ambulatory Visit (INDEPENDENT_AMBULATORY_CARE_PROVIDER_SITE_OTHER): Payer: Medicare Other | Admitting: Internal Medicine

## 2013-12-30 ENCOUNTER — Encounter: Payer: Self-pay | Admitting: Internal Medicine

## 2013-12-30 VITALS — BP 156/83 | HR 64 | Temp 97.5°F | Wt 257.6 lb

## 2013-12-30 DIAGNOSIS — I4891 Unspecified atrial fibrillation: Secondary | ICD-10-CM | POA: Diagnosis not present

## 2013-12-30 DIAGNOSIS — R059 Cough, unspecified: Secondary | ICD-10-CM

## 2013-12-30 DIAGNOSIS — R05 Cough: Secondary | ICD-10-CM | POA: Diagnosis not present

## 2013-12-30 DIAGNOSIS — I10 Essential (primary) hypertension: Secondary | ICD-10-CM

## 2013-12-30 LAB — COMPREHENSIVE METABOLIC PANEL
ALT: 27 U/L (ref 0–53)
AST: 24 U/L (ref 0–37)
Albumin: 4.1 g/dL (ref 3.5–5.2)
Alkaline Phosphatase: 65 U/L (ref 39–117)
BUN: 17 mg/dL (ref 6–23)
CO2: 27 meq/L (ref 19–32)
CREATININE: 1.1 mg/dL (ref 0.4–1.5)
Calcium: 9.4 mg/dL (ref 8.4–10.5)
Chloride: 107 mEq/L (ref 96–112)
GFR: 71.79 mL/min (ref >60.00)
Glucose, Bld: 102 mg/dL — ABNORMAL HIGH (ref 70–99)
Potassium: 4.3 mEq/L (ref 3.5–5.1)
Sodium: 140 mEq/L (ref 135–145)
Total Bilirubin: 0.9 mg/dL (ref 0.2–1.2)
Total Protein: 7.1 g/dL (ref 6.0–8.3)

## 2013-12-30 NOTE — Progress Notes (Signed)
Pre visit review using our clinic review tool, if applicable. No additional management support is needed unless otherwise documented below in the visit note. 

## 2013-12-30 NOTE — Assessment & Plan Note (Signed)
BP slightly elevated today but usually within normal when checked at home . plan-- labs, no change

## 2013-12-30 NOTE — Patient Instructions (Signed)
Get your blood work before you leave   Next visit is for a physical exam by November 2015, fasting Please make an appointment

## 2013-12-30 NOTE — Assessment & Plan Note (Signed)
Continue to occasional cough after eating, seems to be related to vasomotor rhinitis by history. Declined  treatment

## 2013-12-30 NOTE — Progress Notes (Signed)
Subjective:    Patient ID: Antonio [name], male    DOB: [birth year], 80 y.o.   MRN: [id number]  DOS:  12/30/2013 Type of  visit: ROV Feeling  well, meds reviwed, good compliance . Reports occasional cough, usually after he eats. During his meals , his nose gets very congested and he starts coughing. Reports no choking.   ROS He remains active doing yardwork, usually do a stationary bike 30 minutes at night Denies chest pain, difficulty breathing, no extremity edema or palpitations. Very rarely has heartburn, no dysphagia or odynophagia. No wheezing.   Past Medical History  Diagnosis Date  . Hypertension   . Cardiomyopathy     secondary to Tachycardia. A-Acute on chronic admit for CHF 6/08. B- CHF coupled with atrial fib RVR electrophysiology study 09/27/07.  A- Multiple flutters and Atrial fib- no ablation performed b- ibutilide cardoversion c- amiordarone load- d/c in sinus rhyrthm d- coumadin anticoagulation- stopped at time of GI bleed 11/09  . Mallory - Weiss tear     esophagitis, 6 unit hemorrhage- EPI injections   . Glaucoma   . Atrial fibrillation   . Appendicitis     SBP 7/09 ruputre w/ RLQ abscess 03/16/08  . Hyperlipidemia   . BPH (benign prostatic hyperplasia) 06/29/2011  . Small bowel obstruction   . Blood transfusion without reported diagnosis     Past Surgical History  Procedure Laterality Date  . Hemorrhoid surgery    . Tee/dccv  02/07/07  . Ep study  09/27/07    see pmh  . Appendectomy      exploratory, ruptured appendix, RLQ abscess  . Polypectomy    . Colonoscopy      History   Social History  . Marital Status: Married    Spouse Name: N/A    Number of Children: 3  . Years of Education: N/A   Occupational History  . Cabinetmaker     works part time   Social History Main Topics  . Smoking status: Former Smoker    Quit date: 06/30/1971  . Smokeless tobacco: Never Used     Comment: quit tobacco remotely , ~ 1 ppd  . Alcohol Use: No  . Drug Use: No   . Sexual Activity: Not on file   Other Topics Concern  . Not on file   Social History Narrative   One son lives in [place], goes there from time to time                  Medication List       This list is accurate as of: 12/30/13  6:58 PM.  Always use your most recent med list.               amiodarone 200 MG tablet  Commonly known as:  PACERONE  Take 0.5 tablets (100 mg total) by mouth daily.     aspirin 81 MG tablet  Take 81 mg by mouth daily.     bisoprolol 5 MG tablet  Commonly known as:  ZEBETA  TAKE 1TABLET BY MOUTH TWICE DAILY     CENTRUM tablet  Take 1 tablet by mouth daily.     dorzolamide 2 % ophthalmic solution  Commonly known as:  TRUSOPT  Place 1 drop into both eyes 2 (two) times daily.     losartan 100 MG tablet  Commonly known as:  COZAAR  Take 1 tablet (100 mg total) by mouth daily.     timolol 0.5 %  ophthalmic solution  Commonly known as:  TIMOPTIC  Place 1 drop into both eyes daily.           Objective:   Physical Exam BP 156/83  Pulse 64  Temp(Src) 97.5 F (36.4 C) (Oral)  Wt 257 lb 9.6 oz (116.847 kg)  SpO2 96% General -- alert, well-developed, NAD.  Lungs -- normal respiratory effort, no intercostal retractions, no accessory muscle use, and normal breath sounds.  Heart-- normal rate, regular rhythm, no murmur.  Extremities-- no pretibial edema bilaterally  Neurologic--  alert & oriented X3. Speech normal, gait normal, strength normal in all extremities.  Psych-- Cognition and judgment appear intact. Cooperative with normal attention span and concentration. No anxious or depressed appearing.     Assessment & Plan:

## 2013-12-30 NOTE — Assessment & Plan Note (Signed)
Seems to be in sinus rhythm, asymptomatic. Plan: Continue amiodarone, arrange a routine visit with cardiology

## 2013-12-31 ENCOUNTER — Other Ambulatory Visit: Payer: Self-pay | Admitting: Internal Medicine

## 2013-12-31 ENCOUNTER — Telehealth: Payer: Self-pay | Admitting: Internal Medicine

## 2013-12-31 NOTE — Telephone Encounter (Signed)
Relevant patient education mailed to patient.  

## 2014-01-02 NOTE — Telephone Encounter (Signed)
Rx's sent to the pharmacy e-script.//AB/CMA

## 2014-01-08 DIAGNOSIS — H4011X0 Primary open-angle glaucoma, stage unspecified: Secondary | ICD-10-CM | POA: Diagnosis not present

## 2014-01-08 DIAGNOSIS — S058X9A Other injuries of unspecified eye and orbit, initial encounter: Secondary | ICD-10-CM | POA: Diagnosis not present

## 2014-01-08 DIAGNOSIS — H409 Unspecified glaucoma: Secondary | ICD-10-CM | POA: Diagnosis not present

## 2014-01-08 DIAGNOSIS — H18419 Arcus senilis, unspecified eye: Secondary | ICD-10-CM | POA: Diagnosis not present

## 2014-01-22 ENCOUNTER — Encounter: Payer: Self-pay | Admitting: Internal Medicine

## 2014-01-22 ENCOUNTER — Ambulatory Visit (INDEPENDENT_AMBULATORY_CARE_PROVIDER_SITE_OTHER): Payer: Medicare Other | Admitting: Internal Medicine

## 2014-01-22 VITALS — BP 186/92 | HR 63 | Ht 74.5 in | Wt 261.1 lb

## 2014-01-22 DIAGNOSIS — I4891 Unspecified atrial fibrillation: Secondary | ICD-10-CM | POA: Diagnosis not present

## 2014-01-22 DIAGNOSIS — I429 Cardiomyopathy, unspecified: Secondary | ICD-10-CM

## 2014-01-22 DIAGNOSIS — I480 Paroxysmal atrial fibrillation: Secondary | ICD-10-CM

## 2014-01-22 MED ORDER — LOSARTAN POTASSIUM-HCTZ 100-25 MG PO TABS: 1.0000 | ORAL_TABLET | Freq: Every day | ORAL | Status: DC

## 2014-01-22 NOTE — Progress Notes (Signed)
Patient Care Team: Colon Branch, MD as PCP - General   HPI  Antonio [name] is a 80 y.o. male seen in followup for paroxysmal atrial fibrillation associated with a tachycardia-induced cardiomyopathy which has now normalized. He takes amiodarone.  We have discussed anticoagulation on  number of occasions. He has repeatedly declined and is still on aspirin.   The patient denies chest pain, shortness of breath, nocturnal dyspnea, orthopnea There have been no palpitations, lightheadedness or syncope.  TSH was normal earlier this year.    His edema improved somewhat with the discontinuation of his amlodipine in the introduction of losartan. He still has some edema area  He also has a cough which is modestly productive at times.   Echocardiogram 12/10 demonstrated normal LV function with moderate biatrial enlargement--LAE (51/2.1) Past Medical History  Diagnosis Date  . Hypertension   . Cardiomyopathy     secondary to Tachycardia. A-Acute on chronic admit for CHF 6/08. B- CHF coupled with atrial fib RVR electrophysiology study 09/27/07.  A- Multiple flutters and Atrial fib- no ablation performed b- ibutilide cardoversion c- amiordarone load- d/c in sinus rhyrthm d- coumadin anticoagulation- stopped at time of GI bleed 11/09  . Mallory - Weiss tear     esophagitis, 6 unit hemorrhage- EPI injections   . Glaucoma   . Atrial fibrillation   . Appendicitis     SBP 7/09 ruputre w/ RLQ abscess 03/16/08  . Hyperlipidemia   . BPH (benign prostatic hyperplasia) 06/29/2011  . Small bowel obstruction   . Blood transfusion without reported diagnosis     Past Surgical History  Procedure Laterality Date  . Hemorrhoid surgery    . Tee/dccv  02/07/07  . Ep study  09/27/07    see pmh  . Appendectomy      exploratory, ruptured appendix, RLQ abscess  . Polypectomy    . Colonoscopy      Current Outpatient Prescriptions  Medication Sig Dispense Refill  . amiodarone (PACERONE) 200 MG tablet TAKE  1/2 TABLET BY MOUTH ONCE DAILY  30 tablet  3  . aspirin 81 MG tablet Take 81 mg by mouth daily.      . bisoprolol (ZEBETA) 5 MG tablet TAKE 1TABLET BY MOUTH TWICE DAILY      . dorzolamide (TRUSOPT) 2 % ophthalmic solution Place 1 drop into both eyes 2 (two) times daily.        Marland Kitchen losartan (COZAAR) 100 MG tablet TAKE 1 TABLET BY MOUTH ONCE DAILY  30 tablet  5  . Multiple Vitamins-Minerals (CENTRUM) tablet Take 1 tablet by mouth daily.        . timolol (TIMOPTIC) 0.5 % ophthalmic solution Place 1 drop into both eyes daily.         No current facility-administered medications for this visit.    No Known Allergies  Review of Systems negative except from HPI and PMH  Physical Exam BP 186/92  Pulse 63  Ht 6' 2.5" (1.892 m)  Wt 261 lb 1.9 oz (118.443 kg)  BMI 33.09 kg/m2 Well developed and well nourished in no acute distress HENT normal E scleral and icterus clear Neck Supple JVP flat; carotids brisk and full Clear to ausculation  Regular rate and rhythm, no murmurs gallops or rub Soft with active bowel sounds No clubbing cyanosis 1+ Edema Alert and oriented, grossly normal motor and sensory function Skin Warm and Dry  ECG demonstrates sinus rhythm at 63 Intervals 20/10/46 Axis left -69  Assessment  and  Plan  Atrial fibrillation  Cough  Amiodarone for #1  Resolved cardiomyopathy  Hypertension  We will continue his amiodarone at low dose we will check a TSH Will recheck his metabolic profile follow with the introduction of hydrochlorothiazide to his blood pressure Regime.  His cough is not likely related to amiodarone toxicity given the very low dose.  His TSH gradually continues to fall.

## 2014-01-22 NOTE — Patient Instructions (Addendum)
Your physician has recommended you make the following change in your medication:  1) STOP Cozaar 2) START Hyzaar 100/25 mg   Your physician recommends that you return for lab work in: 2 weeks for BMET & TSH   Your physician wants you to follow-up in: 6 months with [name].  You will receive a reminder letter in the mail two months in advance. If you don't receive a letter, please call our office to schedule the follow-up appointment.

## 2014-02-10 ENCOUNTER — Other Ambulatory Visit (INDEPENDENT_AMBULATORY_CARE_PROVIDER_SITE_OTHER): Payer: Medicare Other

## 2014-02-10 DIAGNOSIS — I480 Paroxysmal atrial fibrillation: Secondary | ICD-10-CM

## 2014-02-10 DIAGNOSIS — I4891 Unspecified atrial fibrillation: Secondary | ICD-10-CM

## 2014-02-10 LAB — BASIC METABOLIC PANEL
BUN: 20 mg/dL (ref 6–23)
CHLORIDE: 106 meq/L (ref 96–112)
CO2: 29 mEq/L (ref 19–32)
CREATININE: 1.1 mg/dL (ref 0.4–1.5)
Calcium: 9.5 mg/dL (ref 8.4–10.5)
GFR: 68.74 mL/min (ref >60.00)
Glucose, Bld: 106 mg/dL — ABNORMAL HIGH (ref 70–99)
POTASSIUM: 3.6 meq/L (ref 3.5–5.1)
Sodium: 142 mEq/L (ref 135–145)

## 2014-02-10 LAB — TSH: TSH: 2.52 u[IU]/mL (ref 0.35–4.50)

## 2014-02-11 ENCOUNTER — Other Ambulatory Visit: Payer: Self-pay | Admitting: Internal Medicine

## 2014-05-14 DIAGNOSIS — H4011X0 Primary open-angle glaucoma, stage unspecified: Secondary | ICD-10-CM | POA: Diagnosis not present

## 2014-05-14 DIAGNOSIS — H11159 Pinguecula, unspecified eye: Secondary | ICD-10-CM | POA: Diagnosis not present

## 2014-05-14 DIAGNOSIS — Z961 Presence of intraocular lens: Secondary | ICD-10-CM | POA: Diagnosis not present

## 2014-05-14 DIAGNOSIS — H18419 Arcus senilis, unspecified eye: Secondary | ICD-10-CM | POA: Diagnosis not present

## 2014-05-14 DIAGNOSIS — H01009 Unspecified blepharitis unspecified eye, unspecified eyelid: Secondary | ICD-10-CM | POA: Diagnosis not present

## 2014-05-14 DIAGNOSIS — H409 Unspecified glaucoma: Secondary | ICD-10-CM | POA: Diagnosis not present

## 2014-05-14 DIAGNOSIS — Z9849 Cataract extraction status, unspecified eye: Secondary | ICD-10-CM | POA: Diagnosis not present

## 2014-07-03 ENCOUNTER — Encounter: Payer: Self-pay | Admitting: Internal Medicine

## 2014-07-03 ENCOUNTER — Ambulatory Visit (INDEPENDENT_AMBULATORY_CARE_PROVIDER_SITE_OTHER): Payer: Medicare Other | Admitting: Internal Medicine

## 2014-07-03 VITALS — BP 165/89 | HR 66 | Temp 97.6°F | Ht 73.0 in | Wt 262.1 lb

## 2014-07-03 DIAGNOSIS — L989 Disorder of the skin and subcutaneous tissue, unspecified: Secondary | ICD-10-CM | POA: Diagnosis not present

## 2014-07-03 DIAGNOSIS — I480 Paroxysmal atrial fibrillation: Secondary | ICD-10-CM

## 2014-07-03 DIAGNOSIS — R739 Hyperglycemia, unspecified: Secondary | ICD-10-CM | POA: Diagnosis not present

## 2014-07-03 DIAGNOSIS — I10 Essential (primary) hypertension: Secondary | ICD-10-CM | POA: Diagnosis not present

## 2014-07-03 DIAGNOSIS — Z23 Encounter for immunization: Secondary | ICD-10-CM | POA: Diagnosis not present

## 2014-07-03 DIAGNOSIS — Z0000 Encounter for general adult medical examination without abnormal findings: Secondary | ICD-10-CM

## 2014-07-03 DIAGNOSIS — N400 Enlarged prostate without lower urinary tract symptoms: Secondary | ICD-10-CM

## 2014-07-03 DIAGNOSIS — R7989 Other specified abnormal findings of blood chemistry: Secondary | ICD-10-CM

## 2014-07-03 DIAGNOSIS — E049 Nontoxic goiter, unspecified: Secondary | ICD-10-CM

## 2014-07-03 LAB — COMPREHENSIVE METABOLIC PANEL
ALT: 40 U/L (ref 0–53)
AST: 29 U/L (ref 0–37)
Albumin: 3.6 g/dL (ref 3.5–5.2)
Alkaline Phosphatase: 60 U/L (ref 39–117)
BILIRUBIN TOTAL: 0.6 mg/dL (ref 0.2–1.2)
BUN: 19 mg/dL (ref 6–23)
CO2: 21 meq/L (ref 19–32)
Calcium: 9.6 mg/dL (ref 8.4–10.5)
Chloride: 107 mEq/L (ref 96–112)
Creatinine, Ser: 1.1 mg/dL (ref 0.4–1.5)
GFR: 70.16 mL/min (ref >60.00)
GLUCOSE: 104 mg/dL — AB (ref 70–99)
Potassium: 4.2 mEq/L (ref 3.5–5.1)
SODIUM: 140 meq/L (ref 135–145)
TOTAL PROTEIN: 6.9 g/dL (ref 6.0–8.3)

## 2014-07-03 LAB — HEMOGLOBIN A1C: Hgb A1c MFr Bld: 5.6 % (ref 4.6–6.5)

## 2014-07-03 LAB — CBC WITH DIFFERENTIAL/PLATELET
Basophils Absolute: 0 10*3/uL (ref 0.0–0.1)
Basophils Relative: 0.5 % (ref 0.0–3.0)
Eosinophils Absolute: 0.1 10*3/uL (ref 0.0–0.7)
Eosinophils Relative: 2.9 % (ref 0.0–5.0)
HEMATOCRIT: 44.3 % (ref 39.0–52.0)
Hemoglobin: 14.8 g/dL (ref 13.0–17.0)
Lymphocytes Relative: 30.2 % (ref 12.0–46.0)
Lymphs Abs: 1.5 10*3/uL (ref 0.7–4.0)
MCHC: 33.3 g/dL (ref 30.0–36.0)
MCV: 95.9 fl (ref 78.0–100.0)
MONOS PCT: 9.5 % (ref 3.0–12.0)
Monocytes Absolute: 0.5 10*3/uL (ref 0.1–1.0)
NEUTROS PCT: 56.9 % (ref 43.0–77.0)
Neutro Abs: 2.8 10*3/uL (ref 1.4–7.7)
Platelets: 189 10*3/uL (ref 150.0–400.0)
RBC: 4.62 Mil/uL (ref 4.22–5.81)
RDW: 13.3 % (ref 11.5–15.5)
WBC: 5 10*3/uL (ref 4.0–10.5)

## 2014-07-03 LAB — TSH: TSH: 3.12 u[IU]/mL (ref 0.35–4.50)

## 2014-07-03 MED ORDER — BISOPROLOL FUMARATE 5 MG PO TABS: ORAL_TABLET | ORAL | Status: DC

## 2014-07-03 MED ORDER — AMIODARONE HCL 200 MG PO TABS: ORAL_TABLET | ORAL | Status: DC

## 2014-07-03 MED ORDER — LOSARTAN POTASSIUM-HCTZ 100-25 MG PO TABS: 1.0000 | ORAL_TABLET | Freq: Every day | ORAL | Status: DC

## 2014-07-03 NOTE — Progress Notes (Signed)
Subjective:    Patient ID: Antonio [name], male    DOB: [birth year], 80 y.o.   MRN: [id number]  DOS:  07/03/2014 Type of visit - description :   Here for Medicare AWV: 1. Risk factors based on Past M, S, F history: reviewed 2. Physical Activities: active, still works part time Financial planner ), yard work,  3. Depression/mood: neg screening 4. Hearing: slt  decreased per pt, however no problems noted , some tinnitus in the L x years,  not getting worse    5. ADL's: totally independent , still drives   6.  Fall Risk:  no recent falls, counseled, see instructions 7. Home Safety: does feel safe at home 8.Height, weight, &visual acuity: see VS, vision ok, s/p cataract surgery, sees  eye doctor regulalrly 9.  Counseling: yes , see below   10. Labs ordered based on risk factors: yes 11.  Referral Coordination, if needed   12.  Care Plan, see a/p   13. Cognitive Assessment: motor skills appropriate for age, cognition and memory seem appropriate  In addition we discussed the following issues Hypertension, good medication compliance, BP today slightly elevated, ambulatory BPs consistently 063-016 with a diastolic in the 01U. Reports a slightly decrease his stamina, he is able to go to the mailbox and walk 400 feet, is costing him more effort to get there but denies DOE, palpitations, chest pain, difficulty breathing. Cardiovascular, saw cardiology 01/2014, next visit 07/2014; good compliance with medications.    ROS Mild lower extremity edema at baseline. No dysuria, gross hematuria difficulty urinating Denies nausea, vomiting, diarrhea or blood in the stools.   Past Medical History  Diagnosis Date  . Hypertension   . Cardiomyopathy     secondary to Tachycardia. A-Acute on chronic admit for CHF 6/08. B- CHF coupled with atrial fib RVR electrophysiology study 09/27/07.  A- Multiple flutters and Atrial fib- no ablation performed b- ibutilide cardoversion c- amiordarone load- d/c in sinus  rhyrthm d- coumadin anticoagulation- stopped at time of GI bleed 11/09  . Mallory - Weiss tear     esophagitis, 6 unit hemorrhage- EPI injections   . Glaucoma   . Atrial fibrillation   . Appendicitis     SBP 7/09 ruputre w/ RLQ abscess 03/16/08  . Hyperlipidemia   . BPH (benign prostatic hyperplasia) 06/29/2011  . Small bowel obstruction   . Blood transfusion without reported diagnosis     Past Surgical History  Procedure Laterality Date  . Hemorrhoid surgery    . Tee/dccv  02/07/07  . Ep study  09/27/07    see pmh  . Appendectomy      exploratory, ruptured appendix, RLQ abscess  . Polypectomy    . Colonoscopy      History   Social History  . Marital Status: Married    Spouse Name: N/A    Number of Children: 3  . Years of Education: N/A   Occupational History  . Cabinetmaker     works part time   Social History Main Topics  . Smoking status: Former Smoker    Quit date: 06/30/1971  . Smokeless tobacco: Never Used     Comment: quit tobacco remotely , ~ 1 ppd  . Alcohol Use: No  . Drug Use: No  . Sexual Activity: Not on file   Other Topics Concern  . Not on file   Social History Narrative   One son lives in [place], goes there from time to time  Family History  Problem Relation Age of Onset  . Uterine cancer Mother   . Colon cancer Neg Hx   . Prostate cancer Neg Hx   . Diabetes Neg Hx   . Heart attack Maternal Uncle   . Heart disease Maternal Uncle        Medication List       This list is accurate as of: 07/03/14  9:12 AM.  Always use your most recent med list.               amiodarone 200 MG tablet  Commonly known as:  PACERONE  TAKE 1/2 TABLET BY MOUTH ONCE DAILY     aspirin 81 MG tablet  Take 81 mg by mouth daily.     bisoprolol 5 MG tablet  Commonly known as:  ZEBETA  TAKE 1/2 TABLET BY MOUTH TWICE DAILY     CENTRUM tablet  Take 1 tablet by mouth daily.     dorzolamide 2 % ophthalmic solution  Commonly known as:   TRUSOPT  Place 1 drop into both eyes 2 (two) times daily.     losartan-hydrochlorothiazide 100-25 MG per tablet  Commonly known as:  HYZAAR  Take 1 tablet by mouth daily.     timolol 0.5 % ophthalmic solution  Commonly known as:  TIMOPTIC  Place 1 drop into both eyes daily.           Objective:   Physical Exam BP 165/89 mmHg  Pulse 66  Temp(Src) 97.6 F (36.4 C) (Oral)  Ht 6\' 1"  (1.854 m)  Wt 262 lb 2 oz (118.899 kg)  BMI 34.59 kg/m2  SpO2 95% General -- alert, well-developed, NAD.  Neck --palpable, symmetric, slightly enlarged but not tender or nodular  thyroid gland. HEENT-- Not pale.  Lungs -- normal respiratory effort, no intercostal retractions, no accessory muscle use, and normal breath sounds.  Heart-- seems regular,  no murmur.  Abdomen-- Not distended, good bowel sounds,soft, non-tender.  Extremities-- trace  pretibial edema bilaterally  Neurologic--  alert & oriented X3. Speech normal, gait appropriate for age, strength symmetric and appropriate for age.  Psych-- Cognition and judgment appear intact. Cooperative with normal attention span and concentration. No anxious or depressed appearing.        Assessment & Plan:

## 2014-07-03 NOTE — Assessment & Plan Note (Signed)
BP slightly elevated, normal ambulatory BPs, continue with losartan HCT and bisoprolol half tablet in the morning and 1 in the afternoon. Labs

## 2014-07-03 NOTE — Patient Instructions (Signed)
Get your blood work before you leave    Please come back to the office in 6 months  for a routine check up        Preventive Care for Adults    Ages 68 and over  Blood pressure check.** / Every 1 to 2 years.  Lipid and cholesterol check.**/ Every 5 years beginning at age 78.  Lung cancer screening. / Every year if you are aged 57-80 years and have a 30-pack-year history of smoking and currently smoke or have quit within the past 15 years. Yearly screening is stopped once you have quit smoking for at least 15 years or develop a health problem that would prevent you from having lung cancer treatment.  Fecal occult blood test (FOBT) of stool. / Every year beginning at age 42 and continuing until age 78. You may not have to do this test if you get a colonoscopy every 10 years.  Flexible sigmoidoscopy** or colonoscopy.** / Every 5 years for a flexible sigmoidoscopy or every 10 years for a colonoscopy beginning at age 71 and continuing until age 74.  Hepatitis C blood test.** / For all people born from [birth year] through 1963 and any individual with known risks for hepatitis C.  Abdominal aortic aneurysm (AAA) screening.** / A one-time screening for ages 35 to 20 years who are current or former smokers.  Skin self-exam. / Monthly.  Influenza vaccine. / Every year.  Tetanus, diphtheria, and acellular pertussis (Tdap/Td) vaccine.** / 1 dose of Td every 10 years.  Varicella vaccine.** / Consult your health care provider.  Zoster vaccine.** / 1 dose for adults aged 45 years or older.  Pneumococcal 13-valent conjugate (PCV13) vaccine.** / Consult your health care provider.  Pneumococcal polysaccharide (PPSV23) vaccine.** / 1 dose for all adults aged 72 years and older.  Meningococcal vaccine.** / Consult your health care provider.  Hepatitis A vaccine.** / Consult your health care provider.  Hepatitis B vaccine.** / Consult your health care provider.  Haemophilus influenzae type b (Hib)  vaccine.** / Consult your health care provider. **Family history and personal history of risk and conditions may change your health care provider's recommendations. Document Released: 10/04/2001 Document Revised: 08/13/2013 Document Reviewed: 01/03/2011 [facility] Patient Information 2015 [place], [place]. This information is not intended to replace advice given to you by your health care provider. Make sure you discuss any questions you have with your health care provider.      Fall Prevention and Home Safety Falls cause injuries and can affect all age groups. It is possible to use preventive measures to significantly decrease the likelihood of falls. There are many simple measures which can make your home safer and prevent falls. OUTDOORS  Repair cracks and edges of walkways and driveways.  Remove high doorway thresholds.  Trim shrubbery on the main path into your home.  Have good outside lighting.  Clear walkways of tools, rocks, debris, and clutter.  Check that handrails are not broken and are securely fastened. Both sides of steps should have handrails.  Have leaves, snow, and ice cleared regularly.  Use sand or salt on walkways during winter months.  In the garage, clean up grease or oil spills. BATHROOM  Install night lights.  Install grab bars by the toilet and in the tub and shower.  Use non-skid mats or decals in the tub or shower.  Place a plastic non-slip stool in the shower to sit on, if needed.  Keep floors dry and clean up all water on the  floor immediately.  Remove soap buildup in the tub or shower on a regular basis.  Secure bath mats with non-slip, double-sided rug tape.  Remove throw rugs and tripping hazards from the floors. BEDROOMS  Install night lights.  Make sure a bedside light is easy to reach.  Do not use oversized bedding.  Keep a telephone by your bedside.  Have a firm chair with side arms to use for getting dressed.  Remove throw  rugs and tripping hazards from the floor. KITCHEN  Keep handles on pots and pans turned toward the center of the stove. Use back burners when possible.  Clean up spills quickly and allow time for drying.  Avoid walking on wet floors.  Avoid hot utensils and knives.  Position shelves so they are not too high or low.  Place commonly used objects within easy reach.  If necessary, use a sturdy step stool with a grab bar when reaching.  Keep electrical cables out of the way.  Do not use floor polish or wax that makes floors slippery. If you must use wax, use non-skid floor wax.  Remove throw rugs and tripping hazards from the floor. STAIRWAYS  Never leave objects on stairs.  Place handrails on both sides of stairways and use them. Fix any loose handrails. Make sure handrails on both sides of the stairways are as long as the stairs.  Check carpeting to make sure it is firmly attached along stairs. Make repairs to worn or loose carpet promptly.  Avoid placing throw rugs at the top or bottom of stairways, or properly secure the rug with carpet tape to prevent slippage. Get rid of throw rugs, if possible.  Have an electrician put in a light switch at the top and bottom of the stairs. OTHER FALL PREVENTION TIPS  Wear low-heel or rubber-soled shoes that are supportive and fit well. Wear closed toe shoes.  When using a stepladder, make sure it is fully opened and both spreaders are firmly locked. Do not climb a closed stepladder.  Add color or contrast paint or tape to grab bars and handrails in your home. Place contrasting color strips on first and last steps.  Learn and use mobility aids as needed. Install an electrical emergency response system.  Turn on lights to avoid dark areas. Replace light bulbs that burn out immediately. Get light switches that glow.  Arrange furniture to create clear pathways. Keep furniture in the same place.  Firmly attach carpet with non-skid or  double-sided tape.  Eliminate uneven floor surfaces.  Select a carpet pattern that does not visually hide the edge of steps.  Be aware of all pets. OTHER HOME SAFETY TIPS  Set the water temperature for 120 F (48.8 C).  Keep emergency numbers on or near the telephone.  Keep smoke detectors on every level of the home and near sleeping areas. Document Released: 07/29/2002 Document Revised: 02/07/2012 Document Reviewed: 10/28/2011 [facility] Patient Information 2015 [place], [place]. This information is not intended to replace advice given to you by your health care provider. Make sure you discuss any questions you have with your health care provider.

## 2014-07-03 NOTE — Assessment & Plan Note (Signed)
Asymptomatic, last DRE 2013 and it was okay. PSA has been consistently stable. Pros  and cons of continuing prostate cancer screening discussed with the patient, will reassess next year

## 2014-07-03 NOTE — Assessment & Plan Note (Signed)
History of abnormal TSH. On today's exam I felt his thyroid gland to be enlarged. Plan: TSH, thyroid ultrasound

## 2014-07-03 NOTE — Assessment & Plan Note (Signed)
Skin lesion at the right side of the nose, suspect BCC, refer to dermatology

## 2014-07-03 NOTE — Progress Notes (Signed)
Pre visit review using our clinic review tool, if applicable. No additional management support is needed unless otherwise documented below in the visit note. 

## 2014-07-03 NOTE — Assessment & Plan Note (Addendum)
Td 2011 Flu shot -- today pneumonia shot 2009  prevnar-- today shingles immunization-- never got it, see previous entry, declining  09-2011 colonoscopy , Cscope again 11/2012--per Dr Ardis Hughs; adenomatous polyp--due to age no recall (pt and I agree)  diet and exercise discussed

## 2014-07-05 ENCOUNTER — Encounter: Payer: Self-pay | Admitting: Internal Medicine

## 2014-07-08 ENCOUNTER — Ambulatory Visit (HOSPITAL_BASED_OUTPATIENT_CLINIC_OR_DEPARTMENT_OTHER): Payer: Medicare Other

## 2014-07-22 ENCOUNTER — Ambulatory Visit (HOSPITAL_BASED_OUTPATIENT_CLINIC_OR_DEPARTMENT_OTHER)
Admission: RE | Admit: 2014-07-22 | Discharge: 2014-07-22 | Disposition: A | Discharge status: Home / Self Care | Payer: Medicare Other | Source: Ambulatory Visit | Attending: Internal Medicine | Admitting: Internal Medicine

## 2014-07-22 DIAGNOSIS — E042 Nontoxic multinodular goiter: Secondary | ICD-10-CM | POA: Diagnosis not present

## 2014-07-22 DIAGNOSIS — E049 Nontoxic goiter, unspecified: Secondary | ICD-10-CM

## 2014-07-24 ENCOUNTER — Ambulatory Visit (INDEPENDENT_AMBULATORY_CARE_PROVIDER_SITE_OTHER): Payer: Medicare Other | Admitting: Internal Medicine

## 2014-07-24 ENCOUNTER — Encounter: Payer: Self-pay | Admitting: Internal Medicine

## 2014-07-24 VITALS — BP 122/78 | HR 66 | Ht 73.0 in | Wt 261.0 lb

## 2014-07-24 DIAGNOSIS — I480 Paroxysmal atrial fibrillation: Secondary | ICD-10-CM | POA: Diagnosis not present

## 2014-07-24 NOTE — Progress Notes (Signed)
Patient Care Team: Colon Branch, MD as PCP - General   HPI  Antonio [name] is a 80 y.o. male seen in followup for paroxysmal atrial fibrillation associated with a tachycardia-induced cardiomyopathy which has now normalized. He takes amiodarone.  We have discussed anticoagulation on  number of occasions. He has repeatedly declined and is still on aspirin.   The patient denies chest pain, shortness of breath, nocturnal dyspnea, orthopnea There have been no palpitations, lightheadedness or syncope.  TSH was normal earlier this year.    His edema improved somewhat with the discontinuation of his amlodipine in the introduction of losartan. He still has some edema   He also has a cough which is modestly productive at times.   Echocardiogram 12/10 demonstrated normal LV function with moderate biatrial enlargement--LAE (51/2.1) Past Medical History  Diagnosis Date  . Hypertension   . Cardiomyopathy     secondary to Tachycardia. A-Acute on chronic admit for CHF 6/08. B- CHF coupled with atrial fib RVR electrophysiology study 09/27/07.  A- Multiple flutters and Atrial fib- no ablation performed b- ibutilide cardoversion c- amiordarone load- d/c in sinus rhyrthm d- coumadin anticoagulation- stopped at time of GI bleed 11/09  . Mallory - Weiss tear     esophagitis, 6 unit hemorrhage- EPI injections   . Glaucoma   . Atrial fibrillation   . Appendicitis     SBP 7/09 ruputre w/ RLQ abscess 03/16/08  . Hyperlipidemia   . BPH (benign prostatic hyperplasia) 06/29/2011  . Small bowel obstruction   . Blood transfusion without reported diagnosis     Past Surgical History  Procedure Laterality Date  . Hemorrhoid surgery    . Tee/dccv  02/07/07  . Ep study  09/27/07    see pmh  . Appendectomy      exploratory, ruptured appendix, RLQ abscess  . Polypectomy    . Colonoscopy      Current Outpatient Prescriptions  Medication Sig Dispense Refill  . amiodarone (PACERONE) 200 MG tablet TAKE 1/2  TABLET BY MOUTH ONCE DAILY 45 tablet 2  . aspirin 81 MG tablet Take 81 mg by mouth daily.    . bisoprolol (ZEBETA) 5 MG tablet Take 1/2 tablet  in the morning, 1 tablet at night. 90 tablet 2  . dorzolamide (TRUSOPT) 2 % ophthalmic solution Place 1 drop into both eyes 2 (two) times daily.      Marland Kitchen losartan-hydrochlorothiazide (HYZAAR) 100-25 MG per tablet Take 1 tablet by mouth daily. 90 tablet 2  . Multiple Vitamins-Minerals (CENTRUM) tablet Take 1 tablet by mouth daily.      . timolol (TIMOPTIC) 0.5 % ophthalmic solution Place 1 drop into both eyes daily.       No current facility-administered medications for this visit.    No Known Allergies  Review of Systems negative except from HPI and PMH  Physical Exam BP 122/78 mmHg  Pulse 66  Ht 6\' 1"  (1.854 m)  Wt 261 lb (118.389 kg)  BMI 34.44 kg/m2 Well developed and well nourished in no acute distress HENT normal E scleral and icterus clear Neck Supple JVP flat; carotids brisk and full Clear to ausculation  Regular rate and rhythm, no murmurs gallops or rub Soft with active bowel sounds No clubbing cyanosis 1+ Edema Alert and oriented, grossly normal motor and sensory function Skin Warm and Dry  ECG demonstrates sinus rhythm at 63 Intervals 20/10/46 Axis left -69  Assessment and  Plan  Atrial fibrillation-quiescent  Amiodarone for #  1  Resolved cardiomyopathy  Hypertension  We will continue his amiodarone at low dose we will check a TSH recent labs 10/15 were normal

## 2014-07-24 NOTE — Patient Instructions (Signed)
Your physician recommends that you continue on your current medications as directed. Please refer to the Current Medication list given to you today.  Your physician wants you to follow-up in: 6 months with Dr. Klein. You will receive a reminder letter in the mail two months in advance. If you don't receive a letter, please call our office to schedule the follow-up appointment.  

## 2014-07-31 NOTE — Addendum Note (Signed)
Addended by: Marlis Edelson C on: 07/31/2014 11:26 AM   Modules accepted: Orders

## 2014-08-22 DIAGNOSIS — C44311 Basal cell carcinoma of skin of nose: Secondary | ICD-10-CM

## 2014-08-22 DIAGNOSIS — IMO0002 Reserved for concepts with insufficient information to code with codable children: Secondary | ICD-10-CM

## 2014-08-22 HISTORY — DX: Reserved for concepts with insufficient information to code with codable children: IMO0002

## 2014-08-22 HISTORY — DX: Basal cell carcinoma of skin of nose: C44.311

## 2014-09-03 ENCOUNTER — Telehealth: Payer: Self-pay | Admitting: Internal Medicine

## 2014-09-03 ENCOUNTER — Telehealth: Payer: Self-pay

## 2014-09-03 ENCOUNTER — Ambulatory Visit (HOSPITAL_BASED_OUTPATIENT_CLINIC_OR_DEPARTMENT_OTHER)
Admission: RE | Admit: 2014-09-03 | Discharge: 2014-09-03 | Disposition: A | Discharge status: Home / Self Care | Payer: Medicare Other | Source: Ambulatory Visit | Attending: Internal Medicine | Admitting: Internal Medicine

## 2014-09-03 ENCOUNTER — Ambulatory Visit (INDEPENDENT_AMBULATORY_CARE_PROVIDER_SITE_OTHER): Payer: Medicare Other | Admitting: Internal Medicine

## 2014-09-03 ENCOUNTER — Encounter: Payer: Self-pay | Admitting: Internal Medicine

## 2014-09-03 VITALS — BP 124/72 | HR 67 | Temp 97.9°F | Wt 260.0 lb

## 2014-09-03 DIAGNOSIS — S8991XA Unspecified injury of right lower leg, initial encounter: Secondary | ICD-10-CM | POA: Diagnosis not present

## 2014-09-03 DIAGNOSIS — M25061 Hemarthrosis, right knee: Secondary | ICD-10-CM | POA: Diagnosis not present

## 2014-09-03 DIAGNOSIS — S82891A Other fracture of right lower leg, initial encounter for closed fracture: Secondary | ICD-10-CM

## 2014-09-03 DIAGNOSIS — S82141A Displaced bicondylar fracture of right tibia, initial encounter for closed fracture: Secondary | ICD-10-CM | POA: Diagnosis not present

## 2014-09-03 DIAGNOSIS — W19XXXA Unspecified fall, initial encounter: Secondary | ICD-10-CM | POA: Insufficient documentation

## 2014-09-03 DIAGNOSIS — M25561 Pain in right knee: Secondary | ICD-10-CM | POA: Diagnosis present

## 2014-09-03 DIAGNOSIS — S82191A Other fracture of upper end of right tibia, initial encounter for closed fracture: Secondary | ICD-10-CM | POA: Diagnosis not present

## 2014-09-03 NOTE — Telephone Encounter (Signed)
error:[id number] ° °

## 2014-09-03 NOTE — Patient Instructions (Signed)
Stop by the first floor and get the XR    Continue using ice twice a day Tylenol  500 mg OTC 2 tabs a day every 8 hours as needed for pain Try to avoid ibuprofen  We are referring you to a orthopedic doctor

## 2014-09-03 NOTE — Telephone Encounter (Signed)
Closed in error.

## 2014-09-03 NOTE — Telephone Encounter (Signed)
[facility] called to report the following results of Complete Knee:  Fracture of the tibial plateau with extension into the joint space. Associated prominent knee joint effusion.   Please advise.

## 2014-09-03 NOTE — Progress Notes (Signed)
Subjective:    Patient ID: Antonio [name], male    DOB: March 17, 1929, 81 y.o.   MRN: [id number]  DOS:  09/03/2014 Type of visit - description : acute Interval history: Was walking in his basement 4 days ago, he tripped and fell, landed on his right knee, having pain and swelling since then. Using ice and ibuprofen with very little relief.   ROS Denies syncope, head or neck injury, did not bleed anywhere.  Past Medical History  Diagnosis Date  . Hypertension   . Cardiomyopathy     secondary to Tachycardia. A-Acute on chronic admit for CHF 6/08. B- CHF coupled with atrial fib RVR electrophysiology study 09/27/07.  A- Multiple flutters and Atrial fib- no ablation performed b- ibutilide cardoversion c- amiordarone load- d/c in sinus rhyrthm d- coumadin anticoagulation- stopped at time of GI bleed 11/09  . Mallory - Weiss tear     esophagitis, 6 unit hemorrhage- EPI injections   . Glaucoma   . Atrial fibrillation   . Appendicitis     SBP 7/09 ruputre w/ RLQ abscess 03/16/08  . Hyperlipidemia   . BPH (benign prostatic hyperplasia) 06/29/2011  . Small bowel obstruction   . Blood transfusion without reported diagnosis     Past Surgical History  Procedure Laterality Date  . Hemorrhoid surgery    . Tee/dccv  02/07/07  . Ep study  09/27/07    see pmh  . Appendectomy      exploratory, ruptured appendix, RLQ abscess  . Polypectomy    . Colonoscopy      History   Social History  . Marital Status: Married    Spouse Name: N/A    Number of Children: 3  . Years of Education: N/A   Occupational History  . Cabinetmaker     works part time   Social History Main Topics  . Smoking status: Former Smoker    Quit date: 06/30/1971  . Smokeless tobacco: Never Used     Comment: quit tobacco remotely , ~ 1 ppd  . Alcohol Use: No  . Drug Use: No  . Sexual Activity: Not on file   Other Topics Concern  . Not on file   Social History Narrative   Household-- pt and wife (wife developing  dementia)   One son lives in [place], goes there from time to time                  Medication List       This list is accurate as of: 09/03/14 11:59 PM.  Always use your most recent med list.               amiodarone 200 MG tablet  Commonly known as:  PACERONE  TAKE 1/2 TABLET BY MOUTH ONCE DAILY     aspirin 81 MG tablet  Take 81 mg by mouth daily.     bisoprolol 5 MG tablet  Commonly known as:  ZEBETA  Take 1/2 tablet  in the morning, 1 tablet at night.     CENTRUM tablet  Take 1 tablet by mouth daily.     dorzolamide 2 % ophthalmic solution  Commonly known as:  TRUSOPT  Place 1 drop into both eyes 2 (two) times daily.     losartan-hydrochlorothiazide 100-25 MG per tablet  Commonly known as:  HYZAAR  Take 1 tablet by mouth daily.     timolol 0.5 % ophthalmic solution  Commonly known as:  TIMOPTIC  Place 1 drop  into both eyes daily.           Objective:   Physical Exam BP 124/72 mmHg  Pulse 67  Temp(Src) 97.9 F (36.6 C) (Oral)  Wt 260 lb (117.935 kg)  SpO2 94% General -- alert, well-developed, NAD.  Extremities--  Left knee with changes consistent with DJD Right knee: + knee effusion, slightly warm, no red, range of motion decreased, unable to fully flex the knee. Right calf slightly swollen but not tender to palpation. Walks with difficulty, using a walker  Neurologic--  alert & oriented X3. Speech normal. Psych-- Cognition and judgment appear intact. Cooperative with normal attention span and concentration. No anxious or depressed appearing.        Assessment & Plan:   Knee contusion, Suspect internal derrangement, will get x-ray, refer to Ortho, ice. Recommend Tylenol instead of ibuprofen to prevent GI side effects. Addendum, x-ray show any fracture, was refer stat to ortho

## 2014-09-03 NOTE — Telephone Encounter (Signed)
Heard from Radiology, Pt has    Fracture of the tibial plateau with extension into the joint space. Associated prominent knee joint effusion.   His referral to ortho needs to be changed to STAT.  I'm about to call Pt and inform him of x-ray results.

## 2014-09-03 NOTE — Telephone Encounter (Signed)
LMOM informing Pt to return call regarding x-ray results.

## 2014-09-03 NOTE — Progress Notes (Signed)
Pre visit review using our clinic review tool, if applicable. No additional management support is needed unless otherwise documented below in the visit note. 

## 2014-09-04 NOTE — Telephone Encounter (Signed)
Pt saw Dr. Drema Dallas at [facility] regarding patellar fracture yesterday (09/03/2014).

## 2014-09-19 DIAGNOSIS — S82191D Other fracture of upper end of right tibia, subsequent encounter for closed fracture with routine healing: Secondary | ICD-10-CM | POA: Diagnosis not present

## 2014-09-19 DIAGNOSIS — M25461 Effusion, right knee: Secondary | ICD-10-CM | POA: Diagnosis not present

## 2014-10-13 DIAGNOSIS — M25461 Effusion, right knee: Secondary | ICD-10-CM | POA: Diagnosis not present

## 2014-10-13 DIAGNOSIS — S82191D Other fracture of upper end of right tibia, subsequent encounter for closed fracture with routine healing: Secondary | ICD-10-CM | POA: Diagnosis not present

## 2014-10-13 DIAGNOSIS — S8991XD Unspecified injury of right lower leg, subsequent encounter: Secondary | ICD-10-CM | POA: Diagnosis not present

## 2014-10-28 DIAGNOSIS — R262 Difficulty in walking, not elsewhere classified: Secondary | ICD-10-CM | POA: Diagnosis not present

## 2014-10-30 DIAGNOSIS — R262 Difficulty in walking, not elsewhere classified: Secondary | ICD-10-CM | POA: Diagnosis not present

## 2014-11-05 DIAGNOSIS — M6281 Muscle weakness (generalized): Secondary | ICD-10-CM | POA: Diagnosis not present

## 2014-11-05 DIAGNOSIS — S82191D Other fracture of upper end of right tibia, subsequent encounter for closed fracture with routine healing: Secondary | ICD-10-CM | POA: Diagnosis not present

## 2014-11-05 DIAGNOSIS — M25661 Stiffness of right knee, not elsewhere classified: Secondary | ICD-10-CM | POA: Diagnosis not present

## 2014-11-07 DIAGNOSIS — R262 Difficulty in walking, not elsewhere classified: Secondary | ICD-10-CM | POA: Diagnosis not present

## 2014-11-10 DIAGNOSIS — R262 Difficulty in walking, not elsewhere classified: Secondary | ICD-10-CM | POA: Diagnosis not present

## 2014-11-12 DIAGNOSIS — R262 Difficulty in walking, not elsewhere classified: Secondary | ICD-10-CM | POA: Diagnosis not present

## 2014-11-18 DIAGNOSIS — R262 Difficulty in walking, not elsewhere classified: Secondary | ICD-10-CM | POA: Diagnosis not present

## 2014-11-20 DIAGNOSIS — R262 Difficulty in walking, not elsewhere classified: Secondary | ICD-10-CM | POA: Diagnosis not present

## 2014-11-24 DIAGNOSIS — M25661 Stiffness of right knee, not elsewhere classified: Secondary | ICD-10-CM | POA: Diagnosis not present

## 2014-11-24 DIAGNOSIS — R262 Difficulty in walking, not elsewhere classified: Secondary | ICD-10-CM | POA: Diagnosis not present

## 2014-11-24 DIAGNOSIS — M25461 Effusion, right knee: Secondary | ICD-10-CM | POA: Diagnosis not present

## 2014-11-26 DIAGNOSIS — R262 Difficulty in walking, not elsewhere classified: Secondary | ICD-10-CM | POA: Diagnosis not present

## 2014-11-26 DIAGNOSIS — M25661 Stiffness of right knee, not elsewhere classified: Secondary | ICD-10-CM | POA: Diagnosis not present

## 2014-11-26 DIAGNOSIS — M25461 Effusion, right knee: Secondary | ICD-10-CM | POA: Diagnosis not present

## 2014-12-01 DIAGNOSIS — M25661 Stiffness of right knee, not elsewhere classified: Secondary | ICD-10-CM | POA: Diagnosis not present

## 2014-12-01 DIAGNOSIS — M6281 Muscle weakness (generalized): Secondary | ICD-10-CM | POA: Diagnosis not present

## 2014-12-01 DIAGNOSIS — S82191D Other fracture of upper end of right tibia, subsequent encounter for closed fracture with routine healing: Secondary | ICD-10-CM | POA: Diagnosis not present

## 2014-12-02 DIAGNOSIS — M25661 Stiffness of right knee, not elsewhere classified: Secondary | ICD-10-CM | POA: Diagnosis not present

## 2014-12-02 DIAGNOSIS — R262 Difficulty in walking, not elsewhere classified: Secondary | ICD-10-CM | POA: Diagnosis not present

## 2014-12-02 DIAGNOSIS — M25461 Effusion, right knee: Secondary | ICD-10-CM | POA: Diagnosis not present

## 2014-12-04 DIAGNOSIS — M25661 Stiffness of right knee, not elsewhere classified: Secondary | ICD-10-CM | POA: Diagnosis not present

## 2014-12-04 DIAGNOSIS — R262 Difficulty in walking, not elsewhere classified: Secondary | ICD-10-CM | POA: Diagnosis not present

## 2014-12-04 DIAGNOSIS — M25461 Effusion, right knee: Secondary | ICD-10-CM | POA: Diagnosis not present

## 2014-12-09 DIAGNOSIS — M25661 Stiffness of right knee, not elsewhere classified: Secondary | ICD-10-CM | POA: Diagnosis not present

## 2014-12-09 DIAGNOSIS — M25461 Effusion, right knee: Secondary | ICD-10-CM | POA: Diagnosis not present

## 2014-12-09 DIAGNOSIS — R262 Difficulty in walking, not elsewhere classified: Secondary | ICD-10-CM | POA: Diagnosis not present

## 2014-12-29 DIAGNOSIS — S82191D Other fracture of upper end of right tibia, subsequent encounter for closed fracture with routine healing: Secondary | ICD-10-CM | POA: Diagnosis not present

## 2014-12-29 DIAGNOSIS — M6281 Muscle weakness (generalized): Secondary | ICD-10-CM | POA: Diagnosis not present

## 2014-12-29 DIAGNOSIS — M25661 Stiffness of right knee, not elsewhere classified: Secondary | ICD-10-CM | POA: Diagnosis not present

## 2015-01-01 ENCOUNTER — Encounter: Payer: Self-pay | Admitting: Internal Medicine

## 2015-01-01 ENCOUNTER — Ambulatory Visit (INDEPENDENT_AMBULATORY_CARE_PROVIDER_SITE_OTHER): Payer: Medicare Other | Admitting: Internal Medicine

## 2015-01-01 VITALS — BP 130/74 | HR 73 | Temp 97.5°F | Ht 73.0 in | Wt 260.5 lb

## 2015-01-01 DIAGNOSIS — R600 Localized edema: Secondary | ICD-10-CM

## 2015-01-01 DIAGNOSIS — L989 Disorder of the skin and subcutaneous tissue, unspecified: Secondary | ICD-10-CM

## 2015-01-01 DIAGNOSIS — I10 Essential (primary) hypertension: Secondary | ICD-10-CM

## 2015-01-01 LAB — BASIC METABOLIC PANEL
BUN: 20 mg/dL (ref 6–23)
CO2: 29 mEq/L (ref 19–32)
CREATININE: 1.08 mg/dL (ref 0.40–1.50)
Calcium: 9.8 mg/dL (ref 8.4–10.5)
Chloride: 105 mEq/L (ref 96–112)
GFR: 69.33 mL/min (ref >60.00)
Glucose, Bld: 84 mg/dL (ref 70–99)
Potassium: 3.9 mEq/L (ref 3.5–5.1)
Sodium: 138 mEq/L (ref 135–145)

## 2015-01-01 MED ORDER — AMIODARONE HCL 200 MG PO TABS: 100.0000 mg | ORAL_TABLET | Freq: Every day | ORAL | Status: DC

## 2015-01-01 MED ORDER — LOSARTAN POTASSIUM-HCTZ 100-25 MG PO TABS: 1.0000 | ORAL_TABLET | Freq: Every day | ORAL | Status: DC

## 2015-01-01 MED ORDER — BISOPROLOL FUMARATE 5 MG PO TABS: ORAL_TABLET | ORAL | Status: DC

## 2015-01-01 NOTE — Progress Notes (Signed)
Subjective:    Patient ID: Antonio [name], male    DOB: December 12, 1928, 81 y.o.   MRN: [id number]  DOS:  01/01/2015 Type of visit - description : rov Interval history: Hypertension, good compliance with medication, needs a refill. Skin lesion, nose: Did not get to see a dermatologist, request another referral. Has noted some dark lesions at the back. Right knee Fracture, recently saw orthopedic, he was released, status post physical therapy. Pain is essentially gone, still the leg is   slightly weak.     Review of Systems  Denies chest pain or difficulty breathing. No palpitations. No blood in the stools or in the urine. No nausea or vomiting.  Past Medical History  Diagnosis Date  . Hypertension   . Cardiomyopathy     secondary to Tachycardia. A-Acute on chronic admit for CHF 6/08. B- CHF coupled with atrial fib RVR electrophysiology study 09/27/07.  A- Multiple flutters and Atrial fib- no ablation performed b- ibutilide cardoversion c- amiordarone load- d/c in sinus rhyrthm d- coumadin anticoagulation- stopped at time of GI bleed 11/09  . Mallory - Weiss tear     esophagitis, 6 unit hemorrhage- EPI injections   . Glaucoma   . Atrial fibrillation   . Appendicitis     SBP 7/09 ruputre w/ RLQ abscess 03/16/08  . Hyperlipidemia   . BPH (benign prostatic hyperplasia) 06/29/2011  . Small bowel obstruction   . Blood transfusion without reported diagnosis   . Knee fracture, right 08/2014    Past Surgical History  Procedure Laterality Date  . Hemorrhoid surgery    . Tee/dccv  02/07/07  . Ep study  09/27/07    see pmh  . Appendectomy      exploratory, ruptured appendix, RLQ abscess  . Polypectomy    . Colonoscopy      History   Social History  . Marital Status: Married    Spouse Name: N/A  . Number of Children: 3  . Years of Education: N/A   Occupational History  . Cabinetmaker     works part time   Social History Main Topics  . Smoking status: Former Smoker    Quit date:  06/30/1971  . Smokeless tobacco: Never Used     Comment: quit tobacco remotely , ~ 1 ppd  . Alcohol Use: No  . Drug Use: No  . Sexual Activity: Not on file   Other Topics Concern  . Not on file   Social History Narrative   Household-- pt and wife (wife developing dementia)   One son lives in [place], goes there from time to time                  Medication List       This list is accurate as of: 01/01/15  6:11 PM.  Always use your most recent med list.               amiodarone 200 MG tablet  Commonly known as:  PACERONE  Take 0.5 tablets (100 mg total) by mouth daily.     aspirin 81 MG tablet  Take 81 mg by mouth daily.     bisoprolol 5 MG tablet  Commonly known as:  ZEBETA  Take 1/2 tablet in the morning and 1 tablet in the evening.     CENTRUM tablet  Take 1 tablet by mouth daily.     dorzolamide 2 % ophthalmic solution  Commonly known as:  TRUSOPT  Place 1 drop  into both eyes 2 (two) times daily.     losartan-hydrochlorothiazide 100-25 MG per tablet  Commonly known as:  HYZAAR  Take 1 tablet by mouth daily.     timolol 0.5 % ophthalmic solution  Commonly known as:  TIMOPTIC  Place 1 drop into both eyes daily.           Objective:   Physical Exam BP 130/74 mmHg  Pulse 73  Temp(Src) 97.5 F (36.4 C) (Oral)  Ht 6\' 1"  (1.854 m)  Wt 260 lb 8 oz (118.162 kg)  BMI 34.38 kg/m2  SpO2 96% General:   Well developed, well nourished . NAD.  HEENT:  Normocephalic . Face symmetric, atraumatic Lungs:  CTA B Normal respiratory effort, no intercostal retractions, no accessory muscle use. Heart:   no murmur.  No pretibial edema bilaterally  Skin: Very subtle lesion at the right side of the nose. He does have several skin lesions in the back, consistent with SK's, one of them is extremely dark. MSK: Right calf is 1 inch larger in circunference compare to the left. Neurologic:  alert & oriented X3.  Speech normal, gait appropriate for age and  unassisted Psych--  Cognition and judgment appear intact.  Cooperative with normal attention span and concentration.  Behavior appropriate. No anxious or depressed appearing.        Assessment & Plan:     Hypertension, continue Hyzaar, check a BMP. Reports normal ambulatory BPs, BP today is normal  Skin lesions, at the nose and back, we refer to dermatology  Right knee fracture, release by orthopedic surgery, no pain but I note these some persistent swelling of the right leg. We'll get a ultrasound to rule out DVT.  Thyromegaly, recent ultrasound was stable.

## 2015-01-01 NOTE — Progress Notes (Signed)
Pre visit review using our clinic review tool, if applicable. No additional management support is needed unless otherwise documented below in the visit note. 

## 2015-01-01 NOTE — Patient Instructions (Signed)
Get your blood work before you leave   Will get a ultrasound  Come back to the office by 06-2015   for a physical exam  Please schedule an appointment at the front desk    Come back fasting

## 2015-01-02 ENCOUNTER — Ambulatory Visit (HOSPITAL_BASED_OUTPATIENT_CLINIC_OR_DEPARTMENT_OTHER)
Admission: RE | Admit: 2015-01-02 | Discharge: 2015-01-02 | Disposition: A | Discharge status: Home / Self Care | Payer: Medicare Other | Source: Ambulatory Visit | Attending: Internal Medicine | Admitting: Internal Medicine

## 2015-01-02 DIAGNOSIS — R600 Localized edema: Secondary | ICD-10-CM | POA: Insufficient documentation

## 2015-01-02 DIAGNOSIS — M7989 Other specified soft tissue disorders: Secondary | ICD-10-CM | POA: Diagnosis not present

## 2015-01-09 DIAGNOSIS — H52223 Regular astigmatism, bilateral: Secondary | ICD-10-CM | POA: Diagnosis not present

## 2015-01-09 DIAGNOSIS — H4011X2 Primary open-angle glaucoma, moderate stage: Secondary | ICD-10-CM | POA: Diagnosis not present

## 2015-01-09 DIAGNOSIS — H179 Unspecified corneal scar and opacity: Secondary | ICD-10-CM | POA: Diagnosis not present

## 2015-01-09 DIAGNOSIS — H5202 Hypermetropia, left eye: Secondary | ICD-10-CM | POA: Diagnosis not present

## 2015-01-09 DIAGNOSIS — H04123 Dry eye syndrome of bilateral lacrimal glands: Secondary | ICD-10-CM | POA: Diagnosis not present

## 2015-01-09 DIAGNOSIS — H11153 Pinguecula, bilateral: Secondary | ICD-10-CM | POA: Diagnosis not present

## 2015-01-09 DIAGNOSIS — H18413 Arcus senilis, bilateral: Secondary | ICD-10-CM | POA: Diagnosis not present

## 2015-01-09 DIAGNOSIS — H5211 Myopia, right eye: Secondary | ICD-10-CM | POA: Diagnosis not present

## 2015-01-29 DIAGNOSIS — C44311 Basal cell carcinoma of skin of nose: Secondary | ICD-10-CM | POA: Diagnosis not present

## 2015-01-29 DIAGNOSIS — D485 Neoplasm of uncertain behavior of skin: Secondary | ICD-10-CM | POA: Diagnosis not present

## 2015-01-29 DIAGNOSIS — L570 Actinic keratosis: Secondary | ICD-10-CM | POA: Diagnosis not present

## 2015-01-29 DIAGNOSIS — L820 Inflamed seborrheic keratosis: Secondary | ICD-10-CM | POA: Diagnosis not present

## 2015-02-11 DIAGNOSIS — C44311 Basal cell carcinoma of skin of nose: Secondary | ICD-10-CM | POA: Diagnosis not present

## 2015-02-25 DIAGNOSIS — H4011X3 Primary open-angle glaucoma, severe stage: Secondary | ICD-10-CM | POA: Diagnosis not present

## 2015-02-25 DIAGNOSIS — Z961 Presence of intraocular lens: Secondary | ICD-10-CM | POA: Diagnosis not present

## 2015-02-25 DIAGNOSIS — H4011X2 Primary open-angle glaucoma, moderate stage: Secondary | ICD-10-CM | POA: Diagnosis not present

## 2015-03-12 ENCOUNTER — Encounter: Payer: Self-pay | Admitting: *Deleted

## 2015-03-19 DIAGNOSIS — H4011X3 Primary open-angle glaucoma, severe stage: Secondary | ICD-10-CM | POA: Diagnosis not present

## 2015-03-20 ENCOUNTER — Encounter: Payer: Self-pay | Admitting: Internal Medicine

## 2015-03-20 ENCOUNTER — Ambulatory Visit (INDEPENDENT_AMBULATORY_CARE_PROVIDER_SITE_OTHER): Payer: Medicare Other | Admitting: Internal Medicine

## 2015-03-20 ENCOUNTER — Ambulatory Visit (INDEPENDENT_AMBULATORY_CARE_PROVIDER_SITE_OTHER)
Admission: RE | Admit: 2015-03-20 | Discharge: 2015-03-20 | Disposition: A | Discharge status: Home / Self Care | Payer: Medicare Other | Source: Ambulatory Visit | Attending: Internal Medicine | Admitting: Internal Medicine

## 2015-03-20 VITALS — BP 120/70 | HR 64 | Ht 74.5 in | Wt 261.2 lb

## 2015-03-20 DIAGNOSIS — I480 Paroxysmal atrial fibrillation: Secondary | ICD-10-CM | POA: Diagnosis not present

## 2015-03-20 DIAGNOSIS — R0602 Shortness of breath: Secondary | ICD-10-CM | POA: Diagnosis not present

## 2015-03-20 LAB — PULMONARY FUNCTION TEST
DL/VA % PRED: 74 %
DL/VA: 3.55 ml/min/mmHg/L
DLCO UNC % PRED: 69 %
DLCO unc: 26.23 ml/min/mmHg
FEF 25-75 POST: 2.21 L/s
FEF 25-75 Pre: 1.91 L/sec
FEF2575-%CHANGE-POST: 15 %
FEF2575-%Pred-Post: 101 %
FEF2575-%Pred-Pre: 87 %
FEV1-%Change-Post: 3 %
FEV1-%PRED-PRE: 96 %
FEV1-%Pred-Post: 100 %
FEV1-PRE: 3.13 L
FEV1-Post: 3.25 L
FEV1FVC-%CHANGE-POST: 1 %
FEV1FVC-%PRED-PRE: 101 %
FEV6-%Change-Post: 2 %
FEV6-%PRED-PRE: 100 %
FEV6-%Pred-Post: 102 %
FEV6-Post: 4.38 L
FEV6-Pre: 4.28 L
FEV6FVC-%Change-Post: 0 %
FEV6FVC-%Pred-Post: 104 %
FEV6FVC-%Pred-Pre: 104 %
FVC-%Change-Post: 1 %
FVC-%PRED-POST: 97 %
FVC-%PRED-PRE: 95 %
FVC-POST: 4.44 L
FVC-Pre: 4.37 L
PRE FEV6/FVC RATIO: 98 %
Post FEV1/FVC ratio: 73 %
Post FEV6/FVC ratio: 99 %
Pre FEV1/FVC ratio: 72 %

## 2015-03-20 LAB — CBC WITH DIFFERENTIAL/PLATELET
Basophils Absolute: 0 10*3/uL (ref 0.0–0.1)
Basophils Relative: 0.5 % (ref 0.0–3.0)
Eosinophils Absolute: 0.2 10*3/uL (ref 0.0–0.7)
Eosinophils Relative: 3.3 % (ref 0.0–5.0)
HCT: 44.4 % (ref 39.0–52.0)
Hemoglobin: 15.3 g/dL (ref 13.0–17.0)
LYMPHS ABS: 2.1 10*3/uL (ref 0.7–4.0)
LYMPHS PCT: 34.8 % (ref 12.0–46.0)
MCHC: 34.5 g/dL (ref 30.0–36.0)
MCV: 94 fl (ref 78.0–100.0)
Monocytes Absolute: 0.6 10*3/uL (ref 0.1–1.0)
Monocytes Relative: 9.3 % (ref 3.0–12.0)
NEUTROS ABS: 3.2 10*3/uL (ref 1.4–7.7)
NEUTROS PCT: 52.1 % (ref 43.0–77.0)
PLATELETS: 203 10*3/uL (ref 150.0–400.0)
RBC: 4.72 Mil/uL (ref 4.22–5.81)
RDW: 13 % (ref 11.5–15.5)
WBC: 6.1 10*3/uL (ref 4.0–10.5)

## 2015-03-20 LAB — COMPREHENSIVE METABOLIC PANEL
ALT: 30 U/L (ref 0–53)
AST: 21 U/L (ref 0–37)
Albumin: 4 g/dL (ref 3.5–5.2)
Alkaline Phosphatase: 74 U/L (ref 39–117)
BUN: 19 mg/dL (ref 6–23)
CO2: 28 mEq/L (ref 19–32)
CREATININE: 1.07 mg/dL (ref 0.40–1.50)
Calcium: 9.6 mg/dL (ref 8.4–10.5)
Chloride: 105 mEq/L (ref 96–112)
GFR: 70.04 mL/min (ref >60.00)
Glucose, Bld: 87 mg/dL (ref 70–99)
Potassium: 3.8 mEq/L (ref 3.5–5.1)
Sodium: 140 mEq/L (ref 135–145)
Total Bilirubin: 0.5 mg/dL (ref 0.2–1.2)
Total Protein: 7 g/dL (ref 6.0–8.3)

## 2015-03-20 LAB — TSH: TSH: 3.51 u[IU]/mL (ref 0.35–4.50)

## 2015-03-20 MED ORDER — BISOPROLOL FUMARATE 5 MG PO TABS: ORAL_TABLET | ORAL | Status: DC

## 2015-03-20 NOTE — Progress Notes (Signed)
Patient Care Team: Colon Branch, MD as PCP - General   HPI  Antonio [name] is a 81 y.o. male seen in followup for paroxysmal atrial fibrillation associated with a tachycardia-induced cardiomyopathy which has now normalized. He takes amiodarone.  We have discussed anticoagulation on  number of occasions. He has repeatedly declined and is still on aspirin.   The patient denies chest pain, shortness of breath, nocturnal dyspnea, orthopnea There have been no palpitations, lightheadedness or syncope.   He has a cough somewhat productive  TSH was normal earlier this year.    He has some fatigue and somnolence  He says he doesn't snore, but his wife has moved upstairs and he naps   Echocardiogram 12/10 demonstrated normal LV function with moderate biatrial enlargement--LAE (51/2.1) Past Medical History  Diagnosis Date  . Hypertension   . Cardiomyopathy     secondary to Tachycardia. A-Acute on chronic admit for CHF 6/08. B- CHF coupled with atrial fib RVR electrophysiology study 09/27/07.  A- Multiple flutters and Atrial fib- no ablation performed b- ibutilide cardoversion c- amiordarone load- d/c in sinus rhyrthm d- coumadin anticoagulation- stopped at time of GI bleed 11/09  . Mallory - Weiss tear     esophagitis, 6 unit hemorrhage- EPI injections   . Glaucoma   . Atrial fibrillation   . Appendicitis     SBP 7/09 ruputre w/ RLQ abscess 03/16/08  . Hyperlipidemia   . BPH (benign prostatic hyperplasia) 06/29/2011  . Small bowel obstruction   . Blood transfusion without reported diagnosis   . Knee fracture, right 08/2014    Past Surgical History  Procedure Laterality Date  . Hemorrhoid surgery    . Tee/dccv  02/07/07  . Ep study  09/27/07    see pmh  . Appendectomy      exploratory, ruptured appendix, RLQ abscess  . Polypectomy    . Colonoscopy      Current Outpatient Prescriptions  Medication Sig Dispense Refill  . amiodarone (PACERONE) 200 MG tablet Take 0.5 tablets (100 mg  total) by mouth daily. 45 tablet 2  . aspirin 81 MG tablet Take 81 mg by mouth daily.    . bisoprolol (ZEBETA) 5 MG tablet Take 1/2 tablet in the morning and 1 tablet in the evening. 90 tablet 2  . dorzolamide (TRUSOPT) 2 % ophthalmic solution Place 1 drop into both eyes 2 (two) times daily.      Marland Kitchen losartan-hydrochlorothiazide (HYZAAR) 100-25 MG per tablet Take 1 tablet by mouth daily. 90 tablet 2  . Multiple Vitamins-Minerals (CENTRUM) tablet Take 1 tablet by mouth daily.       No current facility-administered medications for this visit.    No Known Allergies  Review of Systems negative except from HPI and PMH  Physical Exam BP 120/70 mmHg  Pulse 64  Ht 6' 2.5" (1.892 m)  Wt 261 lb 3.2 oz (118.48 kg)  BMI 33.10 kg/m2 Well developed and well nourished in no acute distress HENT normal E scleral and icterus clear Neck Supple JVP flat; carotids brisk and full Clear to ausculation   Regular rate and rhythm, no murmurs gallops or rub Soft with active bowel sounds No clubbing cyanosis   Edema Alert and oriented, grossly normal motor and sensory function Skin Warm and Dry  ECG demonstrates sinus rhythm at 64 Intervals21/10/43 Axis left -63  Assessment and  Plan  Atrial fibrillation-quiescent  Amiodarone for #1  Resolved cardiomyopathy  Hypertension  Cough  We will  continue his amiodarone at low dose we will check a TSH recent labs 10/15 were normal  Cough in Amiodarone chronic use can be concerning  willstart with PFTs and DLCO and CXR;  Also check surveillance labs  Will check renal funtion and K on ACE  Euvolemic continue

## 2015-03-20 NOTE — Progress Notes (Signed)
PFT done today. 

## 2015-03-20 NOTE — Patient Instructions (Addendum)
Medication Instructions:  No medication changes today.  Labwork: TSH/CBCd/CMET today  Testing/Procedures: A chest x-ray takes a picture of the organs and structures inside the chest, including the heart, lungs, and blood vessels. This test can show several things, including, whether the heart is enlarges; whether fluid is building up in the lungs; and whether pacemaker / defibrillator leads are still in place. Your physician has recommended that you have a pulmonary function test. Pulmonary Function Tests are a group of tests that measure how well air moves in and out of your lungs.    Follow-Up: Your physician wants you to follow-up in: 6 months with Chanetta Marshall, NP. (January 2017). You will receive a reminder letter in the mail two months in advance. If you don't receive a letter, please call our office to schedule the follow-up appointment.     Thank you for choosing [facility]!!

## 2015-04-10 DIAGNOSIS — H4011X2 Primary open-angle glaucoma, moderate stage: Secondary | ICD-10-CM | POA: Diagnosis not present

## 2015-04-13 ENCOUNTER — Telehealth: Payer: Self-pay | Admitting: Internal Medicine

## 2015-04-13 NOTE — Telephone Encounter (Signed)
New message   Pt calling to speak to RN about test results  Please call pt back

## 2015-04-14 NOTE — Telephone Encounter (Signed)
I spoke with the patient and advised him of his lab/ chest x-ray results done recently. I advised him that his PFT report is available, but I do not see Dr. Olin Pia impression on the study. I advised I would forward to him to review and comment (on amiodarone) and call back to verify results. He is agreeable.

## 2015-04-14 NOTE — Telephone Encounter (Signed)
F/u    Pt waiting on call back from 3 test results that he had done end of July. Please advise pt.

## 2015-04-19 NOTE — Telephone Encounter (Signed)
PFTs were relatively good with DLCO about 70 %  Will continue to follow symptoms  i apologize for  Not having gotten back with him

## 2015-04-22 ENCOUNTER — Encounter: Payer: Self-pay | Admitting: Internal Medicine

## 2015-04-22 NOTE — Telephone Encounter (Signed)
This encounter was created in error - please disregard.

## 2015-04-22 NOTE — Telephone Encounter (Signed)
New message ° ° ° ° °Pt returning your call °

## 2015-04-22 NOTE — Telephone Encounter (Signed)
Call Documentation      Suan Halter at 04/22/2015 12:30 PM     Status: Signed       Expand All Collapse All   New message     Pt returning your call

## 2015-05-28 DIAGNOSIS — H35371 Puckering of macula, right eye: Secondary | ICD-10-CM | POA: Diagnosis not present

## 2015-05-28 DIAGNOSIS — Z9849 Cataract extraction status, unspecified eye: Secondary | ICD-10-CM | POA: Diagnosis not present

## 2015-05-28 DIAGNOSIS — H11153 Pinguecula, bilateral: Secondary | ICD-10-CM | POA: Diagnosis not present

## 2015-05-28 DIAGNOSIS — H18413 Arcus senilis, bilateral: Secondary | ICD-10-CM | POA: Diagnosis not present

## 2015-05-28 DIAGNOSIS — Z961 Presence of intraocular lens: Secondary | ICD-10-CM | POA: Diagnosis not present

## 2015-05-28 DIAGNOSIS — H179 Unspecified corneal scar and opacity: Secondary | ICD-10-CM | POA: Diagnosis not present

## 2015-05-28 DIAGNOSIS — H04123 Dry eye syndrome of bilateral lacrimal glands: Secondary | ICD-10-CM | POA: Diagnosis not present

## 2015-05-28 DIAGNOSIS — H401122 Primary open-angle glaucoma, left eye, moderate stage: Secondary | ICD-10-CM | POA: Diagnosis not present

## 2015-07-08 ENCOUNTER — Ambulatory Visit (INDEPENDENT_AMBULATORY_CARE_PROVIDER_SITE_OTHER): Payer: Medicare Other | Admitting: Internal Medicine

## 2015-07-08 ENCOUNTER — Encounter: Payer: Self-pay | Admitting: Internal Medicine

## 2015-07-08 VITALS — BP 132/80 | HR 57 | Temp 98.2°F | Ht 75.0 in | Wt 264.1 lb

## 2015-07-08 DIAGNOSIS — E049 Nontoxic goiter, unspecified: Secondary | ICD-10-CM | POA: Diagnosis not present

## 2015-07-08 DIAGNOSIS — Z7189 Other specified counseling: Secondary | ICD-10-CM | POA: Insufficient documentation

## 2015-07-08 DIAGNOSIS — I4891 Unspecified atrial fibrillation: Secondary | ICD-10-CM | POA: Diagnosis not present

## 2015-07-08 DIAGNOSIS — Z0000 Encounter for general adult medical examination without abnormal findings: Secondary | ICD-10-CM | POA: Diagnosis not present

## 2015-07-08 DIAGNOSIS — E010 Iodine-deficiency related diffuse (endemic) goiter: Secondary | ICD-10-CM

## 2015-07-08 DIAGNOSIS — I10 Essential (primary) hypertension: Secondary | ICD-10-CM

## 2015-07-08 DIAGNOSIS — Z23 Encounter for immunization: Secondary | ICD-10-CM | POA: Diagnosis not present

## 2015-07-08 DIAGNOSIS — Z09 Encounter for follow-up examination after completed treatment for conditions other than malignant neoplasm: Secondary | ICD-10-CM

## 2015-07-08 LAB — BASIC METABOLIC PANEL
BUN: 16 mg/dL (ref 6–23)
CALCIUM: 9.8 mg/dL (ref 8.4–10.5)
CO2: 29 meq/L (ref 19–32)
Chloride: 105 mEq/L (ref 96–112)
Creatinine, Ser: 1.04 mg/dL (ref 0.40–1.50)
GFR: 72.32 mL/min (ref >60.00)
Glucose, Bld: 102 mg/dL — ABNORMAL HIGH (ref 70–99)
Potassium: 4.5 mEq/L (ref 3.5–5.1)
SODIUM: 140 meq/L (ref 135–145)

## 2015-07-08 NOTE — Progress Notes (Signed)
Subjective:    Patient ID: Antonio [name], male    DOB: Mar 08, 1929, 81 y.o.   MRN: [id number]  DOS:  07/08/2015 Type of visit - description :   Here for Medicare AWV: 1. Risk factors based on Past M, S, F history: reviewed 2. Physical Activities: active, still works part time Financial planner), yard work    3. Depression/mood: neg screening 4. Hearing: slt  decreased per pt, some problems noted, offered referral>> declined , has OTC hearing aids; some tinnitus in the L x years, sx stable 5. ADL's: totally independent , still drives   6.  Fall Risk:  no recent falls, counseled, see instructions 7. Home Safety: does feel safe at home 8. Height, weight, &visual acuity: see VS, vision ok, s/p cataract surgery, sees  eye doctor q 3 months (glaucoma) 9.  Counseling: yes , see below   10. Labs ordered based on risk factors: yes 11.  Referral Coordination, if needed   12.  Care Plan, see a/p   13. Cognitive Assessment: motor skills appropriate for age, cognition and memory seem appropriate 14. Providers list updated 15. End of life care discussed, MOST form provided   In addition we discussed the following issues Glaucoma: Sees ophthalmology regularly, reports pressure is well controlled History of atrial fibrillation, note from cardiology reviewed, felt to be stable and back on sinus rhythm Hypertension: Good compliance with medication, ambulatory BPs within normal.   Review of Systems Constitutional: No fever. No chills. No unexplained wt changes. No unusual sweats  HEENT: No dental problems, no ear discharge, no facial swelling, no voice changes. No eye discharge, no eye  redness , no  intolerance to light   Respiratory: No wheezing , no  difficulty breathing. No cough , no mucus production  Cardiovascular: No CP, no leg swelling , no  Palpitations  GI: no nausea, no vomiting, no diarrhea , no  abdominal pain.  No blood in the stools. No dysphagia, no odynophagia    Endocrine: No  polyphagia, no polyuria , no polydipsia  GU: No dysuria, gross hematuria, difficulty urinating. No urinary urgency, no frequency.  Musculoskeletal: No joint swellings or unusual aches or pains  Skin: No change in the color of the skin, palor , no  Rash  Allergic, immunologic: No environmental allergies , no  food allergies  Neurological: No dizziness no  syncope. No headaches. No diplopia, no slurred, no slurred speech, no motor deficits, no facial  Numbness  Hematological: No enlarged lymph nodes, no easy bruising , no unusual bleedings  Psychiatry: No suicidal ideas, no hallucinations, no beavior problems, no confusion.  No unusual/severe anxiety, no depression   Past Medical History  Diagnosis Date  . Hypertension   . Cardiomyopathy     secondary to Tachycardia. A-Acute on chronic admit for CHF 6/08. B- CHF coupled with atrial fib RVR electrophysiology study 09/27/07.  A- Multiple flutters and Atrial fib- no ablation performed b- ibutilide cardoversion c- amiordarone load- d/c in sinus rhyrthm d- coumadin anticoagulation- stopped at time of GI bleed 11/09  . Mallory - Weiss tear     esophagitis, 6 unit hemorrhage- EPI injections   . Glaucoma   . Atrial fibrillation ([place])   . Appendicitis     SBP 7/09 ruputre w/ RLQ abscess 03/16/08  . Hyperlipidemia   . BPH (benign prostatic hyperplasia) 06/29/2011  . Small bowel obstruction ([place])   . Blood transfusion without reported diagnosis   . Knee fracture, right 08/2014  . Basal  cell carcinoma of nose 2016    Right side nasal tip, [name]    Past Surgical History  Procedure Laterality Date  . Hemorrhoid surgery    . Tee/dccv  02/07/07  . Ep study  09/27/07    see pmh  . Appendectomy      exploratory, ruptured appendix, RLQ abscess  . Polypectomy    . Colonoscopy    . Bcc nose surgery      Social History   Social History  . Marital Status: Married    Spouse Name: N/A  . Number of Children: 3  . Years of Education: N/A    Occupational History  . Cabinetmaker     works part time   Social History Main Topics  . Smoking status: Former Smoker    Quit date: 06/30/1971  . Smokeless tobacco: Never Used     Comment: quit tobacco remotely , ~ 1 ppd  . Alcohol Use: No  . Drug Use: No  . Sexual Activity: Not on file   Other Topics Concern  . Not on file   Social History Narrative   Household-- pt and wife (wife developing dementia)   One son lives in [place], goes there from time to time               Family History  Problem Relation Age of Onset  . Uterine cancer Mother   . Colon cancer Neg Hx   . Prostate cancer Neg Hx   . Diabetes Neg Hx   . Heart attack Maternal Uncle   . Heart disease Maternal Uncle        Medication List       This list is accurate as of: 07/08/15  5:18 PM.  Always use your most recent med list.               amiodarone 200 MG tablet  Commonly known as:  PACERONE  Take 0.5 tablets (100 mg total) by mouth daily.     aspirin 81 MG tablet  Take 81 mg by mouth daily.     bisoprolol 5 MG tablet  Commonly known as:  ZEBETA  Take 1/2 tablet in the morning and 1 tablet in the evening.     CENTRUM tablet  Take 1 tablet by mouth daily.     dorzolamide 2 % ophthalmic solution  Commonly known as:  TRUSOPT  Place 1 drop into both eyes 2 (two) times daily.     losartan-hydrochlorothiazide 100-25 MG tablet  Commonly known as:  HYZAAR  Take 1 tablet by mouth daily.           Objective:   Physical Exam BP 132/80 mmHg  Pulse 57  Temp(Src) 98.2 F (36.8 C) (Oral)  Ht 6\' 3"  (1.905 m)  Wt 264 lb 2 oz (119.806 kg)  BMI 33.01 kg/m2  SpO2 98% General:   Well developed, well nourished . NAD.  HEENT:  Normocephalic . Face symmetric, atraumatic Neck: Mild thyromegaly, no TTP Lungs:  CTA B Normal respiratory effort, no intercostal retractions, no accessory muscle use. Heart: RRR,  no murmur.  no pretibial edema bilaterally  Abdomen:  Not distended,  soft, non-tender. No rebound or rigidity. No bruit  Skin: Not pale. Not jaundice Neurologic:  alert & oriented X3.  Speech normal, gait appropriate for age and unassisted Psych--  Cognition and judgment appear intact.  Cooperative with normal attention span and concentration.  Behavior appropriate. No anxious or depressed appearing.  Assessment & Plan:   Assessment> HTN Hyperlipidemia: Has consistently declined to take medications, not checking FLPs BPH CV: --CHF, Cardiomyopathy due to tachycardia: resolved  --h/o A. Fib: declined  Anticoagulation Skin cancer: BCC Glaucoma Thyromegaly: [place] 07/2014, multiple nodules, no biopsy criteria H/o GI bleed: Mallory Wise tear, PRBC 6 H/o Appendicitis 2009 ruptured H/o SBO   Plan: HTN: Continue losartan, HCTZ, BB. Check a BMP. H/o atrial fibrillation: Seems to be in sinus rhythm today, continue with aspirin Glaucoma: Frequently check by ophthalmology. pressures controlled Hyperlipidemia: Has consistently and   strongly declined medications. Thyromegaly: Exam seems stable. Reassess clinically every year RTC one year

## 2015-07-08 NOTE — Assessment & Plan Note (Signed)
HTN: Continue losartan, HCTZ, BB. Check a BMP. H/o atrial fibrillation: Seems to be in sinus rhythm today, continue with aspirin Glaucoma: Frequently check by ophthalmology. pressures controlled Hyperlipidemia: Has consistently and   strongly declined medications. Thyromegaly: Exam seems stable. Reassess clinically every year RTC one year

## 2015-07-08 NOTE — Progress Notes (Signed)
Pre visit review using our clinic review tool, if applicable. No additional management support is needed unless otherwise documented below in the visit note. 

## 2015-07-08 NOTE — Patient Instructions (Signed)
Get your blood work before you leave    Check the  blood pressure  Monthly Be sure your blood pressure is between 110/65 and  145/85.  if it is consistently higher or lower, let me know   Next visit  for a physical exam in one year, nonfasting      Please schedule an appointment at the front desk   Fall Prevention and [place] cause injuries and can affect all age groups. It is possible to use preventive measures to significantly decrease the likelihood of falls. There are many simple measures which can make your home safer and prevent falls. OUTDOORS  Repair cracks and edges of walkways and driveways.  Remove high doorway thresholds.  Trim shrubbery on the main path into your home.  Have good outside lighting.  Clear walkways of tools, rocks, debris, and clutter.  Check that handrails are not broken and are securely fastened. Both sides of steps should have handrails.  Have leaves, snow, and ice cleared regularly.  Use sand or salt on walkways during winter months.  In the garage, clean up grease or oil spills. BATHROOM  Install night lights.  Install grab bars by the toilet and in the tub and shower.  Use non-skid mats or decals in the tub or shower.  Place a plastic non-slip stool in the shower to sit on, if needed.  Keep floors dry and clean up all water on the floor immediately.  Remove soap buildup in the tub or shower on a regular basis.  Secure bath mats with non-slip, double-sided rug tape.  Remove throw rugs and tripping hazards from the floors. BEDROOMS  Install night lights.  Make sure a bedside light is easy to reach.  Do not use oversized bedding.  Keep a telephone by your bedside.  Have a firm chair with side arms to use for getting dressed.  Remove throw rugs and tripping hazards from the floor. KITCHEN  Keep handles on pots and pans turned toward the center of the stove. Use back burners when possible.  Clean up spills  quickly and allow time for drying.  Avoid walking on wet floors.  Avoid hot utensils and knives.  Position shelves so they are not too high or low.  Place commonly used objects within easy reach.  If necessary, use a sturdy step stool with a grab bar when reaching.  Keep electrical cables out of the way.  Do not use floor polish or wax that makes floors slippery. If you must use wax, use non-skid floor wax.  Remove throw rugs and tripping hazards from the floor. STAIRWAYS  Never leave objects on stairs.  Place handrails on both sides of stairways and use them. Fix any loose handrails. Make sure handrails on both sides of the stairways are as long as the stairs.  Check carpeting to make sure it is firmly attached along stairs. Make repairs to worn or loose carpet promptly.  Avoid placing throw rugs at the top or bottom of stairways, or properly secure the rug with carpet tape to prevent slippage. Get rid of throw rugs, if possible.  Have an electrician put in a light switch at the top and bottom of the stairs. OTHER FALL PREVENTION TIPS  Wear low-heel or rubber-soled shoes that are supportive and fit well. Wear closed toe shoes.  When using a stepladder, make sure it is fully opened and both spreaders are firmly locked. Do not climb a closed stepladder.  Add color or contrast  paint or tape to grab bars and handrails in your home. Place contrasting color strips on first and last steps.  Learn and use mobility aids as needed. Install an electrical emergency response system.  Turn on lights to avoid dark areas. Replace light bulbs that burn out immediately. Get light switches that glow.  Arrange furniture to create clear pathways. Keep furniture in the same place.  Firmly attach carpet with non-skid or double-sided tape.  Eliminate uneven floor surfaces.  Select a carpet pattern that does not visually hide the edge of steps.  Be aware of all pets. OTHER HOME SAFETY  TIPS  Set the water temperature for 120 F (48.8 C).  Keep emergency numbers on or near the telephone.  Keep smoke detectors on every level of the home and near sleeping areas. Document Released: 07/29/2002 Document Revised: 02/07/2012 Document Reviewed: 10/28/2011 [facility] Patient Information 2015 [place], [place]. This information is not intended to replace advice given to you by your health care provider. Make sure you discuss any questions you have with your health care provider.   Preventive Care for Adults Ages 11 and over  Blood pressure check.** / Every 1 to 2 years.  Lipid and cholesterol check.**/ Every 5 years beginning at age 42.  Lung cancer screening. / Every year if you are aged 93-80 years and have a 30-pack-year history of smoking and currently smoke or have quit within the past 15 years. Yearly screening is stopped once you have quit smoking for at least 15 years or develop a health problem that would prevent you from having lung cancer treatment.  Fecal occult blood test (FOBT) of stool. / Every year beginning at age 12 and continuing until age 3. You may not have to do this test if you get a colonoscopy every 10 years.  Flexible sigmoidoscopy** or colonoscopy.** / Every 5 years for a flexible sigmoidoscopy or every 10 years for a colonoscopy beginning at age 48 and continuing until age 67.  Hepatitis C blood test.** / For all people born from [birth year] through 1963 and any individual with known risks for hepatitis C.  Abdominal aortic aneurysm (AAA) screening.** / A one-time screening for ages 17 to 12 years who are current or former smokers.  Skin self-exam. / Monthly.  Influenza vaccine. / Every year.  Tetanus, diphtheria, and acellular pertussis (Tdap/Td) vaccine.** / 1 dose of Td every 10 years.  Varicella vaccine.** / Consult your health care provider.  Zoster vaccine.** / 1 dose for adults aged 76 years or older.  Pneumococcal 13-valent conjugate (PCV13)  vaccine.** / Consult your health care provider.  Pneumococcal polysaccharide (PPSV23) vaccine.** / 1 dose for all adults aged 77 years and older.  Meningococcal vaccine.** / Consult your health care provider.  Hepatitis A vaccine.** / Consult your health care provider.  Hepatitis B vaccine.** / Consult your health care provider.  Haemophilus influenzae type b (Hib) vaccine.** / Consult your health care provider. **Family history and personal history of risk and conditions may change your health care provider's recommendations. Document Released: 10/04/2001 Document Revised: 08/13/2013 Document Reviewed: 01/03/2011 [facility] Patient Information 2015 [place], [place]. This information is not intended to replace advice given to you by your health care provider. Make sure you discuss any questions you have with your health care provider.

## 2015-07-08 NOTE — Assessment & Plan Note (Addendum)
Td 2011 ;pneumonia shot 2009 ; prevnar-- 2015 Flu shot today Zostavax-- never got it, see previous entries  09-2011 colonoscopy , Cscope again 11/2012--per Dr Ardis Hughs; adenomatous polyp--due to age no recall  Prostate cancer screening: Last DRE 2013, PSAs consistently normal. Declined further screening. he is asymptomatic. diet and exercise discussed

## 2015-09-03 DIAGNOSIS — Z961 Presence of intraocular lens: Secondary | ICD-10-CM | POA: Diagnosis not present

## 2015-09-03 DIAGNOSIS — H35033 Hypertensive retinopathy, bilateral: Secondary | ICD-10-CM | POA: Diagnosis not present

## 2015-09-03 DIAGNOSIS — H401122 Primary open-angle glaucoma, left eye, moderate stage: Secondary | ICD-10-CM | POA: Diagnosis not present

## 2015-09-03 DIAGNOSIS — H401113 Primary open-angle glaucoma, right eye, severe stage: Secondary | ICD-10-CM | POA: Diagnosis not present

## 2015-09-03 DIAGNOSIS — I10 Essential (primary) hypertension: Secondary | ICD-10-CM | POA: Diagnosis not present

## 2015-09-22 ENCOUNTER — Encounter: Payer: Self-pay | Admitting: Nurse Practitioner

## 2015-09-22 NOTE — Progress Notes (Signed)
Electrophysiology Office Note Date: 09/23/2015  ID:  Antonio [name], DOB 11-19-1928, MRN [id number]  PCP: Kathlene November, MD Electrophysiologist: Caryl Comes  CC: follow up for atrial fibrillation   Antonio [name] is a 82 y.o. male seen today for Dr Caryl Comes. He presents today for routine electrophysiology followup.  Since last being seen in our clinic, the patient reports doing very well.  He denies chest pain, palpitations, dyspnea, PND, orthopnea, nausea, vomiting, dizziness, syncope, edema, weight gain, or early satiety.  Past Medical History  Diagnosis Date  . Hypertension   . Cardiomyopathy     secondary to Tachycardia. A-Acute on chronic admit for CHF 6/08. B- CHF coupled with atrial fib RVR electrophysiology study 09/27/07.  A- Multiple flutters and Atrial fib- no ablation performed b- ibutilide cardoversion c- amiordarone load- d/c in sinus rhyrthm d- coumadin anticoagulation- stopped at time of GI bleed 11/09  . Mallory - Weiss tear     esophagitis, 6 unit hemorrhage- EPI injections   . Glaucoma   . Atrial fibrillation ([place])   . Appendicitis     SBP 7/09 ruputre w/ RLQ abscess 03/16/08  . Hyperlipidemia   . BPH (benign prostatic hyperplasia) 06/29/2011  . Small bowel obstruction ([place])   . Knee fracture, right 08/2014  . Basal cell carcinoma of nose 2016    Right side nasal tip, [name]   Past Surgical History  Procedure Laterality Date  . Hemorrhoid surgery    . Tee/dccv  02/07/07  . Ep study  09/27/07    see pmh  . Appendectomy      exploratory, ruptured appendix, RLQ abscess  . Polypectomy    . Colonoscopy    . Bcc nose surgery      Current Outpatient Prescriptions  Medication Sig Dispense Refill  . amiodarone (PACERONE) 200 MG tablet Take 0.5 tablets (100 mg total) by mouth daily. 45 tablet 2  . aspirin 81 MG tablet Take 81 mg by mouth daily.    . bisoprolol (ZEBETA) 5 MG tablet Take 1/2 tablet in the morning and 1 tablet in the evening. 135 tablet 2  . dorzolamide  (TRUSOPT) 2 % ophthalmic solution Place 1 drop into both eyes 2 (two) times daily.      Marland Kitchen losartan-hydrochlorothiazide (HYZAAR) 100-25 MG per tablet Take 1 tablet by mouth daily. 90 tablet 2  . Multiple Vitamins-Minerals (CENTRUM) tablet Take 1 tablet by mouth daily.       No current facility-administered medications for this visit.    Allergies:   Review of patient's allergies indicates no known allergies.   Social History: Social History   Social History  . Marital Status: Married    Spouse Name: Antonio [name]  . Number of Children: 3  . Years of Education: Antonio [name]   Occupational History  . Cabinetmaker     works part time   Social History Main Topics  . Smoking status: Former Smoker    Quit date: 06/30/1971  . Smokeless tobacco: Never Used     Comment: quit tobacco remotely , ~ 1 ppd  . Alcohol Use: No  . Drug Use: No  . Sexual Activity: Not on file   Other Topics Concern  . Not on file   Social History Narrative   Household-- pt and wife (wife developing dementia)   One son lives in [place], goes there from time to time              Family History: Family History  Problem  Relation Age of Onset  . Uterine cancer Mother   . Colon cancer Neg Hx   . Prostate cancer Neg Hx   . Diabetes Neg Hx   . Heart attack Maternal Uncle   . Heart disease Maternal Uncle     Review of Systems: All other systems reviewed and are otherwise negative except as noted above.   Physical Exam: VS:  BP 122/80 mmHg  Pulse 80  Ht 6\' 3"  (1.905 m)  Wt 259 lb (117.482 kg)  BMI 32.37 kg/m2 , BMI Body mass index is 32.37 kg/(m^2). Wt Readings from Last 3 Encounters:  09/23/15 259 lb (117.482 kg)  07/08/15 264 lb 2 oz (119.806 kg)  03/20/15 261 lb 3.2 oz (118.48 kg)    GEN- The patient is elderly appearing, alert and oriented x 3 today.   HEENT: normocephalic, atraumatic; sclera clear, conjunctiva pink; hearing intact; oropharynx clear; neck supple Lungs- Clear to ausculation bilaterally,  normal work of breathing.  No wheezes, rales, rhonchi Heart- Regular rate and rhythm GI- soft, non-tender, non-distended, bowel sounds present Extremities- no clubbing, cyanosis, or edema; DP/PT/radial pulses 2+ bilaterally MS- no significant deformity or atrophy Skin- warm and dry, no rash or lesion  Psych- euthymic mood, full affect Neuro- strength and sensation are intact   EKG:  EKG is not ordered today.  Recent Labs: 03/20/2015: ALT 30; Hemoglobin 15.3; Platelets 203.0; TSH 3.51 07/08/2015: BUN 16; Creatinine, Ser 1.04; Potassium 4.5; Sodium 140    Other studies Reviewed: Additional studies/ records that were reviewed today include: Dr Olin Pia office notes  Assessment and Plan: 1.  Paroxysmal atrial fibrillation Maintaining SR by symptoms Continue amiodarone 100mg  daily (TSH, LFT's today, annual eye exams recommended) CHADS2VASC is 3 - he remains clear in his decision to decline anticoagulation - discussed again today  2.  Previous tachycardia mediated cardiomyopathy No HF symptoms   3.  HTN Stable No change required today  4.  Rib pain He fell off his bed Sunday and has had lower rib pain since.  He is not short of breath and lungs are clear.  I have recommended that he take analgesics as needed and follow up with PCP if symptoms do not improve in a couple of weeks.     Current medicines are reviewed at length with the patient today.   The patient does not have concerns regarding his medicines.  The following changes were made today:  none  Labs/ tests ordered today include: TSH, LFT's  Disposition:   Follow up with Dr Caryl Comes in 6 months    Signed, Chanetta Marshall, NP 09/23/2015 11:26 AM   [place] [id number] [phone] (office) [phone] (fax)

## 2015-09-23 ENCOUNTER — Encounter: Payer: Self-pay | Admitting: Nurse Practitioner

## 2015-09-23 ENCOUNTER — Ambulatory Visit (INDEPENDENT_AMBULATORY_CARE_PROVIDER_SITE_OTHER): Payer: Medicare Other | Admitting: Nurse Practitioner

## 2015-09-23 VITALS — BP 122/80 | HR 80 | Ht 75.0 in | Wt 259.0 lb

## 2015-09-23 DIAGNOSIS — I480 Paroxysmal atrial fibrillation: Secondary | ICD-10-CM

## 2015-09-23 DIAGNOSIS — I10 Essential (primary) hypertension: Secondary | ICD-10-CM

## 2015-09-23 DIAGNOSIS — I4891 Unspecified atrial fibrillation: Secondary | ICD-10-CM | POA: Diagnosis not present

## 2015-09-23 LAB — TSH: TSH: 0.132 u[IU]/mL — AB (ref 0.350–4.500)

## 2015-09-23 LAB — HEPATIC FUNCTION PANEL
ALBUMIN: 4.1 g/dL (ref 3.6–5.1)
ALK PHOS: 66 U/L (ref 40–115)
ALT: 32 U/L (ref 9–46)
AST: 23 U/L (ref 10–35)
BILIRUBIN INDIRECT: 0.6 mg/dL (ref 0.2–1.2)
Bilirubin, Direct: 0.1 mg/dL (ref <=0.2)
Total Bilirubin: 0.7 mg/dL (ref 0.2–1.2)
Total Protein: 6.7 g/dL (ref 6.1–8.1)

## 2015-09-23 NOTE — Patient Instructions (Addendum)
Medication Instructions:   If you need a refill on your cardiac medications before your next appointment, please call your pharmacy.  Labwork: TSH AND LFT    Testing/Procedures:     Follow-Up:  Your physician wants you to follow-up in:  IN [place] You will receive a reminder letter in the mail two months in advance. If you don't receive a letter, please call our office to schedule the follow-up appointment.      Any Other Special Instructions Will Be Listed Below (If Applicable).

## 2015-09-25 ENCOUNTER — Telehealth: Payer: Self-pay | Admitting: *Deleted

## 2015-09-25 NOTE — Telephone Encounter (Signed)
-----   Message from Patsey Berthold, NP sent at 09/24/2015  7:16 AM EST ----- Please notify patient of lab results. TSH is low - he should follow up with his primary care. Please forward results to them as well.

## 2015-09-25 NOTE — Telephone Encounter (Signed)
-----   Message from Antonio Berthold, NP sent at 09/24/2015  7:16 AM EST ----- Please notify patient of lab results. TSH is low - he should follow up with his primary care. Please forward results to them as well.

## 2015-09-29 ENCOUNTER — Telehealth: Payer: Self-pay | Admitting: Internal Medicine

## 2015-09-29 DIAGNOSIS — E0590 Thyrotoxicosis, unspecified without thyrotoxic crisis or storm: Secondary | ICD-10-CM

## 2015-09-29 NOTE — Telephone Encounter (Signed)
Clarification: Scheduled that 1  Month  from now. thx

## 2015-09-29 NOTE — Telephone Encounter (Signed)
Did you want TSH, free T3, free T4 as soon as possible for Pt or in several weeks?

## 2015-09-29 NOTE — Telephone Encounter (Signed)
Please call the patient, last TSH was slightly suppressed, he has a h/o  goiter. Consider endocrine referral or thyroid scan. Plan: Arrange for a TSH, free T3, free T4. DX hyperthyroidism.

## 2015-09-30 NOTE — Telephone Encounter (Signed)
Tried calling Pt, no answer, answer machine picked up but was unable to leave message. Will try again later.

## 2015-09-30 NOTE — Telephone Encounter (Signed)
LMOM informing Pt that we received his latest TSH results and Dr. Larose Kells recommends repeating labs in 1 month, lab appt only. Instructed Pt to call and schedule lab appt for 1 month. Free T3, Free T4 and TSH ordered.

## 2015-10-30 ENCOUNTER — Encounter: Payer: Self-pay | Admitting: Internal Medicine

## 2015-10-30 ENCOUNTER — Ambulatory Visit (INDEPENDENT_AMBULATORY_CARE_PROVIDER_SITE_OTHER): Payer: Medicare Other | Admitting: Internal Medicine

## 2015-10-30 VITALS — BP 124/58 | HR 98 | Temp 98.1°F | Ht 75.0 in | Wt 237.4 lb

## 2015-10-30 DIAGNOSIS — R197 Diarrhea, unspecified: Secondary | ICD-10-CM | POA: Diagnosis not present

## 2015-10-30 DIAGNOSIS — Z09 Encounter for follow-up examination after completed treatment for conditions other than malignant neoplasm: Secondary | ICD-10-CM

## 2015-10-30 DIAGNOSIS — E041 Nontoxic single thyroid nodule: Secondary | ICD-10-CM

## 2015-10-30 DIAGNOSIS — R7989 Other specified abnormal findings of blood chemistry: Secondary | ICD-10-CM

## 2015-10-30 LAB — TSH: TSH: 0.07 u[IU]/mL — ABNORMAL LOW (ref 0.35–4.50)

## 2015-10-30 LAB — BASIC METABOLIC PANEL
BUN: 44 mg/dL — AB (ref 6–23)
CHLORIDE: 101 meq/L (ref 96–112)
CO2: 30 meq/L (ref 19–32)
CREATININE: 1.46 mg/dL (ref 0.40–1.50)
Calcium: 10 mg/dL (ref 8.4–10.5)
GFR: 48.86 mL/min — ABNORMAL LOW (ref >60.00)
Glucose, Bld: 106 mg/dL — ABNORMAL HIGH (ref 70–99)
Potassium: 3.2 mEq/L — ABNORMAL LOW (ref 3.5–5.1)
Sodium: 141 mEq/L (ref 135–145)

## 2015-10-30 LAB — CBC WITH DIFFERENTIAL/PLATELET
BASOS PCT: 0.2 % (ref 0.0–3.0)
Basophils Absolute: 0 10*3/uL (ref 0.0–0.1)
EOS ABS: 0.1 10*3/uL (ref 0.0–0.7)
Eosinophils Relative: 1.9 % (ref 0.0–5.0)
HEMATOCRIT: 43.1 % (ref 39.0–52.0)
Hemoglobin: 14.9 g/dL (ref 13.0–17.0)
Lymphocytes Relative: 19.1 % (ref 12.0–46.0)
Lymphs Abs: 1.5 10*3/uL (ref 0.7–4.0)
MCHC: 34.5 g/dL (ref 30.0–36.0)
MCV: 90 fl (ref 78.0–100.0)
Monocytes Absolute: 0.9 10*3/uL (ref 0.1–1.0)
Monocytes Relative: 11 % (ref 3.0–12.0)
NEUTROS ABS: 5.3 10*3/uL (ref 1.4–7.7)
Neutrophils Relative %: 67.8 % (ref 43.0–77.0)
PLATELETS: 267 10*3/uL (ref 150.0–400.0)
RBC: 4.79 Mil/uL (ref 4.22–5.81)
RDW: 12.5 % (ref 11.5–15.5)
WBC: 7.8 10*3/uL (ref 4.0–10.5)

## 2015-10-30 LAB — T4, FREE: Free T4: 3.08 ng/dL — ABNORMAL HIGH (ref 0.60–1.60)

## 2015-10-30 LAB — T3, FREE: T3 FREE: 6.7 pg/mL — AB (ref 2.3–4.2)

## 2015-10-30 NOTE — Progress Notes (Signed)
Subjective:    Patient ID: Antonio [name], male    DOB: 02-Nov-1928, 82 y.o.   MRN: [id number]  DOS:  10/30/2015 Type of visit - description : To discuss TSH Interval history:  Recently his TSH was found to be low. Also he mentioned that he is not feeling well for the last 2 weeks, developed watery diarrhea, appetite is poor. Diarrhea has improved, only 1 BM yesterday, none today. 2 weeks ago wife had nausea and vomiting, she is better. Medications reviewed: Good compliance.   Review of Systems No fever chills, minimal nasal congestion/runny nose. No nausea or vomiting. No blood in the stools. Mild headache some days. No dizziness. Has been taking fluids without problems  Past Medical History  Diagnosis Date  . Hypertension   . Cardiomyopathy     secondary to Tachycardia. A-Acute on chronic admit for CHF 6/08. B- CHF coupled with atrial fib RVR electrophysiology study 09/27/07.  A- Multiple flutters and Atrial fib- no ablation performed b- ibutilide cardoversion c- amiordarone load- d/c in sinus rhyrthm d- coumadin anticoagulation- stopped at time of GI bleed 11/09  . Mallory - Weiss tear     esophagitis, 6 unit hemorrhage- EPI injections   . Glaucoma   . Atrial fibrillation ([place])   . Appendicitis     SBP 7/09 ruputre w/ RLQ abscess 03/16/08  . Hyperlipidemia   . BPH (benign prostatic hyperplasia) 06/29/2011  . Small bowel obstruction ([place])   . Knee fracture, right 08/2014  . Basal cell carcinoma of nose 2016    Right side nasal tip, [name]    Past Surgical History  Procedure Laterality Date  . Hemorrhoid surgery    . Tee/dccv  02/07/07  . Ep study  09/27/07    see pmh  . Appendectomy      exploratory, ruptured appendix, RLQ abscess  . Polypectomy    . Colonoscopy    . Bcc nose surgery      Social History   Social History  . Marital Status: Married    Spouse Name: N/A  . Number of Children: 3  . Years of Education: N/A   Occupational History  . Cabinetmaker       works part time   Social History Main Topics  . Smoking status: Former Smoker    Quit date: 06/30/1971  . Smokeless tobacco: Never Used     Comment: quit tobacco remotely , ~ 1 ppd  . Alcohol Use: No  . Drug Use: No  . Sexual Activity: Not on file   Other Topics Concern  . Not on file   Social History Narrative   Household-- pt and wife (wife developing dementia)   One son lives in [place], goes there from time to time                  Medication List       This list is accurate as of: 10/30/15 11:18 AM.  Always use your most recent med list.               amiodarone 200 MG tablet  Commonly known as:  PACERONE  Take 0.5 tablets (100 mg total) by mouth daily.     aspirin 81 MG tablet  Take 81 mg by mouth daily.     bisoprolol 5 MG tablet  Commonly known as:  ZEBETA  Take 1/2 tablet in the morning and 1 tablet in the evening.     CENTRUM tablet  Take  1 tablet by mouth daily.     dorzolamide 2 % ophthalmic solution  Commonly known as:  TRUSOPT  Place 1 drop into both eyes 2 (two) times daily.     losartan-hydrochlorothiazide 100-25 MG tablet  Commonly known as:  HYZAAR  Take 1 tablet by mouth daily.           Objective:   Physical Exam BP 124/58 mmHg  Pulse 98  Temp(Src) 98.1 F (36.7 C) (Oral)  Ht 6\' 3"  (1.905 m)  Wt 237 lb 6 oz (107.673 kg)  BMI 29.67 kg/m2  SpO2 99% General:   Well developed, well nourished . NAD.  HEENT:  Normocephalic . Face symmetric, atraumatic. Neck: enlarged R thyroid ?Marland Kitchen No TTP Lungs:  CTA B Normal respiratory effort, no intercostal retractions, no accessory muscle use. Heart: RRR,  no murmur.  no pretibial edema bilaterally  Abdomen:  Not distended, soft, non-tender. No rebound or rigidity.   Skin: Not pale. Not jaundice Neurologic:  alert & oriented X3.  Speech normal, gait appropriate for age and unassisted Psych--  Cognition and judgment appear intact.  Cooperative with normal attention span and  concentration.  Behavior appropriate. No anxious or depressed appearing.      Assessment & Plan:   Assessment> HTN Hyperlipidemia: Has consistently declined to take medications, not checking FLPs BPH CV: --CHF, Cardiomyopathy due to tachycardia: resolved  --h/o A. Fib: declined  Anticoagulation. On amiodaorone Skin cancer: BCC Glaucoma Thyromegaly: [place] 07/2014, multiple nodules, no biopsy criteria H/o GI bleed: Mallory Wise tear, PRBC 6 H/o Appendicitis 2009 ruptured H/o SBO   Plan: Decreased TSH: Check TFTs H/o  Thyroid nodules: on exam he seems to have a enlarged right thyroid nodule, check ultrasound. Diarrhea: Started 2 weeks ago, he is feeling better, will check a BMP, CBC. Rx align, see instructions RTC 2 months

## 2015-10-30 NOTE — Assessment & Plan Note (Signed)
Decreased TSH: Check TFTs H/o  Thyroid nodules: on exam he seems to have a enlarged right thyroid nodule, check ultrasound. Diarrhea: Started 2 weeks ago, he is feeling better, will check a BMP, CBC. Rx align, see instructions RTC 2 months

## 2015-10-30 NOTE — Progress Notes (Signed)
Pre visit review using our clinic review tool, if applicable. No additional management support is needed unless otherwise documented below in the visit note. 

## 2015-10-30 NOTE — Patient Instructions (Signed)
++++  GO TO THE LAB : Get the blood work    +++GO TO THE FRONT DESK  Schedule a routine office visit or check up to be done in  2 months  No need to be fasting  Front desk:  15    For diarrhea: Drink plenty of fluids Start OTC probiotic called align 1 tablet daily for 4 weeks Call if you are not back to normal in 2 weeks Call anytime if you have severe symptoms, blood in the stools or if  simply not feeling better soon.

## 2015-11-02 ENCOUNTER — Ambulatory Visit (HOSPITAL_BASED_OUTPATIENT_CLINIC_OR_DEPARTMENT_OTHER)
Admission: RE | Admit: 2015-11-02 | Discharge: 2015-11-02 | Disposition: A | Discharge status: Home / Self Care | Payer: Medicare Other | Source: Ambulatory Visit | Attending: Internal Medicine | Admitting: Internal Medicine

## 2015-11-02 DIAGNOSIS — R7989 Other specified abnormal findings of blood chemistry: Secondary | ICD-10-CM | POA: Insufficient documentation

## 2015-11-02 DIAGNOSIS — E042 Nontoxic multinodular goiter: Secondary | ICD-10-CM | POA: Insufficient documentation

## 2015-11-05 ENCOUNTER — Telehealth: Payer: Self-pay | Admitting: Internal Medicine

## 2015-11-05 DIAGNOSIS — E0590 Thyrotoxicosis, unspecified without thyrotoxic crisis or storm: Secondary | ICD-10-CM

## 2015-11-05 NOTE — Telephone Encounter (Signed)
Pt called for lab and imaging results. Pt requesting call back at [phone].

## 2015-11-05 NOTE — Telephone Encounter (Signed)
LMOM informing Pt of lab and [place] results. Informed him that Dr. Larose Kells recommends him to see an endocrinologist (referral placed), informed him he will receive a call to schedule an appt. Also informed him to start an OTC potassium supplement due to low potassium. Instructed him to call if he has any further questions.

## 2015-11-05 NOTE — Telephone Encounter (Signed)
TFTs consistent with hyperthyroidism, potassium is slightly low. Discuss with Dr Caryl Comes, he would like to keep the patient on amiodarone if possible. Plan is to refer him to endocrinology; I called the patient, no answer.     KaylynTenny Craw a endocrinology referral, DX hyperthyroidism    Needs OTC potassium one tablet daily    Please call the patient and let him him know of above

## 2015-11-17 ENCOUNTER — Telehealth: Payer: Self-pay | Admitting: Internal Medicine

## 2015-11-17 NOTE — Telephone Encounter (Signed)
Patient Name: Antonio [name]  DOB: 07-26-1929    Initial Comment Caller states father needs an appt- still having diarrhea- dizziness- lost about 30 lbs in a month- looks dehydrates.    Nurse Assessment  Nurse: Mallie Mussel, RN, Alveta Heimlich Date/Time Eilene Ghazi Time): 11/17/2015 10:31:36 AM  Confirm and document reason for call. If symptomatic, describe symptoms. You must click the next button to save text entered. ---Caller states that her father has had diarrhea for a month now. He has passed diarrhea x 1 this morning when the diarrhea began again. He has lost 30 lbs in a month. He took some Immodium and it helped for a couple days. He's back to diarrhea, he is weak, about passed out in the shower. He last urinated this morning. He denies his mouth being dry. On Friday, he looked dehydrated. Denies fever. He has not vomited any in the last 24 hours.  Has the patient traveled out of the country within the last 30 days? ---No  Does the patient have any new or worsening symptoms? ---Yes  Will a triage be completed? ---Yes  Related visit to physician within the last 2 weeks? ---No  Does the PT have any chronic conditions? (i.e. diabetes, asthma, etc.) ---Yes  List chronic conditions. ---HTN, Hypercholesterolemia  Is this a behavioral health or substance abuse call? ---No     Guidelines    Guideline Title Affirmed Question Affirmed Notes  Diarrhea [1] MILD (e.g., 1-3 or more stools than normal in past 24 hours) diarrhea without known cause AND [2] present > 7 days    Final Disposition User   See PCP When Office is Open (within 3 days) Mallie Mussel, RN, Alveta Heimlich    Comments  Appointment already scheduled for tomorrow at 2:30pm with Dr. Larose Kells.   Referrals  REFERRED TO PCP OFFICE   Disagree/Comply: Comply

## 2015-11-17 NOTE — Telephone Encounter (Signed)
Appt noted

## 2015-11-18 ENCOUNTER — Ambulatory Visit (INDEPENDENT_AMBULATORY_CARE_PROVIDER_SITE_OTHER): Payer: Medicare Other | Admitting: Internal Medicine

## 2015-11-18 ENCOUNTER — Encounter: Payer: Self-pay | Admitting: Internal Medicine

## 2015-11-18 VITALS — BP 122/76 | HR 83 | Temp 98.1°F | Ht 75.0 in | Wt 228.5 lb

## 2015-11-18 DIAGNOSIS — E041 Nontoxic single thyroid nodule: Secondary | ICD-10-CM

## 2015-11-18 DIAGNOSIS — R197 Diarrhea, unspecified: Secondary | ICD-10-CM

## 2015-11-18 DIAGNOSIS — R634 Abnormal weight loss: Secondary | ICD-10-CM | POA: Diagnosis not present

## 2015-11-18 DIAGNOSIS — Z09 Encounter for follow-up examination after completed treatment for conditions other than malignant neoplasm: Secondary | ICD-10-CM

## 2015-11-18 DIAGNOSIS — E0590 Thyrotoxicosis, unspecified without thyrotoxic crisis or storm: Secondary | ICD-10-CM

## 2015-11-18 NOTE — Progress Notes (Signed)
Pre visit review using our clinic review tool, if applicable. No additional management support is needed unless otherwise documented below in the visit note. 

## 2015-11-18 NOTE — Patient Instructions (Signed)
Please go to the lab and pick up containers for stool studies: Culture, C. difficile, white blood cells. Bring   the sample as soon as you can.  Drink plenty of clear fluids  Come back in 2 weeks, please make an appointment

## 2015-11-18 NOTE — Progress Notes (Signed)
Subjective:    Patient ID: Antonio [name], male    DOB: [birth year], 82 y.o.   MRN: [id number]  DOS:  11/18/2015 Type of visit - description : Acute visit Interval history: Was seen last w/ a two-week history of diarrhea; also labs were consistent with hyperthyroid. We have not been able to refer him to endocrinology just yet.  Is here because the patient's daughter noted that he was not feeling well.  Patient reports he continue with diarrhea, usually 1 large watery bowel movement a day. She thinks he is drinking enough fluids but is still is feeling weak and somewhat dizzy when he stands up. Since yesterday he has increased his fluid intake and feels slightly better.    Wt Readings from Last 3 Encounters:  11/18/15 228 lb 8 oz (103.647 kg)  10/30/15 237 lb 6 oz (107.673 kg)  09/23/15 259 lb (117.482 kg)     Review of Systems Denies fever or chills. Admits to extremely poor appetite however when asked, reports that he does not have any abdominal pain or dysphagia postprandially. Denies chest pain, difficulty breathing No lower extremity edema or palpitations No nausea vomiting or blood in the stools. No pain. No anxiety or depression per se.   Past Medical History  Diagnosis Date  . Hypertension   . Cardiomyopathy     secondary to Tachycardia. A-Acute on chronic admit for CHF 6/08. B- CHF coupled with atrial fib RVR electrophysiology study 09/27/07.  A- Multiple flutters and Atrial fib- no ablation performed b- ibutilide cardoversion c- amiordarone load- d/c in sinus rhyrthm d- coumadin anticoagulation- stopped at time of GI bleed 11/09  . Mallory - Weiss tear     esophagitis, 6 unit hemorrhage- EPI injections   . Glaucoma   . Atrial fibrillation ([place])   . Appendicitis     SBP 7/09 ruputre w/ RLQ abscess 03/16/08  . Hyperlipidemia   . BPH (benign prostatic hyperplasia) 06/29/2011  . Small bowel obstruction ([place])   . Knee fracture, right 08/2014  . Basal cell carcinoma of  nose 2016    Right side nasal tip, [name]    Past Surgical History  Procedure Laterality Date  . Hemorrhoid surgery    . Tee/dccv  02/07/07  . Ep study  09/27/07    see pmh  . Appendectomy      exploratory, ruptured appendix, RLQ abscess  . Polypectomy    . Colonoscopy    . Bcc nose surgery      Social History   Social History  . Marital Status: Married    Spouse Name: N/A  . Number of Children: 3  . Years of Education: N/A   Occupational History  . Cabinetmaker     works part time   Social History Main Topics  . Smoking status: Former Smoker    Quit date: 06/30/1971  . Smokeless tobacco: Never Used     Comment: quit tobacco remotely , ~ 1 ppd  . Alcohol Use: No  . Drug Use: No  . Sexual Activity: Not on file   Other Topics Concern  . Not on file   Social History Narrative   Household-- pt and wife (wife developing dementia)   One son lives in [place], goes there from time to time                  Medication List       This list is accurate as of: 11/18/15 11:59 PM.  Always  use your most recent med list.               amiodarone 200 MG tablet  Commonly known as:  PACERONE  Take 0.5 tablets (100 mg total) by mouth daily.     aspirin 81 MG tablet  Take 81 mg by mouth daily.     bisoprolol 5 MG tablet  Commonly known as:  ZEBETA  Take 1/2 tablet in the morning and 1 tablet in the evening.     CENTRUM tablet  Take 1 tablet by mouth daily.     dorzolamide 2 % ophthalmic solution  Commonly known as:  TRUSOPT  Place 1 drop into both eyes 2 (two) times daily.     losartan-hydrochlorothiazide 100-25 MG tablet  Commonly known as:  HYZAAR  Take 1 tablet by mouth daily.           Objective:   Physical Exam BP 122/76 mmHg  Pulse 83  Temp(Src) 98.1 F (36.7 C) (Oral)  Ht 6\' 3"  (1.905 m)  Wt 228 lb 8 oz (103.647 kg)  BMI 28.56 kg/m2  SpO2 98% General:   Well developed, weight  loss noted,   in no distress, does not look toxic or  cachectic.  HEENT:  Normocephalic . Face symmetric, atraumatic Neck: No thyromegaly Lungs:  CTA B Normal respiratory effort, no intercostal retractions, no accessory muscle use. Heart: RRR,  no murmur.  no pretibial edema bilaterally  Abdomen:  Not distended, soft, non-tender. No rebound or rigidity.  Skin: Not pale. Not jaundice Neurologic:  alert & oriented X3.  Speech normal, gait appropriate for age and unassisted Psych--  Cognition and judgment appear intact.  Cooperative with normal attention span and concentration.  Behavior appropriate. No anxious or depressed appearing.    Assessment & Plan:   Assessment> HTN Hyperlipidemia: Has consistently declined to take medications, not checking FLPs BPH CV: --CHF, Cardiomyopathy due to tachycardia: resolved  --h/o A. Fib: declined  Anticoagulation. On amiodaorone Skin cancer: BCC Glaucoma Thyromegaly: [place] 07/2014, multiple nodules, no biopsy criteria H/o GI bleed: Mallory Wise tear, PRBC 6 H/o Appendicitis 2009 ruptured H/o SBO   Plan: Diarrhea: Ongoing for approximately 6 weeks, no recent antibiotic intake. Recent  TSH suppressed and that could account for his problem. Orthostatic VSs neg Plan: Stool studies, Imodium as needed. Drink plenty of fluids Thyroid nodules: Ultrasound 11/02/2015 --> stable, no biopsy indicated. Hyperthyroidism: see TFTs, results were discussed with cardiology before, he is on amiodarone and will try to keep him on it per cards rec, will ask endocrinology to see ASAP. Addendum: Available visit these 12/02/2015, in the meantime [name] suggested to start methimazole 10 mg twice a day. Chart reviewed, most recent CBC and LFTs normal. Called patient, advised to call if any side effects including fever, chills, rash Weight loss: Quite concerning amount of weight loss, ? related with hyperthyroidism  RTC 2 weeks   Today, I spent more than 28   min with the patient: >50% of the time counseling  regards to potential questions regards his symptoms, discuss the treatment plan with the patient and his daughter who was visiting. Coordinating his care with endocrinology.

## 2015-11-19 ENCOUNTER — Other Ambulatory Visit: Payer: Self-pay | Admitting: Internal Medicine

## 2015-11-19 ENCOUNTER — Other Ambulatory Visit: Payer: Medicare Other

## 2015-11-19 DIAGNOSIS — R197 Diarrhea, unspecified: Secondary | ICD-10-CM

## 2015-11-19 MED ORDER — METHIMAZOLE 10 MG PO TABS: 10.0000 mg | ORAL_TABLET | Freq: Two times a day (BID) | ORAL | Status: DC

## 2015-11-19 NOTE — Assessment & Plan Note (Signed)
Diarrhea: Ongoing for approximately 6 weeks, no recent antibiotic intake. Recent  TSH suppressed and that could account for his problem. Orthostatic VSs neg Plan: Stool studies, Imodium as needed. Drink plenty of fluids Thyroid nodules: Ultrasound 11/02/2015 --> stable, no biopsy indicated. Hyperthyroidism: see TFTs, results were discussed with cardiology before, he is on amiodarone and will try to keep him on it per cards rec, will ask endocrinology to see ASAP. Addendum: Available visit these 12/02/2015, in the meantime [name] suggested to start methimazole 10 mg twice a day. Chart reviewed, most recent CBC and LFTs normal. Called patient, advised to call if any side effects including fever, chills, rash Weight loss: Quite concerning amount of weight loss, ? related with hyperthyroidism  RTC 2 weeks

## 2015-11-20 ENCOUNTER — Ambulatory Visit (INDEPENDENT_AMBULATORY_CARE_PROVIDER_SITE_OTHER): Payer: Medicare Other | Admitting: Endocrinology

## 2015-11-20 ENCOUNTER — Encounter: Payer: Self-pay | Admitting: Endocrinology

## 2015-11-20 VITALS — BP 102/63 | HR 92 | Temp 97.6°F | Resp 16 | Ht 75.0 in | Wt 228.6 lb

## 2015-11-20 DIAGNOSIS — T462X5A Adverse effect of other antidysrhythmic drugs, initial encounter: Secondary | ICD-10-CM

## 2015-11-20 DIAGNOSIS — E0580 Other thyrotoxicosis without thyrotoxic crisis or storm: Secondary | ICD-10-CM | POA: Diagnosis not present

## 2015-11-20 LAB — FECAL LACTOFERRIN, QUANT: LACTOFERRIN: NEGATIVE

## 2015-11-20 LAB — C. DIFFICILE GDH AND TOXIN A/B
C. DIFF TOXIN A/B: NOT DETECTED
C. DIFFICILE GDH: NOT DETECTED

## 2015-11-20 NOTE — Patient Instructions (Addendum)
Stay on mehimazole

## 2015-11-20 NOTE — Progress Notes (Signed)
Patient ID: Antonio [name], male   DOB: [birth year], 82 y.o.   MRN: [id number]                                                                                                               Reason for Appointment:  Hyperthyroidism, new consultation  Referring physician: Larose Kells   History of Present Illness:   For the last 3 months the patient has had symptoms of decreased appetite, aggressive weight loss, tiredness and weakness He does not complain of any palpitations, shakiness, feeling excessively warm and sweaty or any nervousness The patient says that he has been on amiodarone since his atrial fibrillation was diagnosed about 7 years ago  The patient has lost about 30 lbs since these symptoms started  Wt Readings from Last 3 Encounters:  11/20/15 228 lb 9.6 oz (103.692 kg)  11/18/15 228 lb 8 oz (103.647 kg)  10/30/15 237 lb 6 oz (107.673 kg)     He was seen by his PCP for evaluation of these symptoms and thyroid function were also checked, previous levels had not been checked since 2011.  When first evaluated with thyroid function tests they showed the following:     Lab Results  Component Value Date   FREET4 3.08* 10/30/2015   FREET4 0.73 06/28/2010   FREET4 0.8 10/15/2009   T3FREE 6.7* 10/30/2015   T3FREE 2.6 06/28/2010   T3FREE 2.6 10/15/2009   TSH 0.07* 10/30/2015   TSH 0.132* 09/23/2015   TSH 3.51 03/20/2015          Medication List       This list is accurate as of: 11/20/15 10:25 AM.  Always use your most recent med list.               amiodarone 200 MG tablet  Commonly known as:  PACERONE  Take 0.5 tablets (100 mg total) by mouth daily.     aspirin 81 MG tablet  Take 81 mg by mouth daily.     bisoprolol 5 MG tablet  Commonly known as:  ZEBETA  Take 1/2 tablet in the morning and 1 tablet in the evening.     CENTRUM tablet  Take 1 tablet by mouth daily.     dorzolamide 2 % ophthalmic solution  Commonly known as:  TRUSOPT  Place 1 drop into both  eyes 2 (two) times daily.     losartan-hydrochlorothiazide 100-25 MG tablet  Commonly known as:  HYZAAR  Take 1 tablet by mouth daily.     methimazole 5 MG tablet  Commonly known as:  TAPAZOLE  Take 5 mg by mouth 3 (three) times daily. Takes 1 tablet twice a day            Past Medical History  Diagnosis Date  . Hypertension   . Cardiomyopathy     secondary to Tachycardia. A-Acute on chronic admit for CHF 6/08. B- CHF coupled with atrial fib RVR electrophysiology study 09/27/07.  A- Multiple flutters and Atrial fib- no ablation performed b- ibutilide cardoversion  c- amiordarone load- d/c in sinus rhyrthm d- coumadin anticoagulation- stopped at time of GI bleed 11/09  . Mallory - Weiss tear     esophagitis, 6 unit hemorrhage- EPI injections   . Glaucoma   . Atrial fibrillation ([place])   . Appendicitis     SBP 7/09 ruputre w/ RLQ abscess 03/16/08  . Hyperlipidemia   . BPH (benign prostatic hyperplasia) 06/29/2011  . Small bowel obstruction ( [place])   . Knee fracture, right 08/2014  . Basal cell carcinoma of nose 2016    Right side nasal tip, [name]    Past Surgical History  Procedure Laterality Date  . Hemorrhoid surgery    . Tee/dccv  02/07/07  . Ep study  09/27/07    see pmh  . Appendectomy      exploratory, ruptured appendix, RLQ abscess  . Polypectomy    . Colonoscopy    . Bcc nose surgery      Family History  Problem Relation Age of Onset  . Uterine cancer Mother   . Colon cancer Neg Hx   . Prostate cancer Neg Hx   . Diabetes Neg Hx   . Heart attack Maternal Uncle   . Heart disease Maternal Uncle     Social History:  reports that he quit smoking about 44 years ago. He has never used smokeless tobacco. He reports that he does not drink alcohol or use illicit drugs.  Allergies: No Known Allergies  Review of Systems:  Review of Systems  Constitutional: Positive for reduced appetite.  HENT: Negative for headaches.   Eyes: Negative for blurred vision.    Respiratory: Negative for shortness of breath.   Cardiovascular: Negative for palpitations and leg swelling.  Gastrointestinal: Positive for diarrhea.  Endocrine: Positive for fatigue. Negative for heat intolerance and polydipsia.  Musculoskeletal: Negative for joint pain.  Skin: Negative for rash.  Neurological: Positive for weakness. Negative for tremors.  Psychiatric/Behavioral: Negative for insomnia.      Examination:   BP 102/63 mmHg  Pulse 92  Temp(Src) 97.6 F (36.4 C)  Resp 16  Ht 6\' 3"  (1.905 m)  Wt 228 lb 9.6 oz (103.692 kg)  BMI 28.57 kg/m2  SpO2 97%   General Appearance:  well-built and nourished, pleasant, not anxious or hyperkinetic.        Eyes: No unusual prominence, lid lag or stare. No swelling of the eyelids  Neck: The thyroid is palpable on swallowing, probably about twice normal on the right and less prominent on the left, firm and smooth There is no lymphadenopathy .          Heart:  relatively distant S1 and S2, no murmur.  Heart rhythm appears to be irregular and about 90 .          Lungs:  has barrel shaped  chest breath sounds are clear bilaterally Abdomen: no hepatosplenomegaly or other palpable abnormality  Extremities: hands are warm. No ankle edema. Neurological: Deep tendon reflexes at biceps are difficult to elicit but appear slightly brisk on the left.  Ankle reflexes absent No fine tremors are present. Skin: No rash, abnormal thickening of the skin on legs or pigmentation seen     Assessment/Plan:   Hyperthyroidism, Likely to be from amiodarone    Discussed with the patient the hyperthyroidism is secondary to long-term use of amiodarone This is likely causing symptoms of weight loss of, diarrhea and anorexia although a GI cause cannot be ruled out  His thyroid levels are significantly high  including T3 levels He does have an underlying multinodular goiter which may make him susceptible to the Jod Basedow effect of iodine in the family  drawing  He needs to be treated with antithyroid drugs to help him symptomatically and get his thyroid levels under control This will need to be continued until he is in remission  However needs to be changed from amiodarone to another antiarrhythmic drug and will need to notify his cardiologist   [facility] 11/20/2015, 10:25 AM

## 2015-11-24 LAB — STOOL CULTURE

## 2015-11-24 NOTE — Telephone Encounter (Signed)
Pt called in. He states that he needed to call in to give an update on his condition. He says that he is improving, eating better, pt says that he doesn't still have diarrhea. Pt says that he is making slight improvement. Pt says that Dr Dwyane Dee thinks that one of his medications is causing his thyroid concern.      CB: [id number]

## 2015-11-24 NOTE — Telephone Encounter (Signed)
FYI

## 2015-12-02 ENCOUNTER — Ambulatory Visit: Payer: Medicare Other | Admitting: Endocrinology

## 2015-12-02 ENCOUNTER — Ambulatory Visit (INDEPENDENT_AMBULATORY_CARE_PROVIDER_SITE_OTHER): Payer: Medicare Other | Admitting: Internal Medicine

## 2015-12-02 ENCOUNTER — Encounter: Payer: Self-pay | Admitting: Internal Medicine

## 2015-12-02 VITALS — BP 110/68 | HR 98 | Temp 98.1°F | Ht 75.0 in | Wt 226.0 lb

## 2015-12-02 DIAGNOSIS — R197 Diarrhea, unspecified: Secondary | ICD-10-CM

## 2015-12-02 DIAGNOSIS — Z09 Encounter for follow-up examination after completed treatment for conditions other than malignant neoplasm: Secondary | ICD-10-CM | POA: Diagnosis not present

## 2015-12-02 DIAGNOSIS — I4891 Unspecified atrial fibrillation: Secondary | ICD-10-CM | POA: Diagnosis not present

## 2015-12-02 DIAGNOSIS — E0590 Thyrotoxicosis, unspecified without thyrotoxic crisis or storm: Secondary | ICD-10-CM

## 2015-12-02 MED ORDER — AZELASTINE HCL 0.1 % NA SOLN: 2.0000 | Freq: Every evening | NASAL | Status: DC | PRN

## 2015-12-02 NOTE — Progress Notes (Signed)
Subjective:    Patient ID: Antonio [name], male    DOB: 26-Jul-1929, 82 y.o.   MRN: [id number]  DOS:  12/02/2015 Type of visit - description : Follow-up Interval history: Since the last office visit, he saw endocrinology, prescribed Tapazole. Overall feels better Frequent bowel movements have decreased and is back to normal, appetite is okay, he is drinking plenty of fluids. Reports a three-day history of postnasal dripping, clear nasal discharge, cough at night. Wonders if his allergies or side effect of the new medication.  Wt Readings from Last 3 Encounters:  12/02/15 226 lb (102.513 kg)  11/20/15 228 lb 9.6 oz (103.692 kg)  11/18/15 228 lb 8 oz (103.647 kg)    Review of Systems  No fever chills No itchy eyes or nose No rash anywhere Some chest congestion. Past Medical History  Diagnosis Date  . Hypertension   . Cardiomyopathy     secondary to Tachycardia. A-Acute on chronic admit for CHF 6/08. B- CHF coupled with atrial fib RVR electrophysiology study 09/27/07.  A- Multiple flutters and Atrial fib- no ablation performed b- ibutilide cardoversion c- amiordarone load- d/c in sinus rhyrthm d- coumadin anticoagulation- stopped at time of GI bleed 11/09  . Mallory - Weiss tear     esophagitis, 6 unit hemorrhage- EPI injections   . Glaucoma   . Atrial fibrillation ([place])   . Appendicitis     SBP 7/09 ruputre w/ RLQ abscess 03/16/08  . Hyperlipidemia   . BPH (benign prostatic hyperplasia) 06/29/2011  . Small bowel obstruction ([place])   . Knee fracture, right 08/2014  . Basal cell carcinoma of nose 2016    Right side nasal tip, [name]    Past Surgical History  Procedure Laterality Date  . Hemorrhoid surgery    . Tee/dccv  02/07/07  . Ep study  09/27/07    see pmh  . Appendectomy      exploratory, ruptured appendix, RLQ abscess  . Polypectomy    . Colonoscopy    . Bcc nose surgery      Social History   Social History  . Marital Status: Married    Spouse Name: N/A  .  Number of Children: 3  . Years of Education: N/A   Occupational History  . Cabinetmaker     works part time   Social History Main Topics  . Smoking status: Former Smoker    Quit date: 06/30/1971  . Smokeless tobacco: Never Used     Comment: quit tobacco remotely , ~ 1 ppd  . Alcohol Use: No  . Drug Use: No  . Sexual Activity: Not on file   Other Topics Concern  . Not on file   Social History Narrative   Household-- pt and wife (wife developing dementia)   One son lives in [place], goes there from time to time                  Medication List       This list is accurate as of: 12/02/15 11:59 PM.  Always use your most recent med list.               amiodarone 200 MG tablet  Commonly known as:  PACERONE  Take 0.5 tablets (100 mg total) by mouth daily.     aspirin 81 MG tablet  Take 81 mg by mouth daily.     azelastine 0.1 % nasal spray  Commonly known as:  ASTELIN  Place 2 sprays  into both nostrils at bedtime as needed for rhinitis. Use in each nostril as directed     bisoprolol 5 MG tablet  Commonly known as:  ZEBETA  Take 1/2 tablet in the morning and 1 tablet in the evening.     CENTRUM tablet  Take 1 tablet by mouth daily.     dorzolamide 2 % ophthalmic solution  Commonly known as:  TRUSOPT  Place 1 drop into both eyes 2 (two) times daily.     losartan-hydrochlorothiazide 100-25 MG tablet  Commonly known as:  HYZAAR  Take 1 tablet by mouth daily.     methimazole 5 MG tablet  Commonly known as:  TAPAZOLE  Take 5 mg by mouth 2 (two) times daily.           Objective:   Physical Exam BP 110/68 mmHg  Pulse 98  Temp(Src) 98.1 F (36.7 C) (Oral)  Ht 6\' 3"  (1.905 m)  Wt 226 lb (102.513 kg)  BMI 28.25 kg/m2  SpO2 97% General:   Well developed, well nourished . NAD.  HEENT:  Normocephalic . Face symmetric, atraumatic Nose: Quite congested, sinuses no TTP. TMs: Left normal, right obscured by wax Throat: Normal rate, symmetric. Lungs:    CTA B Normal respiratory effort, no intercostal retractions, no accessory muscle use. Heart: RRR,  no murmur.  No pretibial edema bilaterally  Skin: Not pale. Not jaundice Neurologic:  alert & oriented X3.  Speech normal, gait appropriate for age and unassisted Psych--  Cognition and judgment appear intact.  Cooperative with normal attention span and concentration.  Behavior appropriate. No anxious or depressed appearing.      Assessment & Plan:   Assessment> HTN Hyperlipidemia: Has consistently declined to take medications, not checking FLPs BPH CV: --CHF, Cardiomyopathy due to tachycardia: resolved  --h/o A. Fib: declined  Anticoagulation. 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 Hyperthyroidism: Saw endocrinology, started Tapazole, follow-up labs scheduled for 12/11/2015; seems to be tolerating the medication well. Atrial fibrillation: On amiodarone which is a likely cause of hypothyroidism,message sent to cards 11-24-15 regards possible change therapy   Upper respiratory symptoms: Allergies versus URI. Conservative treatment. See instructions. RTC 3 months.

## 2015-12-02 NOTE — Patient Instructions (Signed)
Rest, fluids , tylenol  For cough:  Take Mucinex DM twice a day as needed until better  For nasal congestion: Use OTC Nasocort or Flonase : 2 nasal sprays on each side of the nose in the morning until you feel better Use ASTELIN a prescribed spray : 2 nasal sprays on each side of the nose at night until you feel better   Avoid decongestants such as  Pseudoephedrine or phenylephrine     Call if not gradually better over the next  10 days  Call anytime if the symptoms are severe  Next  visit in 3 months, please make an appointment at the front desk

## 2015-12-02 NOTE — Progress Notes (Signed)
Pre visit review using our clinic review tool, if applicable. No additional management support is needed unless otherwise documented below in the visit note. 

## 2015-12-04 ENCOUNTER — Other Ambulatory Visit: Payer: Self-pay | Admitting: Internal Medicine

## 2015-12-07 NOTE — Telephone Encounter (Signed)
Now under the care of endocrinology, will forward request to them

## 2015-12-07 NOTE — Telephone Encounter (Addendum)
Pt requesting refill on Methimazole. Please advise.

## 2015-12-08 DIAGNOSIS — E876 Hypokalemia: Secondary | ICD-10-CM | POA: Diagnosis not present

## 2015-12-08 DIAGNOSIS — I361 Nonrheumatic tricuspid (valve) insufficiency: Secondary | ICD-10-CM | POA: Diagnosis not present

## 2015-12-08 DIAGNOSIS — I348 Other nonrheumatic mitral valve disorders: Secondary | ICD-10-CM | POA: Diagnosis not present

## 2015-12-08 DIAGNOSIS — H409 Unspecified glaucoma: Secondary | ICD-10-CM | POA: Diagnosis present

## 2015-12-08 DIAGNOSIS — I10 Essential (primary) hypertension: Secondary | ICD-10-CM | POA: Diagnosis not present

## 2015-12-08 DIAGNOSIS — K219 Gastro-esophageal reflux disease without esophagitis: Secondary | ICD-10-CM | POA: Diagnosis present

## 2015-12-08 DIAGNOSIS — I480 Paroxysmal atrial fibrillation: Secondary | ICD-10-CM | POA: Diagnosis not present

## 2015-12-08 DIAGNOSIS — I517 Cardiomegaly: Secondary | ICD-10-CM | POA: Diagnosis not present

## 2015-12-08 DIAGNOSIS — I4891 Unspecified atrial fibrillation: Secondary | ICD-10-CM | POA: Diagnosis not present

## 2015-12-08 DIAGNOSIS — I4510 Unspecified right bundle-branch block: Secondary | ICD-10-CM | POA: Diagnosis not present

## 2015-12-08 DIAGNOSIS — E0590 Thyrotoxicosis, unspecified without thyrotoxic crisis or storm: Secondary | ICD-10-CM | POA: Diagnosis not present

## 2015-12-08 DIAGNOSIS — R079 Chest pain, unspecified: Secondary | ICD-10-CM | POA: Diagnosis not present

## 2015-12-08 DIAGNOSIS — Z87891 Personal history of nicotine dependence: Secondary | ICD-10-CM | POA: Diagnosis not present

## 2015-12-08 DIAGNOSIS — I444 Left anterior fascicular block: Secondary | ICD-10-CM | POA: Diagnosis not present

## 2015-12-08 DIAGNOSIS — I358 Other nonrheumatic aortic valve disorders: Secondary | ICD-10-CM | POA: Diagnosis not present

## 2015-12-08 DIAGNOSIS — R55 Syncope and collapse: Secondary | ICD-10-CM | POA: Diagnosis not present

## 2015-12-08 DIAGNOSIS — R9431 Abnormal electrocardiogram [ECG] [EKG]: Secondary | ICD-10-CM | POA: Diagnosis not present

## 2015-12-08 DIAGNOSIS — I221 Subsequent ST elevation (STEMI) myocardial infarction of inferior wall: Secondary | ICD-10-CM | POA: Diagnosis not present

## 2015-12-08 DIAGNOSIS — Z7901 Long term (current) use of anticoagulants: Secondary | ICD-10-CM | POA: Diagnosis not present

## 2015-12-08 DIAGNOSIS — Z809 Family history of malignant neoplasm, unspecified: Secondary | ICD-10-CM | POA: Diagnosis not present

## 2015-12-08 DIAGNOSIS — I499 Cardiac arrhythmia, unspecified: Secondary | ICD-10-CM | POA: Diagnosis not present

## 2015-12-08 NOTE — Assessment & Plan Note (Signed)
Diarrhea: Stool test negative, likely related to hyperthyroidism. Hyperthyroidism: Saw endocrinology, started Tapazole, follow-up labs scheduled for 12/11/2015; seems to be tolerating the medication well. Atrial fibrillation: On amiodarone which is a likely cause of hypothyroidism,message sent to cards 11-24-15 regards possible change therapy   Upper respiratory symptoms: Allergies versus URI. Conservative treatment. See instructions. RTC 3 months.

## 2015-12-11 ENCOUNTER — Ambulatory Visit: Payer: Medicare Other | Admitting: Endocrinology

## 2015-12-14 ENCOUNTER — Other Ambulatory Visit: Payer: Medicare Other

## 2015-12-14 ENCOUNTER — Other Ambulatory Visit: Payer: Self-pay | Admitting: *Deleted

## 2015-12-14 DIAGNOSIS — T462X5A Adverse effect of other antidysrhythmic drugs, initial encounter: Secondary | ICD-10-CM

## 2015-12-14 DIAGNOSIS — E0580 Other thyrotoxicosis without thyrotoxic crisis or storm: Secondary | ICD-10-CM

## 2015-12-15 DIAGNOSIS — L02212 Cutaneous abscess of back [any part, except buttock]: Secondary | ICD-10-CM | POA: Diagnosis not present

## 2015-12-15 DIAGNOSIS — L723 Sebaceous cyst: Secondary | ICD-10-CM | POA: Diagnosis not present

## 2015-12-17 ENCOUNTER — Telehealth: Payer: Self-pay | Admitting: Endocrinology

## 2015-12-17 ENCOUNTER — Encounter: Payer: Self-pay | Admitting: *Deleted

## 2015-12-17 ENCOUNTER — Ambulatory Visit: Payer: Medicare Other | Admitting: Endocrinology

## 2015-12-17 DIAGNOSIS — Z0289 Encounter for other administrative examinations: Secondary | ICD-10-CM

## 2015-12-17 NOTE — Telephone Encounter (Signed)
Letter mailed

## 2015-12-17 NOTE — Telephone Encounter (Signed)
Patient no showed today's appt. Please advise on how to follow up. °A. No follow up necessary. °B. Follow up urgent. Contact patient immediately. °C. Follow up necessary. Contact patient and schedule visit in ___ days. °D. Follow up advised. Contact patient and schedule visit in ____weeks. ° °

## 2015-12-17 NOTE — Telephone Encounter (Signed)
Follow up advised. Contact patient and schedule visit in 2 weeks. 

## 2015-12-18 DIAGNOSIS — E876 Hypokalemia: Secondary | ICD-10-CM | POA: Diagnosis not present

## 2015-12-18 DIAGNOSIS — I4891 Unspecified atrial fibrillation: Secondary | ICD-10-CM | POA: Diagnosis not present

## 2015-12-21 ENCOUNTER — Telehealth: Payer: Self-pay | Admitting: Internal Medicine

## 2015-12-21 NOTE — Telephone Encounter (Signed)
Caller name:Temple,Dee Relation to [id number]  Call back number:[phone] Pharmacy: [facility] [address], [place], [place] [address] [phone]     Reason for call:  requesting a refill methimazole (TAPAZOLE) 5 MG tablet

## 2015-12-21 NOTE — Telephone Encounter (Signed)
He did not show up for his appointment, needs to reschedule; can refill enough until his next appointment

## 2015-12-21 NOTE — Telephone Encounter (Signed)
Will forward to endocrinology

## 2015-12-21 NOTE — Telephone Encounter (Signed)
Please advise. This medication was originally prescribed by you 11/19/15, but pt has seen endo ([name]) since then. Ok to refill or does he need follow up labs?

## 2015-12-22 DIAGNOSIS — L02212 Cutaneous abscess of back [any part, except buttock]: Secondary | ICD-10-CM | POA: Diagnosis not present

## 2015-12-22 DIAGNOSIS — B958 Unspecified staphylococcus as the cause of diseases classified elsewhere: Secondary | ICD-10-CM | POA: Diagnosis not present

## 2015-12-22 NOTE — Telephone Encounter (Signed)
I spoke with Mr. Pantoja daughter, she said her father has been in the hospital in A-fib and that's why he missed his appointment on the 27th. She did make an appointment for him for 05/08 @ 10 am.  14 tablets of methimazole was called into the pharmacy.

## 2015-12-22 NOTE — Telephone Encounter (Signed)
I am unable to reach patients daughter on call back.

## 2015-12-25 ENCOUNTER — Telehealth: Payer: Self-pay | Admitting: Internal Medicine

## 2015-12-25 NOTE — Telephone Encounter (Signed)
Received a history and physical from [facility] in [place] , [place], [place], cardiology: The patient was admitted to the hospital with palpitations, weakness, syncope, A. fib with RVR. Dr. Truman Hayward saw the patient, the H&P states that they were planning to discontinue amiodarone and attempt at cardioversion 12/10/2015. There was extensive discussion about anticoagulation. I don't have a discharge summary. I called the patient to check on him, could not reach the patient to the 2 listed numbers. We'll try again next week.

## 2015-12-28 ENCOUNTER — Ambulatory Visit (INDEPENDENT_AMBULATORY_CARE_PROVIDER_SITE_OTHER): Payer: Medicare Other | Admitting: Cardiology

## 2015-12-28 ENCOUNTER — Encounter: Payer: Self-pay | Admitting: Endocrinology

## 2015-12-28 ENCOUNTER — Encounter: Payer: Self-pay | Admitting: Cardiology

## 2015-12-28 ENCOUNTER — Ambulatory Visit (INDEPENDENT_AMBULATORY_CARE_PROVIDER_SITE_OTHER): Payer: Medicare Other | Admitting: Endocrinology

## 2015-12-28 ENCOUNTER — Telehealth: Payer: Self-pay | Admitting: Endocrinology

## 2015-12-28 VITALS — BP 90/64 | HR 84 | Ht 75.0 in | Wt 232.0 lb

## 2015-12-28 VITALS — BP 128/78 | HR 83 | Ht 75.0 in | Wt 230.0 lb

## 2015-12-28 DIAGNOSIS — I4891 Unspecified atrial fibrillation: Secondary | ICD-10-CM | POA: Diagnosis not present

## 2015-12-28 DIAGNOSIS — E0580 Other thyrotoxicosis without thyrotoxic crisis or storm: Secondary | ICD-10-CM | POA: Diagnosis not present

## 2015-12-28 DIAGNOSIS — T462X5A Adverse effect of other antidysrhythmic drugs, initial encounter: Secondary | ICD-10-CM

## 2015-12-28 LAB — TSH: TSH: 0.17 u[IU]/mL — AB (ref 0.35–4.50)

## 2015-12-28 LAB — T3, FREE: T3, Free: 3.8 pg/mL (ref 2.3–4.2)

## 2015-12-28 LAB — T4, FREE: Free T4: 1.37 ng/dL (ref 0.60–1.60)

## 2015-12-28 MED ORDER — METHIMAZOLE 10 MG PO TABS: 10.0000 mg | ORAL_TABLET | Freq: Two times a day (BID) | ORAL | Status: DC

## 2015-12-28 NOTE — Progress Notes (Signed)
12/28/2015 Antonio [name]   [birth year]  [id number]  Primary Physician Kathlene November, MD Primary Cardiologist: Dr. Caryl [name]   Reason for Visit/CC: [facility] F/u for Atrial Fibrillation (outside hospital in [facility])  HPI:  The patient is a 82 y/o male, followed by Dr. Caryl [name]. He has a h/o PAF associated with a tachycardia-induced cardiomyopathy which has now normalized. He was on Amiodarone, but was recently discontinued due to thyroid toxicity (see below).Dr. Caryl [name] had discussed anticoagulation on a number of occasions, however he had repeatedly declined and was only taking ASA, until recently. Also with a h/o HTN and HLD. No h/o CAD.  He presents to clinic today for post hospital follow-up. He is accompanied by both his wife and daughter. He was recently treated at an outside hospital in [facility]. He resides in [place] with but was in [place] visiting his daughter during the Easter holiday. On 12/08/2015, he had a syncopal episode and was evaluated at [facility] There, he was found to be in atrial fibrillation with rapid ventricular response of 160 bpm. He was also discovered to be hyperthyroid. Subsequently, his amiodarone was discontinued and he was placed on methimazole. Per discharge summary, he ruled out for MI with negative enzymes. Plans were made for direct current cardioversion, however a TEE was performed which showed a left atrial thrombus. Left ventricular ejection fraction was normal at 55-60%. There were no significant valve abnormalities. Due to visualization of the left atrial thrombus, his cardioversion was canceled. He was placed on anticoagulation therapy with Eliquis, 5 mg twice a day. He was treated with rate control agents, Cardizem 120 mg in addition to Metroprolol 12.5 mg twice a day. He was instructed to continue aspirin along with Eliquis.  Today in clinic he reports that he has done well since discharge. He denies any more syncope and no  near syncope. He denies any palpitations. His only complaint is fatigue and mild dyspnea with exertion. He denies any resting dyspnea. No chest pain. No edema or orthopnea. No falls. He reports full medication compliance with Eliquis. He denies any abnormal bleeding. He has also been fully  compliant with his Cardizem and Metroprolol. He is now being followed by endocrinologist for hyperthyroidism. EKG today shows continued atrial fibrillation but with a CVR in the 80s. BP is stable.     Current Outpatient Prescriptions  Medication Sig Dispense Refill  . apixaban (ELIQUIS) 5 MG TABS tablet Take 5 mg by mouth 2 (two) times daily.    Marland Kitchen diltiazem (CARDIZEM CD) 120 MG 24 hr capsule Take 120 mg by mouth daily.    . dorzolamide (TRUSOPT) 2 % ophthalmic solution Place 1 drop into both eyes 2 (two) times daily.      . methimazole (TAPAZOLE) 10 MG tablet Take 1 tablet (10 mg total) by mouth 2 (two) times daily. 60 tablet 2  . metoprolol tartrate (LOPRESSOR) 25 MG tablet Take 12.5 mg by mouth 2 (two) times daily.    . Multiple Vitamins-Minerals (CENTRUM) tablet Take 1 tablet by mouth daily.       No current facility-administered medications for this visit.    No Known Allergies  Social History   Social History  . Marital Status: Married    Spouse Name: N/A  . Number of Children: 3  . Years of Education: N/A   Occupational History  . Cabinetmaker     works part time   Social History Main Topics  . Smoking status: Former Smoker  Quit date: 06/30/1971  . Smokeless tobacco: Never Used     Comment: quit tobacco remotely , ~ 1 ppd  . Alcohol Use: No  . Drug Use: No  . Sexual Activity: Not on file   Other Topics Concern  . Not on file   Social History Narrative   Household-- pt and wife (wife developing dementia)   One son lives in [place], goes there from time to time               Review of Systems: General: negative for chills, fever, night sweats or weight changes.    Cardiovascular: negative for chest pain, dyspnea on exertion, edema, orthopnea, palpitations, paroxysmal nocturnal dyspnea or shortness of breath Dermatological: negative for rash Respiratory: negative for cough or wheezing Urologic: negative for hematuria Abdominal: negative for nausea, vomiting, diarrhea, bright red blood per rectum, melena, or hematemesis Neurologic: negative for visual changes, syncope, or dizziness All other systems reviewed and are otherwise negative except as noted above.    Blood pressure 90/64, pulse 84, height 6\' 3"  (1.905 m), weight 232 lb (105.235 kg).  General appearance: alert, cooperative and no distress Neck: no carotid bruit and no JVD Lungs: clear to auscultation bilaterally Heart: irregularly irregular rhythm and regular rate Extremities: no LEE Pulses: 2+ and symmetric Skin: warm and dry Neurologic: Grossly normal  EKG Atrial fibrillation with a CVR 88 bpm   ASSESSMENT AND PLAN:   1. Atrial Fibrillation: Rate is well-controlled in clinic today in the 80s. He is to continue rate control therapy with Cardizem and Metroprolol. He is also to resume Eliquis for anticoagulation as well as for left atrial thrombus. Given he does not have history of CAD, we will discontinue his aspirin to minimize bleed risks. Given he was recently found a left a left atrial thrombus on recent TEE, he will require a repeat TEE prior to undergoing re-attempt at direct-current cardioversion. He will likely also require initiation of a new antiarrhythmic therapy drug to maintain sinus rhythm following his cardioversion. Unfortunately, he is no longer a candidate for amiodarone given the development of amiodarone toxicity. I recommend that we wait another 4 weeks before we set him up TEE cardioversion. Before that, I would like for him to follow-up either with Dr. Caryl [name], his primary electrophysiologist, or the [facility] in 4 weeks for repeat assessment. A decision will be made at  that time as to what antiarrhythmic drugs should be selected. Repeat EKG will also be performed to see if he is still in atrial fibrillation. Patient was provided samples of Eliquis in clinic today.  2. 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 Will add to his allergy list. He his being followed by endocrinology and is being treated with methimazole.  PLAN  F/u with Dr. Caryl [name] or [facility] in 4 weeks.   MICHA, VANVLIET PA-C 12/28/2015 3:17 PM

## 2015-12-28 NOTE — Progress Notes (Signed)
Quick Note:  Please let patient know that the thyroid level is back to normal, continue methimazole 10 mg twice a day, please change prescription and keep scheduled appointment for follow-up ______

## 2015-12-28 NOTE — Patient Instructions (Signed)
Medication Instructions:  1) STOP Aspirin  Labwork: None  Testing/Procedures: None  Follow-Up: Your physician recommends that you schedule a follow-up appointment in: 4 weeks with [name] or [name], NP at our [facility].   Any Other Special Instructions Will Be Listed Below (If Applicable).     If you need a refill on your cardiac medications before your next appointment, please call your pharmacy.

## 2015-12-28 NOTE — Progress Notes (Signed)
Patient ID: Antonio [name], male   DOB: January 22, 1929, 82 y.o.   MRN: [id number]                                                                                                               Reason for Appointment:  Hyperthyroidism, follow-up  Referring physician: Larose Kells   History of Present Illness:   He was seen in 3/17 with the following clinical picture: For 3 months the patient  had symptoms of decreased appetite, progressive weight loss, tiredness and weakness No usual complaints of any palpitations, shakiness, feeling excessively warm and sweaty or any nervousness He had been on amiodarone since his atrial fibrillation was diagnosed about 7 years ago  Wt Readings from Last 3 Encounters:  12/28/15 230 lb (104.327 kg)  12/02/15 226 lb (102.513 kg)  11/20/15 228 lb 9.6 oz (103.692 kg)    When evaluated with thyroid function tests they showed the following:     Lab Results  Component Value Date   FREET4 3.08* 10/30/2015   FREET4 0.73 06/28/2010   FREET4 0.8 10/15/2009   T3FREE 6.7* 10/30/2015   T3FREE 2.6 06/28/2010   T3FREE 2.6 10/15/2009   TSH 0.07* 10/30/2015   TSH 0.132* 09/23/2015   TSH 3.51 03/20/2015     He was started on methimazole 10 mg twice a day in 3/17 but he did not come back for follow-up since he was admitted to the hospital in [place] in mid-April He does think he is eating a little better and his diarrhea has resolved Weight is up couple of pounds. Still having issues with his atrial fibrillation but has been taken off amiodarone by the physician in [place] During his admission in 4/17 his free T4 level was about 4.0      Medication List       This list is accurate as of: 12/28/15 11:25 AM.  Always use your most recent med list.               aspirin 81 MG tablet  Take 81 mg by mouth daily.     CENTRUM tablet  Take 1 tablet by mouth daily.     diltiazem 120 MG 24 hr capsule  Commonly known as:  CARDIZEM CD  Take 120 mg by mouth  daily.     dorzolamide 2 % ophthalmic solution  Commonly known as:  TRUSOPT  Place 1 drop into both eyes 2 (two) times daily.     ELIQUIS 5 MG Tabs tablet  Generic drug:  apixaban  Take 5 mg by mouth 2 (two) times daily.     methimazole 5 MG tablet  Commonly known as:  TAPAZOLE  Take 5 mg by mouth 2 (two) times daily.     metoprolol tartrate 25 MG tablet  Commonly known as:  LOPRESSOR  Take 12.5 mg by mouth 2 (two) times daily.            Past Medical History  Diagnosis Date  . Hypertension   . Cardiomyopathy  secondary to Tachycardia. A-Acute on chronic admit for CHF 6/08. B- CHF coupled with atrial fib RVR electrophysiology study 09/27/07.  A- Multiple flutters and Atrial fib- no ablation performed b- ibutilide cardoversion c- amiordarone load- d/c in sinus rhyrthm d- coumadin anticoagulation- stopped at time of GI bleed 11/09  . Mallory - Weiss tear     esophagitis, 6 unit hemorrhage- EPI injections   . Glaucoma   . Atrial fibrillation ([place])   . Appendicitis     SBP 7/09 ruputre w/ RLQ abscess 03/16/08  . Hyperlipidemia   . BPH (benign prostatic hyperplasia) 06/29/2011  . Small bowel obstruction ([place])   . Knee fracture, right 08/2014  . Basal cell carcinoma of nose 2016    Right side nasal tip, [name]    Past Surgical History  Procedure Laterality Date  . Hemorrhoid surgery    . Tee/dccv  02/07/07  . Ep study  09/27/07    see pmh  . Appendectomy      exploratory, ruptured appendix, RLQ abscess  . Polypectomy    . Colonoscopy    . Bcc nose surgery      Family History  Problem Relation Age of Onset  . Uterine cancer Mother   . Colon cancer Neg Hx   . Prostate cancer Neg Hx   . Diabetes Neg Hx   . Heart attack Maternal Uncle   . Heart disease Maternal Uncle     Social History:  reports that he quit smoking about 44 years ago. He has never used smokeless tobacco. He reports that he does not drink alcohol or use illicit drugs.  Allergies: No Known  Allergies   Review of Systems    Examination:   BP 128/78 mmHg  Pulse 83  Ht 6\' 3"  (1.905 m)  Wt 230 lb (104.327 kg)  BMI 28.75 kg/m2  SpO2 97%  He looks well, pleasant Neck: The thyroid is palpable on swallowing, about twice normal on the right and less prominent on the left, firm and smooth  Heartrate about 100, irregular Deep tendon reflexes at biceps are slightly brisk No fine tremors are present.   Assessment/Plan:   Hyperthyroidism, from amiodarone along with underlying small multinodular goiter   He has been on methimazole but continues to have symptoms of fatigue but has not lost any weight recently Has relatively fast heart rate again His methimazole was not changed when he was evaluated last month in [place] during his admission when his thyroid levels are still high Will check his thyroid levels again today and most likely needs a higher dose of methimazole Discussed with him and his family that the effect of amiodarone is probably going to persist for some time and will need to continue methimazole until he is in remission   Liandra Mendia 12/28/2015, 11:25 AM

## 2015-12-28 NOTE — Telephone Encounter (Signed)
Rx submitted

## 2015-12-28 NOTE — Telephone Encounter (Signed)
Pt needs methimazole please call to [facility]

## 2015-12-29 NOTE — Telephone Encounter (Signed)
Was seen by cardiology

## 2015-12-30 ENCOUNTER — Other Ambulatory Visit: Payer: Self-pay | Admitting: *Deleted

## 2015-12-30 ENCOUNTER — Ambulatory Visit: Payer: Medicare Other | Admitting: Internal Medicine

## 2015-12-30 MED ORDER — METHIMAZOLE 10 MG PO TABS: 10.0000 mg | ORAL_TABLET | Freq: Two times a day (BID) | ORAL | Status: DC

## 2016-01-12 DIAGNOSIS — L02212 Cutaneous abscess of back [any part, except buttock]: Secondary | ICD-10-CM | POA: Diagnosis not present

## 2016-01-12 DIAGNOSIS — B958 Unspecified staphylococcus as the cause of diseases classified elsewhere: Secondary | ICD-10-CM | POA: Diagnosis not present

## 2016-01-21 ENCOUNTER — Ambulatory Visit: Payer: Medicare Other | Admitting: Endocrinology

## 2016-01-27 ENCOUNTER — Encounter: Payer: Self-pay | Admitting: Endocrinology

## 2016-01-27 ENCOUNTER — Ambulatory Visit (INDEPENDENT_AMBULATORY_CARE_PROVIDER_SITE_OTHER): Payer: Medicare Other | Admitting: Endocrinology

## 2016-01-27 VITALS — BP 118/82 | HR 90 | Ht 75.0 in

## 2016-01-27 DIAGNOSIS — E0580 Other thyrotoxicosis without thyrotoxic crisis or storm: Secondary | ICD-10-CM

## 2016-01-27 LAB — T4, FREE: FREE T4: 0.72 ng/dL (ref 0.60–1.60)

## 2016-01-27 LAB — TSH: TSH: 0.31 u[IU]/mL — AB (ref 0.35–4.50)

## 2016-01-27 LAB — T3, FREE: T3, Free: 3.2 pg/mL (ref 2.3–4.2)

## 2016-01-27 NOTE — Progress Notes (Signed)
Patient ID: Antonio [name], male   DOB: Sep 23, 1928, 82 y.o.   MRN: [id number]                                                                                                               Reason for Appointment:  Hyperthyroidism, follow-up  Referring physician: Larose Kells   History of Present Illness:   He was seen in 3/17 with the following clinical picture: For 3 months the patient  had symptoms of decreased appetite, progressive weight loss, tiredness and weakness No usual complaints of any palpitations, shakiness, feeling excessively warm and sweaty or any nervousness He had been on amiodarone since his atrial fibrillation was diagnosed about 7 years ago  Wt Readings from Last 3 Encounters:  12/28/15 232 lb (105.235 kg)  12/28/15 230 lb (104.327 kg)  12/02/15 226 lb (102.513 kg)    When evaluated with thyroid function tests they showed the following:     Lab Results  Component Value Date   FREET4 1.37 12/28/2015   FREET4 3.08* 10/30/2015   FREET4 0.73 06/28/2010   T3FREE 3.8 12/28/2015   T3FREE 6.7* 10/30/2015   T3FREE 2.6 06/28/2010   TSH 0.17* 12/28/2015   TSH 0.07* 10/30/2015   TSH 0.132* 09/23/2015     He was started on methimazole 10 mg twice a day in 3/17 but he did not come back for follow-up Until after his hospitalization in April  He does not complain of any unusual fatigue or shortness of breath, appetite is normal Weight is up couple of pounds. Still having issues with his atrial fibrillation and may be getting cardioversion  His methimazole dose was not changed in 5/17     Medication List       This list is accurate as of: 01/27/16 10:52 AM.  Always use your most recent med list.               CENTRUM tablet  Take 1 tablet by mouth daily.     diltiazem 120 MG 24 hr capsule  Commonly known as:  CARDIZEM CD  Take 120 mg by mouth daily.     dorzolamide 2 % ophthalmic solution  Commonly known as:  TRUSOPT  Place 1 drop into both eyes 2 (two) times  daily.     ELIQUIS 5 MG Tabs tablet  Generic drug:  apixaban  Take 5 mg by mouth 2 (two) times daily.     methimazole 10 MG tablet  Commonly known as:  TAPAZOLE  Take 1 tablet (10 mg total) by mouth 2 (two) times daily.     metoprolol tartrate 25 MG tablet  Commonly known as:  LOPRESSOR  Take 12.5 mg by mouth 2 (two) times daily.            Past Medical History  Diagnosis Date  . Hypertension   . Cardiomyopathy     secondary to Tachycardia. A-Acute on chronic admit for CHF 6/08. B- CHF coupled with atrial fib RVR electrophysiology study 09/27/07.  A- Multiple flutters and Atrial  fib- no ablation performed b- ibutilide cardoversion c- amiordarone load- d/c in sinus rhyrthm d- coumadin anticoagulation- stopped at time of GI bleed 11/09  . [place]ory - Weiss tear     esophagitis, 6 unit hemorrhage- EPI injections   . Glaucoma   . Atrial fibrillation ([place])   . Appendicitis     SBP 7/09 ruputre w/ RLQ abscess 03/16/08  . Hyperlipidemia   . BPH (benign prostatic hyperplasia) 06/29/2011  . Small bowel obstruction ([place])   . Knee fracture, right 08/2014  . Basal cell carcinoma of nose 2016    Right side nasal tip, [name]    Past Surgical History  Procedure Laterality Date  . Hemorrhoid surgery    . Tee/dccv  02/07/07  . Ep study  09/27/07    see pmh  . Appendectomy      exploratory, ruptured appendix, RLQ abscess  . Polypectomy    . Colonoscopy    . Bcc nose surgery      Family History  Problem Relation Age of Onset  . Uterine cancer Mother   . Colon cancer Neg Hx   . Prostate cancer Neg Hx   . Diabetes Neg Hx   . Heart attack Maternal Uncle   . Heart disease Maternal Uncle     Social History:  reports that he quit smoking about 44 years ago. He has never used smokeless tobacco. He reports that he does not drink alcohol or use illicit drugs.  Allergies:  Allergies  Allergen Reactions  . Amiodarone Other (See Comments)    Thyroid toxicity     Review of  Systems    Examination:   BP 118/82 mmHg  Pulse 90  Ht 6\' 3"  (1.905 m)  SpO2 95%  He looks well, pleasant Neck: The thyroid is palpable on swallowing, about twice normal on the right and minimal  on the left, firm and smooth  Heart rate about 90, irregular Deep tendon reflexes at biceps are normal No fine tremors are present.   Assessment/Plan:   Hyperthyroidism, from amiodarone along with underlying small multinodular goiter   He has had improvement in his thyroid levels with methimazole as of his last visit Subjectively doing well today Continues to have some signs of a multinodular goiter   Since he is off his amiodarone it may be easier to control his hyperthyroidism To have thyroid levels checked today to adjust his dose as needed   Miko Markwood 01/27/2016, 10:52 AM

## 2016-01-27 NOTE — Progress Notes (Signed)
Quick Note:  Please let patient know that the thyroid level is improved, reduce methimazole to 1 tablet really instead of twice a day, follow-up in one month again ______

## 2016-01-28 ENCOUNTER — Ambulatory Visit (HOSPITAL_COMMUNITY)
Admission: RE | Admit: 2016-01-28 | Discharge: 2016-01-28 | Disposition: A | Discharge status: Home / Self Care | Payer: Medicare Other | Source: Ambulatory Visit | Attending: Nurse Practitioner | Admitting: Nurse Practitioner

## 2016-01-28 ENCOUNTER — Encounter (HOSPITAL_COMMUNITY): Payer: Self-pay | Admitting: Nurse Practitioner

## 2016-01-28 VITALS — BP 118/72 | HR 105 | Ht 75.0 in | Wt 238.0 lb

## 2016-01-28 DIAGNOSIS — Z79899 Other long term (current) drug therapy: Secondary | ICD-10-CM | POA: Insufficient documentation

## 2016-01-28 DIAGNOSIS — Z87891 Personal history of nicotine dependence: Secondary | ICD-10-CM | POA: Insufficient documentation

## 2016-01-28 DIAGNOSIS — Z888 Allergy status to other drugs, medicaments and biological substances status: Secondary | ICD-10-CM | POA: Insufficient documentation

## 2016-01-28 DIAGNOSIS — Z86718 Personal history of other venous thrombosis and embolism: Secondary | ICD-10-CM | POA: Diagnosis not present

## 2016-01-28 DIAGNOSIS — Z7901 Long term (current) use of anticoagulants: Secondary | ICD-10-CM | POA: Diagnosis not present

## 2016-01-28 DIAGNOSIS — I4891 Unspecified atrial fibrillation: Secondary | ICD-10-CM | POA: Insufficient documentation

## 2016-01-28 DIAGNOSIS — E0590 Thyrotoxicosis, unspecified without thyrotoxic crisis or storm: Secondary | ICD-10-CM | POA: Insufficient documentation

## 2016-01-28 DIAGNOSIS — I429 Cardiomyopathy, unspecified: Secondary | ICD-10-CM | POA: Diagnosis not present

## 2016-01-28 DIAGNOSIS — I10 Essential (primary) hypertension: Secondary | ICD-10-CM | POA: Insufficient documentation

## 2016-01-28 DIAGNOSIS — H409 Unspecified glaucoma: Secondary | ICD-10-CM | POA: Insufficient documentation

## 2016-01-28 DIAGNOSIS — Z85828 Personal history of other malignant neoplasm of skin: Secondary | ICD-10-CM | POA: Diagnosis not present

## 2016-01-28 LAB — BASIC METABOLIC PANEL
Anion gap: 8 (ref 5–15)
BUN: 16 mg/dL (ref 6–20)
CO2: 22 mmol/L (ref 22–32)
Calcium: 9.8 mg/dL (ref 8.9–10.3)
Chloride: 107 mmol/L (ref 101–111)
Creatinine, Ser: 1.15 mg/dL (ref 0.61–1.24)
GFR calc Af Amer: >60 mL/min (ref >60)
GFR, EST NON AFRICAN AMERICAN: 57 mL/min — AB (ref >60)
Glucose, Bld: 104 mg/dL — ABNORMAL HIGH (ref 65–99)
POTASSIUM: 4.7 mmol/L (ref 3.5–5.1)
SODIUM: 137 mmol/L (ref 135–145)

## 2016-01-28 LAB — CBC
HCT: 42.1 % (ref 39.0–52.0)
HEMOGLOBIN: 13.7 g/dL (ref 13.0–17.0)
MCH: 29 pg (ref 26.0–34.0)
MCHC: 32.5 g/dL (ref 30.0–36.0)
MCV: 89 fL (ref 78.0–100.0)
Platelets: 231 10*3/uL (ref 150–400)
RBC: 4.73 MIL/uL (ref 4.22–5.81)
RDW: 15.2 % (ref 11.5–15.5)
WBC: 6.9 10*3/uL (ref 4.0–10.5)

## 2016-01-28 LAB — MAGNESIUM: MAGNESIUM: 2.1 mg/dL (ref 1.7–2.4)

## 2016-01-29 ENCOUNTER — Encounter (HOSPITAL_COMMUNITY): Payer: Self-pay | Admitting: Nurse Practitioner

## 2016-01-29 LAB — AMIODARONE LEVEL
Amiodarone Lvl: 0.3 ug/mL — ABNORMAL LOW (ref 1.0–2.5)
N-DESETHYL-AMIODARONE: NOT DETECTED ug/mL (ref 1.0–2.5)

## 2016-01-29 NOTE — Progress Notes (Addendum)
Patient ID: Antonio [name], male   DOB: 11-29-1928, 82 y.o.   MRN: [id number]     Primary Care Physician: Kathlene November, MD Referring Physician: [name], PA Electrophysiologist: [name] Cardiologist: None   Antonio [name] is a 82 y.o. male with a h/o afib that has maintained SR on amiodarone x 7-8 years. Prior to that had [place] which normalized with return of SR.  While visiting with his daughter several weeks ago in [place], [place], he had a syncopal episode. He was hospitalized at [facility] was found to hyperthyroid with afib RVR.Marland KitchenAmiodarone was d/ced. He was off anticoagulation due to remote  GI bleed and pt's desires  not to resume blood thinners. Plans were made for direct current cardioversion, however a TEE was performed which showed a left atrial thrombus. Left ventricular ejection fraction was normal at 55-60%.Chadsvasc score is at least 3. There were no significant valve abnormalities. Due to visualization of the left atrial thrombus, his cardioversion was canceled. He was placed on anticoagulation therapy with Eliquis, 5 mg twice a day. He was treated with rate control agents, Cardizem 120 mg in addition to Metroprolol 12.5 mg twice a day. He was instructed to continue aspirin along with Eliquis.  He was sen by [name], PA 5/8 and referred here for further addressing of restoring SR with AAD's and repeat cardioversion when it is thought to be safe to resume when thyroid is normalized and thrombus dissolved. Pt did not sign up for Medicare D at 89 and currently has no drug coverage. Daughter is trying to get him signed up for drug coverage but it will not be before October. If [name] is used, cost may be a big concern, unless pt qualifies for pt assistance. QTc today is 523 ms,but RBBB is contributing.Qtc in SR around 440 ms. Last TSH drawn yesterday shows that thyroid is improving off amiodarone and on methimazole 10 mg daily. TSH 0.07 3 months ago and 6/7 0.31  and methimazole reduced to 1 tab daily by [name].   Pt is symptomatic with Afib and is c/o fatigue and shortness of breath with activity. No PND, orthopnea, appears normvolemic. Off amiodarone around 6 weeks now. Rate control is reasonable and reports HR in 80's/low 90's at home. On BB/CCB.  Today, he denies symptoms of  chest pain,  orthopnea, PND, lower extremity edema, dizziness, presyncope, syncope, or neurologic sequela. Positive for shortness of breath, palpitations, fatigue The patient is tolerating medications without difficulties and is otherwise without complaint today.   Past Medical History  Diagnosis Date  . Hypertension   . Cardiomyopathy     secondary to Tachycardia. A-Acute on chronic admit for CHF 6/08. B- CHF coupled with atrial fib RVR electrophysiology study 09/27/07.  A- Multiple flutters and Atrial fib- no ablation performed b- ibutilide cardoversion c- amiordarone load- d/c in sinus rhyrthm d- coumadin anticoagulation- stopped at time of GI bleed 11/09  . Mallory - Weiss tear     esophagitis, 6 unit hemorrhage- EPI injections   . Glaucoma   . Atrial fibrillation ([place])   . Appendicitis     SBP 7/09 ruputre w/ RLQ abscess 03/16/08  . Hyperlipidemia   . BPH (benign prostatic hyperplasia) 06/29/2011  . Small bowel obstruction ([place])   . Knee fracture, right 08/2014  . Basal cell carcinoma of nose 2016    Right side nasal tip, [name]   Past Surgical History  Procedure Laterality Date  . Hemorrhoid surgery    .  Tee/dccv  02/07/07  . Ep study  09/27/07    see pmh  . Appendectomy      exploratory, ruptured appendix, RLQ abscess  . Polypectomy    . Colonoscopy    . Bcc nose surgery      Current Outpatient Prescriptions  Medication Sig Dispense Refill  . apixaban (ELIQUIS) 5 MG TABS tablet Take 5 mg by mouth 2 (two) times daily.    Marland Kitchen diltiazem (CARDIZEM CD) 120 MG 24 hr capsule Take 120 mg by mouth daily.    . dorzolamide (TRUSOPT) 2 % ophthalmic solution Place 1  drop into both eyes 2 (two) times daily.      . methimazole (TAPAZOLE) 10 MG tablet Take 1 tablet (10 mg total) by mouth 2 (two) times daily. 60 tablet 2  . metoprolol tartrate (LOPRESSOR) 25 MG tablet Take 12.5 mg by mouth 2 (two) times daily.    . Multiple Vitamins-Minerals (CENTRUM) tablet Take 1 tablet by mouth daily.       No current facility-administered medications for this encounter.    Allergies  Allergen Reactions  . Amiodarone Other (See Comments)    Thyroid toxicity    Social History   Social History  . Marital Status: Married    Spouse Name: N/A  . Number of Children: 3  . Years of Education: N/A   Occupational History  . Cabinetmaker     works part time   Social History Main Topics  . Smoking status: Former Smoker    Quit date: 06/30/1971  . Smokeless tobacco: Never Used     Comment: quit tobacco remotely , ~ 1 ppd  . Alcohol Use: No  . Drug Use: No  . Sexual Activity: Not on file   Other Topics Concern  . Not on file   Social History Narrative   Household-- pt and wife (wife developing dementia)   One son lives in [place], goes there from time to time              Family History  Problem Relation Age of Onset  . Uterine cancer Mother   . Colon cancer Neg Hx   . Prostate cancer Neg Hx   . Diabetes Neg Hx   . Heart attack Maternal Uncle   . Heart disease Maternal Uncle     ROS- All systems are reviewed and negative except as per the HPI above  Physical Exam: Filed Vitals:   01/28/16 1356  BP: 118/72  Pulse: 105  Height: 6\' 3"  (1.905 m)  Weight: 238 lb (107.956 kg)    GEN- The patient is well appearing, alert and oriented x 3 today.   Head- normocephalic, atraumatic Eyes-  Sclera clear, conjunctiva pink Ears- hearing intact Oropharynx- clear Neck- supple, no JVP Lymph- no cervical lymphadenopathy Lungs- Clear to ausculation bilaterally, normal work of breathing Heart- Irregular rate and rhythm, no murmurs, rubs or gallops, PMI  not laterally displaced GI- soft, NT, ND, + BS Extremities- no clubbing, cyanosis, or edema MS- no significant deformity or atrophy Skin- no rash or lesion Psych- euthymic mood, full affect Neuro- strength and sensation are intact  EKG-Afib with v rate 105 bpm, LAD, RBBB, qrs int 158 ms, Qtc 523 ms  Assessment and Plan: 1.Persistent symptomatic  afib secondary to thyroid toxicity with amiodarone use Off amiodarone x 6 weeks, amiodarone level today Bmet, mag with daughter describing low K+/mag at time of hospitalization with reported diarrhea, resolved Will discuss with Dr Caryl Comes to try to restore  SR: best antiarrythmic and timing of TEE/cardioversion with prior thrombus  2. Hyperthyroidism TSH improving and almost back in normal range Continue treatment with Dr. Shearon Stalls    3. Left atrial thrombus Continue eliquis, tolerating without issues Will need repeat TEE when DCCV is rescheduled CBC today with new use of DOAC and prior GI bleed  F/u per discussion with Dr. Norma Fredrickson C. Havoc Sanluis, [facility] [address] [place], [place] [address] [phone]  Addendum: Per [name], can proceed with Tikosyn, will have TEE day of admission to hospital for loading with h/o prior LA thrombus.

## 2016-02-02 ENCOUNTER — Other Ambulatory Visit: Payer: Self-pay

## 2016-02-02 MED ORDER — APIXABAN 5 MG PO TABS: 5.0000 mg | ORAL_TABLET | Freq: Two times a day (BID) | ORAL | Status: DC

## 2016-02-03 NOTE — Addendum Note (Signed)
Encounter addended by: Sherran Needs, NP on: 02/03/2016 12:25 PM<BR>     Documentation filed: Notes Section

## 2016-02-04 ENCOUNTER — Other Ambulatory Visit (HOSPITAL_COMMUNITY): Payer: Self-pay | Admitting: *Deleted

## 2016-02-04 DIAGNOSIS — I4819 Other persistent atrial fibrillation: Secondary | ICD-10-CM

## 2016-02-08 ENCOUNTER — Ambulatory Visit (HOSPITAL_COMMUNITY)
Admission: RE | Admit: 2016-02-08 | Discharge: 2016-02-08 | Disposition: A | Discharge status: Home Health | Payer: Medicare Other | Source: Ambulatory Visit | Attending: Internal Medicine | Admitting: Internal Medicine

## 2016-02-08 ENCOUNTER — Encounter (HOSPITAL_COMMUNITY): Payer: Self-pay | Admitting: *Deleted

## 2016-02-08 ENCOUNTER — Ambulatory Visit (HOSPITAL_COMMUNITY)
Admission: RE | Admit: 2016-02-08 | Discharge: 2016-02-08 | Disposition: A | Discharge status: Home / Self Care | Payer: Medicare Other | Source: Ambulatory Visit | Attending: Internal Medicine | Admitting: Internal Medicine

## 2016-02-08 ENCOUNTER — Ambulatory Visit (HOSPITAL_COMMUNITY): Payer: Medicare Other | Admitting: Anesthesiology

## 2016-02-08 ENCOUNTER — Ambulatory Visit (HOSPITAL_BASED_OUTPATIENT_CLINIC_OR_DEPARTMENT_OTHER): Payer: Medicare Other

## 2016-02-08 ENCOUNTER — Encounter (HOSPITAL_COMMUNITY)
Admission: RE | Disposition: A | Discharge status: Home / Self Care | Payer: Self-pay | Source: Ambulatory Visit | Attending: Internal Medicine

## 2016-02-08 ENCOUNTER — Encounter (HOSPITAL_COMMUNITY)
Admission: RE | Disposition: A | Discharge status: Home Health | Payer: Self-pay | Source: Ambulatory Visit | Attending: Internal Medicine

## 2016-02-08 ENCOUNTER — Ambulatory Visit (INDEPENDENT_AMBULATORY_CARE_PROVIDER_SITE_OTHER): Payer: Medicare Other | Admitting: Pharmacist

## 2016-02-08 DIAGNOSIS — I4819 Other persistent atrial fibrillation: Secondary | ICD-10-CM

## 2016-02-08 DIAGNOSIS — I4891 Unspecified atrial fibrillation: Secondary | ICD-10-CM | POA: Diagnosis not present

## 2016-02-08 DIAGNOSIS — E785 Hyperlipidemia, unspecified: Secondary | ICD-10-CM | POA: Insufficient documentation

## 2016-02-08 DIAGNOSIS — I10 Essential (primary) hypertension: Secondary | ICD-10-CM | POA: Diagnosis not present

## 2016-02-08 DIAGNOSIS — I481 Persistent atrial fibrillation: Secondary | ICD-10-CM | POA: Diagnosis not present

## 2016-02-08 DIAGNOSIS — Z7901 Long term (current) use of anticoagulants: Secondary | ICD-10-CM | POA: Insufficient documentation

## 2016-02-08 DIAGNOSIS — I110 Hypertensive heart disease with heart failure: Secondary | ICD-10-CM | POA: Insufficient documentation

## 2016-02-08 DIAGNOSIS — Z87891 Personal history of nicotine dependence: Secondary | ICD-10-CM | POA: Insufficient documentation

## 2016-02-08 DIAGNOSIS — E0590 Thyrotoxicosis, unspecified without thyrotoxic crisis or storm: Secondary | ICD-10-CM | POA: Insufficient documentation

## 2016-02-08 DIAGNOSIS — H409 Unspecified glaucoma: Secondary | ICD-10-CM | POA: Insufficient documentation

## 2016-02-08 DIAGNOSIS — N400 Enlarged prostate without lower urinary tract symptoms: Secondary | ICD-10-CM | POA: Insufficient documentation

## 2016-02-08 DIAGNOSIS — I4821 Permanent atrial fibrillation: Secondary | ICD-10-CM | POA: Insufficient documentation

## 2016-02-08 DIAGNOSIS — I513 Intracardiac thrombosis, not elsewhere classified: Secondary | ICD-10-CM | POA: Insufficient documentation

## 2016-02-08 DIAGNOSIS — I509 Heart failure, unspecified: Secondary | ICD-10-CM | POA: Insufficient documentation

## 2016-02-08 DIAGNOSIS — I340 Nonrheumatic mitral (valve) insufficiency: Secondary | ICD-10-CM | POA: Diagnosis not present

## 2016-02-08 DIAGNOSIS — Z85828 Personal history of other malignant neoplasm of skin: Secondary | ICD-10-CM | POA: Insufficient documentation

## 2016-02-08 HISTORY — PX: TEE WITHOUT CARDIOVERSION: SHX5443

## 2016-02-08 LAB — BASIC METABOLIC PANEL
Anion gap: 6 (ref 5–15)
BUN: 12 mg/dL (ref 6–20)
CO2: 26 mmol/L (ref 22–32)
CREATININE: 0.95 mg/dL (ref 0.61–1.24)
Calcium: 9.8 mg/dL (ref 8.9–10.3)
Chloride: 107 mmol/L (ref 101–111)
GFR calc Af Amer: >60 mL/min (ref >60)
GLUCOSE: 103 mg/dL — AB (ref 65–99)
POTASSIUM: 4.3 mmol/L (ref 3.5–5.1)
Sodium: 139 mmol/L (ref 135–145)

## 2016-02-08 LAB — MAGNESIUM: Magnesium: 2 mg/dL (ref 1.7–2.4)

## 2016-02-08 SURGERY — ECHOCARDIOGRAM, TRANSESOPHAGEAL
Anesthesia: Monitor Anesthesia Care

## 2016-02-08 SURGERY — CANCELLED PROCEDURE

## 2016-02-08 MED ORDER — SODIUM CHLORIDE 0.9 % IV SOLN: INTRAVENOUS | Status: DC

## 2016-02-08 MED ORDER — LIDOCAINE VISCOUS 2 % MT SOLN
OROMUCOSAL | Status: AC
  Filled 2016-02-08: qty 15

## 2016-02-08 MED ORDER — SODIUM CHLORIDE 0.9 % IV SOLN
INTRAVENOUS | Status: DC | PRN
  Administered 2016-02-08: 13:00:00 via INTRAVENOUS

## 2016-02-08 MED ORDER — PROPOFOL 500 MG/50ML IV EMUL
INTRAVENOUS | Status: DC | PRN
  Administered 2016-02-08: 50 ug/kg/min via INTRAVENOUS

## 2016-02-08 MED ORDER — BUTAMBEN-TETRACAINE-BENZOCAINE 2-2-14 % EX AERO
INHALATION_SPRAY | CUTANEOUS | Status: DC | PRN
  Administered 2016-02-08: 2 via TOPICAL

## 2016-02-08 NOTE — Op Note (Signed)
LA appendage with significant spontaneous contrast that did not clear   Concerning for thrombus/thrombus in formation.  Full report to follow in CV section of chart.

## 2016-02-08 NOTE — Interval H&P Note (Signed)
History and Physical Interval Note:  02/08/2016 12:28 PM  Antonio [name]  has presented today for surgery, with the diagnosis of a fib  The various methods of treatment have been discussed with the patient and family. After consideration of risks, benefits and other options for treatment, the patient has consented to  Procedure(s): TRANSESOPHAGEAL ECHOCARDIOGRAM (TEE) (N/A) CARDIOVERSION (N/A) as a surgical intervention .  The patient's history has been reviewed, patient examined, no change in status, stable for surgery.  I have reviewed the patient's chart and labs.  Questions were answered to the patient's satisfaction.     Dorris Carnes

## 2016-02-08 NOTE — H&P (View-Only) (Signed)
Patient ID: Antonio [name], male   DOB: [birth year], 82 y.o.   MRN: [id number]     Primary Care Physician: Kathlene November, MD Referring Physician: [name], PA Electrophysiologist: [name] Cardiologist: None   Antonio [name] is a 82 y.o. male with a h/o afib that has maintained SR on amiodarone x 7-8 years. Prior to that had [place] which normalized with return of SR.  While visiting with his daughter several weeks ago in [place], [place], he had a syncopal episode. He was hospitalized at [facility] was found to hyperthyroid with afib RVR.Marland KitchenAmiodarone was d/ced. He was off anticoagulation due to remote  GI bleed and pt's desires  not to resume blood thinners. Plans were made for direct current cardioversion, however a TEE was performed which showed a left atrial thrombus. Left ventricular ejection fraction was normal at 55-60%.Chadsvasc score is at least 3. There were no significant valve abnormalities. Due to visualization of the left atrial thrombus, his cardioversion was canceled. He was placed on anticoagulation therapy with Eliquis, 5 mg twice a day. He was treated with rate control agents, Cardizem 120 mg in addition to Metroprolol 12.5 mg twice a day. He was instructed to continue aspirin along with Eliquis.  He was sen by [name], PA 5/8 and referred here for further addressing of restoring SR with AAD's and repeat cardioversion when it is thought to be safe to resume when thyroid is normalized and thrombus dissolved. Pt did not sign up for Medicare D at 47 and currently has no drug coverage. Daughter is trying to get him signed up for drug coverage but it will not be before October. If [name] is used, cost may be a big concern, unless pt qualifies for pt assistance. QTc today is 523 ms,but RBBB is contributing.Qtc in SR around 440 ms. Last TSH drawn yesterday shows that thyroid is improving off amiodarone and on methimazole 10 mg daily. TSH 0.07 3 months ago and 6/7 0.31  and methimazole reduced to 1 tab daily by [name].   Pt is symptomatic with Afib and is c/o fatigue and shortness of breath with activity. No PND, orthopnea, appears normvolemic. Off amiodarone around 6 weeks now. Rate control is reasonable and reports HR in 80's/low 90's at home. On BB/CCB.  Today, he denies symptoms of  chest pain,  orthopnea, PND, lower extremity edema, dizziness, presyncope, syncope, or neurologic sequela. Positive for shortness of breath, palpitations, fatigue The patient is tolerating medications without difficulties and is otherwise without complaint today.   Past Medical History  Diagnosis Date  . Hypertension   . Cardiomyopathy     secondary to Tachycardia. A-Acute on chronic admit for CHF 6/08. B- CHF coupled with atrial fib RVR electrophysiology study 09/27/07.  A- Multiple flutters and Atrial fib- no ablation performed b- ibutilide cardoversion c- amiordarone load- d/c in sinus rhyrthm d- coumadin anticoagulation- stopped at time of GI bleed 11/09  . [place] - Weiss tear     esophagitis, 6 unit hemorrhage- EPI injections   . Glaucoma   . Atrial fibrillation ([place])   . Appendicitis     SBP 7/09 ruputre w/ RLQ abscess 03/16/08  . Hyperlipidemia   . BPH (benign prostatic hyperplasia) 06/29/2011  . Small bowel obstruction ([place])   . Knee fracture, right 08/2014  . Basal cell carcinoma of nose 2016    Right side nasal tip, [name]   Past Surgical History  Procedure Laterality Date  . Hemorrhoid surgery    .  Tee/dccv  02/07/07  . Ep study  09/27/07    see pmh  . Appendectomy      exploratory, ruptured appendix, RLQ abscess  . Polypectomy    . Colonoscopy    . Bcc nose surgery      Current Outpatient Prescriptions  Medication Sig Dispense Refill  . apixaban (ELIQUIS) 5 MG TABS tablet Take 5 mg by mouth 2 (two) times daily.    Marland Kitchen diltiazem (CARDIZEM CD) 120 MG 24 hr capsule Take 120 mg by mouth daily.    . dorzolamide (TRUSOPT) 2 % ophthalmic solution Place 1  drop into both eyes 2 (two) times daily.      . methimazole (TAPAZOLE) 10 MG tablet Take 1 tablet (10 mg total) by mouth 2 (two) times daily. 60 tablet 2  . metoprolol tartrate (LOPRESSOR) 25 MG tablet Take 12.5 mg by mouth 2 (two) times daily.    . Multiple Vitamins-Minerals (CENTRUM) tablet Take 1 tablet by mouth daily.       No current facility-administered medications for this encounter.    Allergies  Allergen Reactions  . Amiodarone Other (See Comments)    Thyroid toxicity    Social History   Social History  . Marital Status: Married    Spouse Name: N/A  . Number of Children: 3  . Years of Education: N/A   Occupational History  . Cabinetmaker     works part time   Social History Main Topics  . Smoking status: Former Smoker    Quit date: 06/30/1971  . Smokeless tobacco: Never Used     Comment: quit tobacco remotely , ~ 1 ppd  . Alcohol Use: No  . Drug Use: No  . Sexual Activity: Not on file   Other Topics Concern  . Not on file   Social History Narrative   Household-- pt and wife (wife developing dementia)   One son lives in [place], goes there from time to time              Family History  Problem Relation Age of Onset  . Uterine cancer Mother   . Colon cancer Neg Hx   . Prostate cancer Neg Hx   . Diabetes Neg Hx   . Heart attack Maternal Uncle   . Heart disease Maternal Uncle     ROS- All systems are reviewed and negative except as per the HPI above  Physical Exam: Filed Vitals:   01/28/16 1356  BP: 118/72  Pulse: 105  Height: 6\' 3"  (1.905 m)  Weight: 238 lb (107.956 kg)    GEN- The patient is well appearing, alert and oriented x 3 today.   Head- normocephalic, atraumatic Eyes-  Sclera clear, conjunctiva pink Ears- hearing intact Oropharynx- clear Neck- supple, no JVP Lymph- no cervical lymphadenopathy Lungs- Clear to ausculation bilaterally, normal work of breathing Heart- Irregular rate and rhythm, no murmurs, rubs or gallops, PMI  not laterally displaced GI- soft, NT, ND, + BS Extremities- no clubbing, cyanosis, or edema MS- no significant deformity or atrophy Skin- no rash or lesion Psych- euthymic mood, full affect Neuro- strength and sensation are intact  EKG-Afib with v rate 105 bpm, LAD, RBBB, qrs int 158 ms, Qtc 523 ms  Assessment and Plan: 1.Persistent symptomatic  afib secondary to thyroid toxicity with amiodarone use Off amiodarone x 6 weeks, amiodarone level today Bmet, mag with daughter describing low K+/mag at time of hospitalization with reported diarrhea, resolved Will discuss with Dr Caryl Comes to try to restore  SR: best antiarrythmic and timing of TEE/cardioversion with prior thrombus  2. Hyperthyroidism TSH improving and almost back in normal range Continue treatment with Dr. Shearon Stalls    3. Left atrial thrombus Continue eliquis, tolerating without issues Will need repeat TEE when DCCV is rescheduled CBC today with new use of DOAC and prior GI bleed  F/u per discussion with Dr. Norma Fredrickson C. Dylynn Ketner, [facility] [address] [place], [place] [address] [phone]  Addendum: Per [name], can proceed with Tikosyn, will have TEE day of admission to hospital for loading with h/o prior LA thrombus.

## 2016-02-08 NOTE — Progress Notes (Signed)
Patient ID: RYATT CARODINE, male   DOB: 06-15-1929, 82 y.o.   MRN: [id number]       Primary Care Physician: Kathlene November, MD Electrophysiologist: Dr. Lin Givens is a 82 y.o. male with a h/o afib that has maintained SR on amiodarone x 7-8 years.  While visiting with his daughter several weeks ago in [place], [place], he had a syncopal episode. He was hospitalized at [facility] and was found to be hyperthyroid with afib RVR. Amiodarone was d/ced. He was off anticoagulation due to remote GI bleed and pt's desires not to resume blood thinners. Plans were made for direct current cardioversion, however a TEE was performed which showed a left atrial thrombus. Left ventricular ejection fraction was normal at 55-60%.Chadsvasc score is at least 3. There were no significant valve abnormalities. Due to visualization of the left atrial thrombus, his cardioversion was canceled. He was placed on anticoagulation therapy with Eliquis, 5 mg twice a day. He was treated with rate control agents, Cardizem 120 mg in addition to Metroprolol 12.5 mg twice a day. He was instructed to continue aspirin along with Eliquis.   He was seen by Afib Clinic on 01/28/16 to address restoration of SR and AAD's.  Discussed Tikosyn.  Concern for this given cost and pt does not have any medication insurance coverage.  He will not be able to enroll in a Part D plan until October and would not be activated until January.  Discussed the cost with pt.  He is willing to pay cash for Tikosyn.  His amiodarone level was drawn on 6/8.  Level was 0.3.  He has been anticoagulated with Eliquis.  Plan for repeat TEE prior to starting Tikosyn.   Reviewed pt's medication list.  He is currently not taking any QTc prolongating or contraindicated medications.  Educated on potential side effects including QTc prolongation.  He is aware to call the office if he misses more than 2 doses in a row.   EKG reviewed by Dr. Curt Bears.  Afib with vent  rate of 79 bpm.  QTc 474 msec.  He has a RBBB and QTc in SR is ~424msec so okay per Dr. Curt Bears to proceed with admission.    Past Medical History  Diagnosis Date  . Hypertension   . Cardiomyopathy     secondary to Tachycardia. A-Acute on chronic admit for CHF 6/08. B- CHF coupled with atrial fib RVR electrophysiology study 09/27/07.  A- Multiple flutters and Atrial fib- no ablation performed b- ibutilide cardoversion c- amiordarone load- d/c in sinus rhyrthm d- coumadin anticoagulation- stopped at time of GI bleed 11/09  . Mallory - Weiss tear     esophagitis, 6 unit hemorrhage- EPI injections   . Glaucoma   . Atrial fibrillation ([place])   . Appendicitis     SBP 7/09 ruputre w/ RLQ abscess 03/16/08  . Hyperlipidemia   . BPH (benign prostatic hyperplasia) 06/29/2011  . Small bowel obstruction ([place])   . Knee fracture, right 08/2014  . Basal cell carcinoma of nose 2016    Right side nasal tip, [name]   Past Surgical History  Procedure Laterality Date  . Hemorrhoid surgery    . Tee/dccv  02/07/07  . Ep study  09/27/07    see pmh  . Appendectomy      exploratory, ruptured appendix, RLQ abscess  . Polypectomy    . Colonoscopy    . Bcc nose surgery      Current  Outpatient Prescriptions  Medication Sig Dispense Refill  . apixaban (ELIQUIS) 5 MG TABS tablet Take 1 tablet (5 mg total) by mouth 2 (two) times daily. 180 tablet 3  . diltiazem (CARDIZEM CD) 120 MG 24 hr capsule Take 120 mg by mouth daily.    . dorzolamide (TRUSOPT) 2 % ophthalmic solution Place 1 drop into both eyes 2 (two) times daily.      . methimazole (TAPAZOLE) 10 MG tablet Take 1 tablet (10 mg total) by mouth 2 (two) times daily. 60 tablet 2  . metoprolol tartrate (LOPRESSOR) 25 MG tablet Take 12.5 mg by mouth 2 (two) times daily.    . Multiple Vitamins-Minerals (CENTRUM) tablet Take 1 tablet by mouth daily.       No current facility-administered medications for this visit.    Allergies  Allergen Reactions  .  Amiodarone Other (See Comments)    Thyroid toxicity    Assessment and Plan: 1.Persistent Afib-  Reviewed pt's labs.  K and Mg acceptable to start Tikosyn.  Pt went to hospital for TEE with plans to start Tikosyn.   Elberta Leatherwood, PharmD, BCPS, CPP

## 2016-02-08 NOTE — Anesthesia Preprocedure Evaluation (Addendum)
Anesthesia Evaluation  Patient identified by MRN, date of birth, ID band Patient awake    Reviewed: Allergy & Precautions, H&P , Patient's Chart, lab work & pertinent test results, reviewed documented beta blocker date and time   Airway Mallampati: II  TM Distance: >3 FB Neck ROM: full    Dental no notable dental hx.    Pulmonary former smoker,    Pulmonary exam normal breath sounds clear to auscultation       Cardiovascular hypertension,  Rhythm:regular Rate:Normal     Neuro/Psych    GI/Hepatic   Endo/Other    Renal/GU      Musculoskeletal   Abdominal   Peds  Hematology   Anesthesia Other Findings Good LV fxn: 55%EF Recently healthy. NPO okay Airway fine  Reproductive/Obstetrics                            Anesthesia Physical Anesthesia Plan  ASA: II  Anesthesia Plan: MAC   Post-op Pain Management:    Induction: Intravenous  Airway Management Planned: Mask and Natural Airway  Additional Equipment:   Intra-op Plan:   Post-operative Plan:   Informed Consent: I have reviewed the patients History and Physical, chart, labs and discussed the procedure including the risks, benefits and alternatives for the proposed anesthesia with the patient or authorized representative who has indicated his/her understanding and acceptance.   Dental Advisory Given  Plan Discussed with: CRNA and Surgeon  Anesthesia Plan Comments: (Discussed sedation and potential to need to place airway or ETT if warranted by clinical changes intra-operatively. We will start procedure as MAC.)        Anesthesia Quick Evaluation

## 2016-02-08 NOTE — Transfer of Care (Signed)
Immediate Anesthesia Transfer of Care Note  Patient: Antonio [name]  Procedure(s) Performed: Procedure(s): TRANSESOPHAGEAL ECHOCARDIOGRAM (TEE) (N/A) CARDIOVERSION (N/A)  Patient Location: Endoscopy Unit  Anesthesia Type:MAC  Level of Consciousness: awake, alert , oriented and sedated  Airway & Oxygen Therapy: Patient Spontanous Breathing and Patient connected to nasal cannula oxygen  Post-op Assessment: Report given to RN, Post -op Vital signs reviewed and stable and Patient moving all extremities X 4  Post vital signs: Reviewed and stable  Last Vitals: There were no vitals filed for this visit.  Last Pain: There were no vitals filed for this visit.       Complications: No apparent anesthesia complications

## 2016-02-08 NOTE — Progress Notes (Signed)
  Echocardiogram Echocardiogram Transesophageal has been performed.  Antonio [name] 02/08/2016, 1:58 PM

## 2016-02-08 NOTE — Interval H&P Note (Signed)
History and Physical Interval Note:  02/08/2016 10:49 AM  Antonio [name]  has presented today for surgery, with the diagnosis of AFIB  The various methods of treatment have been discussed with the patient and family. After consideration of risks, benefits and other options for treatment, the patient has consented to  Procedure(s): TRANSESOPHAGEAL ECHOCARDIOGRAM (TEE) (N/A) as a surgical intervention .  The patient's history has been reviewed, patient examined, no change in status, stable for surgery.  I have reviewed the patient's chart and labs.  Questions were answered to the patient's satisfaction.     Dorris Carnes

## 2016-02-08 NOTE — Anesthesia Postprocedure Evaluation (Addendum)
Anesthesia Post Note  Patient: Antonio [name]  Procedure(s) Performed: Procedure(s) (LRB): TRANSESOPHAGEAL ECHOCARDIOGRAM (TEE) (N/A) CARDIOVERSION (N/A)  Patient location during evaluation: PACU Anesthesia Type: MAC Level of consciousness: awake and alert Pain management: pain level controlled Vital Signs Assessment: post-procedure vital signs reviewed and stable Respiratory status: spontaneous breathing, nonlabored ventilation, respiratory function stable and patient connected to nasal cannula oxygen Cardiovascular status: stable and blood pressure returned to baseline Anesthetic complications: no    Last Vitals:  Filed Vitals:   02/08/16 1420 02/08/16 1430  BP: 126/78 129/88  Pulse: 91 80  Temp:    Resp: 26 22    Last Pain: There were no vitals filed for this visit.               Riccardo Dubin

## 2016-02-08 NOTE — H&P (View-Only) (Signed)
Patient ID: Antonio [name], male   DOB: [birth year], 82 y.o.   MRN: [id number]     Primary Care Physician: Kathlene November, MD Referring Physician: [name], PA Electrophysiologist: [name] Cardiologist: None   Antonio [name] is a 82 y.o. male with a h/o afib that has maintained SR on amiodarone x 7-8 years. Prior to that had [place] which normalized with return of SR.  While visiting with his daughter several weeks ago in [place], [place], he had a syncopal episode. He was hospitalized at [facility] was found to hyperthyroid with afib RVR.Marland KitchenAmiodarone was d/ced. He was off anticoagulation due to remote  GI bleed and pt's desires  not to resume blood thinners. Plans were made for direct current cardioversion, however a TEE was performed which showed a left atrial thrombus. Left ventricular ejection fraction was normal at 55-60%.Chadsvasc score is at least 3. There were no significant valve abnormalities. Due to visualization of the left atrial thrombus, his cardioversion was canceled. He was placed on anticoagulation therapy with Eliquis, 5 mg twice a day. He was treated with rate control agents, Cardizem 120 mg in addition to Metroprolol 12.5 mg twice a day. He was instructed to continue aspirin along with Eliquis.  He was sen by [name], PA 5/8 and referred here for further addressing of restoring SR with AAD's and repeat cardioversion when it is thought to be safe to resume when thyroid is normalized and thrombus dissolved. Pt did not sign up for Medicare D at 54 and currently has no drug coverage. Daughter is trying to get him signed up for drug coverage but it will not be before October. If [name] is used, cost may be a big concern, unless pt qualifies for pt assistance. QTc today is 523 ms,but RBBB is contributing.Qtc in SR around 440 ms. Last TSH drawn yesterday shows that thyroid is improving off amiodarone and on methimazole 10 mg daily. TSH 0.07 3 months ago and 6/7 0.31  and methimazole reduced to 1 tab daily by [name].   Pt is symptomatic with Afib and is c/o fatigue and shortness of breath with activity. No PND, orthopnea, appears normvolemic. Off amiodarone around 6 weeks now. Rate control is reasonable and reports HR in 80's/low 90's at home. On BB/CCB.  Today, he denies symptoms of  chest pain,  orthopnea, PND, lower extremity edema, dizziness, presyncope, syncope, or neurologic sequela. Positive for shortness of breath, palpitations, fatigue The patient is tolerating medications without difficulties and is otherwise without complaint today.   Past Medical History  Diagnosis Date  . Hypertension   . Cardiomyopathy     secondary to Tachycardia. A-Acute on chronic admit for CHF 6/08. B- CHF coupled with atrial fib RVR electrophysiology study 09/27/07.  A- Multiple flutters and Atrial fib- no ablation performed b- ibutilide cardoversion c- amiordarone load- d/c in sinus rhyrthm d- coumadin anticoagulation- stopped at time of GI bleed 11/09  . Mallory - Weiss tear     esophagitis, 6 unit hemorrhage- EPI injections   . Glaucoma   . Atrial fibrillation ([place])   . Appendicitis     SBP 7/09 ruputre w/ RLQ abscess 03/16/08  . Hyperlipidemia   . BPH (benign prostatic hyperplasia) 06/29/2011  . Small bowel obstruction ([place])   . Knee fracture, right 08/2014  . Basal cell carcinoma of nose 2016    Right side nasal tip, [name]   Past Surgical History  Procedure Laterality Date  . Hemorrhoid surgery    .  Tee/dccv  02/07/07  . Ep study  09/27/07    see pmh  . Appendectomy      exploratory, ruptured appendix, RLQ abscess  . Polypectomy    . Colonoscopy    . Bcc nose surgery      Current Outpatient Prescriptions  Medication Sig Dispense Refill  . apixaban (ELIQUIS) 5 MG TABS tablet Take 5 mg by mouth 2 (two) times daily.    Marland Kitchen diltiazem (CARDIZEM CD) 120 MG 24 hr capsule Take 120 mg by mouth daily.    . dorzolamide (TRUSOPT) 2 % ophthalmic solution Place 1  drop into both eyes 2 (two) times daily.      . methimazole (TAPAZOLE) 10 MG tablet Take 1 tablet (10 mg total) by mouth 2 (two) times daily. 60 tablet 2  . metoprolol tartrate (LOPRESSOR) 25 MG tablet Take 12.5 mg by mouth 2 (two) times daily.    . Multiple Vitamins-Minerals (CENTRUM) tablet Take 1 tablet by mouth daily.       No current facility-administered medications for this encounter.    Allergies  Allergen Reactions  . Amiodarone Other (See Comments)    Thyroid toxicity    Social History   Social History  . Marital Status: Married    Spouse Name: N/A  . Number of Children: 3  . Years of Education: N/A   Occupational History  . Cabinetmaker     works part time   Social History Main Topics  . Smoking status: Former Smoker    Quit date: 06/30/1971  . Smokeless tobacco: Never Used     Comment: quit tobacco remotely , ~ 1 ppd  . Alcohol Use: No  . Drug Use: No  . Sexual Activity: Not on file   Other Topics Concern  . Not on file   Social History Narrative   Household-- pt and wife (wife developing dementia)   One son lives in [place], goes there from time to time              Family History  Problem Relation Age of Onset  . Uterine cancer Mother   . Colon cancer Neg Hx   . Prostate cancer Neg Hx   . Diabetes Neg Hx   . Heart attack Maternal Uncle   . Heart disease Maternal Uncle     ROS- All systems are reviewed and negative except as per the HPI above  Physical Exam: Filed Vitals:   01/28/16 1356  BP: 118/72  Pulse: 105  Height: 6\' 3"  (1.905 m)  Weight: 238 lb (107.956 kg)    GEN- The patient is well appearing, alert and oriented x 3 today.   Head- normocephalic, atraumatic Eyes-  Sclera clear, conjunctiva pink Ears- hearing intact Oropharynx- clear Neck- supple, no JVP Lymph- no cervical lymphadenopathy Lungs- Clear to ausculation bilaterally, normal work of breathing Heart- Irregular rate and rhythm, no murmurs, rubs or gallops, PMI  not laterally displaced GI- soft, NT, ND, + BS Extremities- no clubbing, cyanosis, or edema MS- no significant deformity or atrophy Skin- no rash or lesion Psych- euthymic mood, full affect Neuro- strength and sensation are intact  EKG-Afib with v rate 105 bpm, LAD, RBBB, qrs int 158 ms, Qtc 523 ms  Assessment and Plan: 1.Persistent symptomatic  afib secondary to thyroid toxicity with amiodarone use Off amiodarone x 6 weeks, amiodarone level today Bmet, mag with daughter describing low K+/mag at time of hospitalization with reported diarrhea, resolved Will discuss with Dr Caryl Comes to try to restore  SR: best antiarrythmic and timing of TEE/cardioversion with prior thrombus  2. Hyperthyroidism TSH improving and almost back in normal range Continue treatment with Dr. Shearon Stalls    3. Left atrial thrombus Continue eliquis, tolerating without issues Will need repeat TEE when DCCV is rescheduled CBC today with new use of DOAC and prior GI bleed  F/u per discussion with Dr. Norma Fredrickson C. [place], [facility] [address] [place], [place] [address] [phone]  Addendum: Per [name], can proceed with Tikosyn, will have TEE day of admission to hospital for loading with h/o prior LA thrombus.

## 2016-02-08 NOTE — Discharge Instructions (Signed)

## 2016-02-08 NOTE — Progress Notes (Signed)
Pt with significant contrast in LA during TEE.  Will cancel DCCV and Tikosyn load for now.  Appt with CVRR to discuss changing to Warfarin to measure compliance and then repeat TEE/DCCV in 4 weeks.  Appt scheduled with CVRR as well as AF clinic.  Chanetta Marshall, NP 02/08/2016 2:27 PM

## 2016-02-08 NOTE — H&P (Signed)
ELECTROPHYSIOLOGY HISTORY AND PHYSICAL    Patient ID: Antonio [name] MRN: [id number], DOB/AGE: 80-01-1929 82 y.o.  Admit date: 02/08/2016 Date of Consult: 02/08/2016  Primary Physician: Kathlene November, MD Primary Cardiologist: Caryl Comes  CC: here for Tikosyn load  HPI:  Antonio [name] is a 82 y.o. male with a past medical history significant for persistent atrial fibrillation, hypertension, tachycardia induced cardiomyopathy, and prior GI bleed 2/2 Mallory-Weiss tear.  He was previously maintained on amiodarone but was found to be hyperthyroid and amiodarone was discontinued.  He had recurrent atrial fibrillation off of amiodarone and DCCV was pursued.  TEE showed left atrial thrombus and he was continued on rate control until today.  He has been seen by Dr Caryl Comes and [name], NP in the AF clinic and decision was made to proceed with Tikosyn load.  Amiodarone level 0.3 on 01/28/16.  He reports compliance with Eliquis for the last several weeks but did miss one dose earlier this week.   He remains weak and fatigued and also has shortness of breath with exertion but denies chest pain, LE edema, recent fevers, chills, nausea or vomiting.   Past Medical History  Diagnosis Date  . Hypertension   . Cardiomyopathy     secondary to Tachycardia. A-Acute on chronic admit for CHF 6/08. B- CHF coupled with atrial fib RVR electrophysiology study 09/27/07.  A- Multiple flutters and Atrial fib- no ablation performed b- ibutilide cardoversion c- amiordarone load- d/c in sinus rhyrthm d- coumadin anticoagulation- stopped at time of GI bleed 11/09  . Mallory - Weiss tear     esophagitis, 6 unit hemorrhage- EPI injections   . Glaucoma   . Atrial fibrillation ([place])   . Appendicitis     SBP 7/09 ruputre w/ RLQ abscess 03/16/08  . Hyperlipidemia   . BPH (benign prostatic hyperplasia) 06/29/2011  . Small bowel obstruction ([place])   . Knee fracture, right 08/2014  . Basal cell carcinoma of nose 2016    Right side nasal  tip, [name]     Surgical History:  Past Surgical History  Procedure Laterality Date  . Hemorrhoid surgery    . Tee/dccv  02/07/07  . Ep study  09/27/07    see pmh  . Appendectomy      exploratory, ruptured appendix, RLQ abscess  . Polypectomy    . Colonoscopy    . Bcc nose surgery       Prescriptions prior to admission  Medication Sig Dispense Refill Last Dose  . apixaban (ELIQUIS) 5 MG TABS tablet Take 1 tablet (5 mg total) by mouth 2 (two) times daily. 180 tablet 3 02/08/2016 at Unknown time  . diltiazem (CARDIZEM CD) 120 MG 24 hr capsule Take 120 mg by mouth daily.   02/08/2016 at Unknown time  . dorzolamide (TRUSOPT) 2 % ophthalmic solution Place 1 drop into both eyes 2 (two) times daily.     02/08/2016 at Unknown time  . methimazole (TAPAZOLE) 10 MG tablet Take 1 tablet (10 mg total) by mouth 2 (two) times daily. 60 tablet 2 02/08/2016 at Unknown time  . metoprolol tartrate (LOPRESSOR) 25 MG tablet Take 12.5 mg by mouth 2 (two) times daily.   02/08/2016 at Unknown time  . Multiple Vitamins-Minerals (CENTRUM) tablet Take 1 tablet by mouth daily.     02/08/2016 at Unknown time    Inpatient Medications:    Allergies:  Allergies  Allergen Reactions  . Amiodarone Other (See Comments)    Thyroid toxicity  Social History   Social History  . Marital Status: Married    Spouse Name: N/A  . Number of Children: 3  . Years of Education: N/A   Occupational History  . Cabinetmaker     works part time   Social History Main Topics  . Smoking status: Former Smoker    Quit date: 06/30/1971  . Smokeless tobacco: Never Used     Comment: quit tobacco remotely , ~ 1 ppd  . Alcohol Use: No  . Drug Use: No  . Sexual Activity: Not on file   Other Topics Concern  . Not on file   Social History Narrative   Household-- pt and wife (wife developing dementia)   One son lives in [place], goes there from time to time               Family History  Problem Relation Age of Onset   . Uterine cancer Mother   . Colon cancer Neg Hx   . Prostate cancer Neg Hx   . Diabetes Neg Hx   . Heart attack Maternal Uncle   . Heart disease Maternal Uncle      Review of Systems: All other systems reviewed and are otherwise negative except as noted above.  Physical Exam: Filed Vitals:   02/08/16 1129  BP: 144/94  Pulse: 81  Temp: 97.7 F (36.5 C)  TempSrc: Oral  Resp: 9  SpO2: 100%    GEN- The patient is elderly appearing, alert and oriented x 3 today.   HEENT: normocephalic, atraumatic; sclera clear, conjunctiva pink; hearing intact; oropharynx clear; neck supple  Lungs- Clear to ausculation bilaterally, normal work of breathing.  No wheezes, rales, rhonchi Heart- Irregular rate and rhythm, no murmurs, rubs or gallops  GI- soft, non-tender, non-distended, bowel sounds present  Extremities- no clubbing, cyanosis, or edema; DP/PT/radial pulses 2+ bilaterally MS- no significant deformity or atrophy Skin- warm and dry, no rash or lesion Psych- euthymic mood, full affect Neuro- strength and sensation are intact  Labs:   Lab Results  Component Value Date   WBC 6.9 01/28/2016   HGB 13.7 01/28/2016   HCT 42.1 01/28/2016   MCV 89.0 01/28/2016   PLT 231 01/28/2016    Recent Labs Lab 02/08/16 1108  NA 139  K 4.3  CL 107  CO2 26  BUN 12  CREATININE 0.95  CALCIUM 9.8  GLUCOSE 103*      Radiology/Studies: No results found.  [id number] fibrillation, rate 74, RBBB, QTC 479  Assessment/Plan; 1.  Persistent atrial fibrillation The patient has persistent atrial fibrillation and amiodarone was discontinued earlier this year with hyperthyroidism.  Amio level 01/28/16 0.3.  Plan for TEE today as recent TEE showed LA thrombus.   BMET, Mg, EKG ok (RBBB) Will plan for TEE/DCCV today then start Tikosyn tonight With CrCl, will plan for 560mcg twice daily Continue Eliquis for CHADS2VASC of 3 LA 51 at echo 2010, our ability to maintain SR long term may be limited  2.   Hyperthyroidism Followed by Dr Dwyane Dee  3.  HTN Stable No change required today   Signed, Chanetta Marshall, NP 02/08/2016 12:00 PM

## 2016-02-09 ENCOUNTER — Telehealth: Payer: Self-pay | Admitting: Internal Medicine

## 2016-02-09 ENCOUNTER — Encounter (HOSPITAL_COMMUNITY): Payer: Self-pay | Admitting: Internal Medicine

## 2016-02-09 NOTE — Telephone Encounter (Signed)
New message    The representative for the medications assistance program for eliquis wanted to let the nurse know the Eliquis has been approved will be delivered at the patient's home in 5-10 business days and was approve for today thur December 31 st 2017 this is just a [place]

## 2016-02-10 ENCOUNTER — Other Ambulatory Visit: Payer: Self-pay | Admitting: *Deleted

## 2016-02-10 ENCOUNTER — Telehealth: Payer: Self-pay | Admitting: Internal Medicine

## 2016-02-10 MED ORDER — WARFARIN SODIUM 5 MG PO TABS: 5.0000 mg | ORAL_TABLET | ORAL | Status: DC

## 2016-02-10 NOTE — Telephone Encounter (Signed)
Mrs.Temple is calling because they are wanting to know whether Dr.Klein wants him to get on a heart monitor and to change his medication. Because his heart rate is higher than it needed to be . But has not heard anything . Please call   Thanks

## 2016-02-10 NOTE — Telephone Encounter (Signed)
Patient recently seen by CV[facility] on 6/19 for Tikosyn admission. Patient ok for Tikosyn admission- was sent for TEE/ DCCV/ Tikosyn loading.  TEE done per Dr. Harrington Challenger: Left atrium: LA is large There is significant spontaneous contrast throughout LA, expecially in LA appendage. Ther did not appear to be complete clearance of contrast near tip of LA appendage, consistent with clot in formation  DCCV/ Tikosyn initiation cancelled. Patient to f/u with CVRR - 6/26 & A-fib clinic- 02/29/16. I spoke with the patient's daughter. She is unsure of what the patient's HR's are currently running, but states [name] had mentioned the patient wearing a monior. I advised I will forward to [name] for review.

## 2016-02-10 NOTE — Telephone Encounter (Signed)
I spoke with the patient's daughter, [name], she is aware to increase metoprolol to  25 mg BID. I have advised her to have the patient continue to monitor his HR/ BP. If he starts to have dizziness/ lightheadedness- please call us back. She voices understanding.

## 2016-02-10 NOTE — Telephone Encounter (Signed)
Lets increase loprssor to 25 bid

## 2016-02-15 ENCOUNTER — Ambulatory Visit (INDEPENDENT_AMBULATORY_CARE_PROVIDER_SITE_OTHER): Payer: Medicare Other | Admitting: *Deleted

## 2016-02-15 DIAGNOSIS — I482 Chronic atrial fibrillation, unspecified: Secondary | ICD-10-CM

## 2016-02-15 DIAGNOSIS — Z5181 Encounter for therapeutic drug level monitoring: Secondary | ICD-10-CM

## 2016-02-15 DIAGNOSIS — I4821 Permanent atrial fibrillation: Secondary | ICD-10-CM

## 2016-02-15 DIAGNOSIS — I213 ST elevation (STEMI) myocardial infarction of unspecified site: Secondary | ICD-10-CM

## 2016-02-15 DIAGNOSIS — I513 Intracardiac thrombosis, not elsewhere classified: Secondary | ICD-10-CM

## 2016-02-15 LAB — POCT INR: INR: 1.3

## 2016-02-15 NOTE — Patient Instructions (Signed)

## 2016-02-22 ENCOUNTER — Other Ambulatory Visit: Payer: Self-pay | Admitting: Endocrinology

## 2016-02-22 ENCOUNTER — Ambulatory Visit (INDEPENDENT_AMBULATORY_CARE_PROVIDER_SITE_OTHER): Payer: Medicare Other | Admitting: Pharmacist

## 2016-02-22 DIAGNOSIS — I213 ST elevation (STEMI) myocardial infarction of unspecified site: Secondary | ICD-10-CM

## 2016-02-22 DIAGNOSIS — Z5181 Encounter for therapeutic drug level monitoring: Secondary | ICD-10-CM

## 2016-02-22 DIAGNOSIS — I482 Chronic atrial fibrillation, unspecified: Secondary | ICD-10-CM

## 2016-02-22 DIAGNOSIS — I4821 Permanent atrial fibrillation: Secondary | ICD-10-CM

## 2016-02-22 DIAGNOSIS — E0580 Other thyrotoxicosis without thyrotoxic crisis or storm: Secondary | ICD-10-CM

## 2016-02-22 DIAGNOSIS — I513 Intracardiac thrombosis, not elsewhere classified: Secondary | ICD-10-CM

## 2016-02-22 LAB — POCT INR: INR: 2.3

## 2016-02-29 ENCOUNTER — Other Ambulatory Visit (INDEPENDENT_AMBULATORY_CARE_PROVIDER_SITE_OTHER): Payer: Medicare Other

## 2016-02-29 ENCOUNTER — Ambulatory Visit (HOSPITAL_COMMUNITY)
Admission: RE | Admit: 2016-02-29 | Discharge: 2016-02-29 | Disposition: A | Discharge status: Home / Self Care | Payer: Medicare Other | Source: Ambulatory Visit | Attending: Nurse Practitioner | Admitting: Nurse Practitioner

## 2016-02-29 ENCOUNTER — Encounter (HOSPITAL_COMMUNITY): Payer: Self-pay | Admitting: Nurse Practitioner

## 2016-02-29 ENCOUNTER — Ambulatory Visit (INDEPENDENT_AMBULATORY_CARE_PROVIDER_SITE_OTHER): Payer: Medicare Other

## 2016-02-29 VITALS — BP 128/76 | HR 64 | Ht 75.0 in | Wt 244.0 lb

## 2016-02-29 DIAGNOSIS — E785 Hyperlipidemia, unspecified: Secondary | ICD-10-CM | POA: Diagnosis not present

## 2016-02-29 DIAGNOSIS — I213 ST elevation (STEMI) myocardial infarction of unspecified site: Secondary | ICD-10-CM

## 2016-02-29 DIAGNOSIS — I481 Persistent atrial fibrillation: Secondary | ICD-10-CM | POA: Insufficient documentation

## 2016-02-29 DIAGNOSIS — I482 Chronic atrial fibrillation, unspecified: Secondary | ICD-10-CM

## 2016-02-29 DIAGNOSIS — Z85828 Personal history of other malignant neoplasm of skin: Secondary | ICD-10-CM | POA: Insufficient documentation

## 2016-02-29 DIAGNOSIS — I4510 Unspecified right bundle-branch block: Secondary | ICD-10-CM | POA: Insufficient documentation

## 2016-02-29 DIAGNOSIS — Z5181 Encounter for therapeutic drug level monitoring: Secondary | ICD-10-CM | POA: Diagnosis not present

## 2016-02-29 DIAGNOSIS — I4891 Unspecified atrial fibrillation: Secondary | ICD-10-CM | POA: Diagnosis not present

## 2016-02-29 DIAGNOSIS — E0590 Thyrotoxicosis, unspecified without thyrotoxic crisis or storm: Secondary | ICD-10-CM | POA: Diagnosis not present

## 2016-02-29 DIAGNOSIS — I513 Intracardiac thrombosis, not elsewhere classified: Secondary | ICD-10-CM

## 2016-02-29 DIAGNOSIS — E0580 Other thyrotoxicosis without thyrotoxic crisis or storm: Secondary | ICD-10-CM

## 2016-02-29 DIAGNOSIS — Z87891 Personal history of nicotine dependence: Secondary | ICD-10-CM | POA: Insufficient documentation

## 2016-02-29 DIAGNOSIS — H409 Unspecified glaucoma: Secondary | ICD-10-CM | POA: Insufficient documentation

## 2016-02-29 DIAGNOSIS — Z7901 Long term (current) use of anticoagulants: Secondary | ICD-10-CM | POA: Diagnosis not present

## 2016-02-29 DIAGNOSIS — I4821 Permanent atrial fibrillation: Secondary | ICD-10-CM

## 2016-02-29 DIAGNOSIS — N400 Enlarged prostate without lower urinary tract symptoms: Secondary | ICD-10-CM | POA: Insufficient documentation

## 2016-02-29 DIAGNOSIS — I509 Heart failure, unspecified: Secondary | ICD-10-CM | POA: Diagnosis not present

## 2016-02-29 DIAGNOSIS — T462X5A Adverse effect of other antidysrhythmic drugs, initial encounter: Secondary | ICD-10-CM

## 2016-02-29 DIAGNOSIS — I110 Hypertensive heart disease with heart failure: Secondary | ICD-10-CM | POA: Diagnosis not present

## 2016-02-29 LAB — T3, FREE: T3 FREE: 2.6 pg/mL (ref 2.3–4.2)

## 2016-02-29 LAB — TSH: TSH: 6.51 u[IU]/mL — AB (ref 0.35–4.50)

## 2016-02-29 LAB — T4, FREE: FREE T4: 0.63 ng/dL (ref 0.60–1.60)

## 2016-02-29 LAB — POCT INR: INR: 1.9

## 2016-02-29 NOTE — Progress Notes (Signed)
Patient ID: Antonio [name], male   DOB: Nov 01, 1928, 82 y.o.   MRN: [id number]     Primary Care Physician: Kathlene November, MD Referring Physician: [name], PA Electrophysiologist: [name] Cardiologist: None   Antonio [name] is a 82 y.o. male with a h/o afib that has maintained SR on amiodarone x 7-8 years. Prior to that had Cruger which normalized with return of SR.  While visiting with his daughter several weeks ago in [place], [place], he had a syncopal episode. He was hospitalized at [facility] was found to hyperthyroid with afib RVR.Antonio KitchenAmiodarone was d/ced. He was off anticoagulation due to remote  GI bleed and pt's desires  not to resume blood thinners. Plans were made for direct current cardioversion, however a TEE was performed which showed a left atrial thrombus. Left ventricular ejection fraction was normal at 55-60%.Chadsvasc score is at least 3. There were no significant valve abnormalities. Due to visualization of the left atrial thrombus, his cardioversion was canceled. He was placed on anticoagulation therapy with Eliquis, 5 mg twice a day. He was treated with rate control agents, Cardizem 120 mg in addition to Metroprolol 12.5 mg twice a day. He was instructed to continue aspirin along with Eliquis.  He was sen by [name], PA 5/8 and referred here for further addressing of restoring SR with AAD's and repeat cardioversion when it is thought to be safe to resume when thyroid is normalized and thrombus dissolved. Pt did not sign up for Medicare D at 63 and currently has no drug coverage. Daughter is trying to get him signed up for drug coverage but it will not be before October. If [name] is used, cost may be a big concern, unless pt qualifies for pt assistance. QTc today is 523 ms,but RBBB is contributing.Qtc in SR around 440 ms. Last TSH drawn yesterday shows that thyroid is improving off amiodarone and on methimazole 10 mg daily. TSH 0.07 3 months ago and 6/7 0.31  and methimazole reduced to 1 tab daily by [name].   Pt is symptomatic with Afib and is c/o fatigue and shortness of breath with activity. No PND, orthopnea, appears normvolemic. Off amiodarone around 6 weeks now. Rate control is reasonable and reports HR in 80's/low 90's at home. On BB/CCB.  Returns 7/10, he was set up for Tikosyn, hospitalization preceded with TEE to make sure left thrombus was resolved. Unfortunately, It was not. Plans for tikosyn were postponed and decision was made to start coumadin, stop DOAC.Antonio [name] He has been on coumadin for 3 weeks, but has only had one INR that was therapeutic and last one was 1.3. He will have to have 3-4 weeks of therapeutic INR's before repeat TEE can be entertained. He has had lower extremity swelling which he thinks has come about since starting diltiazem. Since his thyroid is normalizing off amiodarone, his HR is better controlled in the 60's and may be able to do without cardizem now. Tolerating afib better at slower v rate.  Today, he denies symptoms of  chest pain,  orthopnea, PND, lower extremity edema, dizziness, presyncope, syncope, or neurologic sequela. Positive for shortness of breath, palpitations, fatigue The patient is tolerating medications without difficulties and is otherwise without complaint today.   Past Medical History  Diagnosis Date  . Hypertension   . Cardiomyopathy     secondary to Tachycardia. A-Acute on chronic admit for CHF 6/08. B- CHF coupled with atrial fib RVR electrophysiology study 09/27/07.  A- Multiple flutters and Atrial  fib- no ablation performed b- ibutilide cardoversion c- amiordarone load- d/c in sinus rhyrthm d- coumadin anticoagulation- stopped at time of GI bleed 11/09  . Mallory - Weiss tear     esophagitis, 6 unit hemorrhage- EPI injections   . Glaucoma   . Atrial fibrillation ([place])   . Appendicitis     SBP 7/09 ruputre w/ RLQ abscess 03/16/08  . Hyperlipidemia   . BPH (benign prostatic hyperplasia) 06/29/2011    . Small bowel obstruction ([place])   . Knee fracture, right 08/2014  . Basal cell carcinoma of nose 2016    Right side nasal tip, [name]   Past Surgical History  Procedure Laterality Date  . Hemorrhoid surgery    . Tee/dccv  02/07/07  . Ep study  09/27/07    see pmh  . Appendectomy      exploratory, ruptured appendix, RLQ abscess  . Polypectomy    . Colonoscopy    . Bcc nose surgery    . Tee without cardioversion N/A 02/08/2016    Procedure: TRANSESOPHAGEAL ECHOCARDIOGRAM (TEE);  Surgeon: Fay Records, MD;  Location: [facility] ENDOSCOPY;  Service: Cardiovascular;  Laterality: N/A;    Current Outpatient Prescriptions  Medication Sig Dispense Refill  . diltiazem (CARDIZEM CD) 120 MG 24 hr capsule Take 120 mg by mouth daily.    . dorzolamide (TRUSOPT) 2 % ophthalmic solution Place 1 drop into both eyes 2 (two) times daily.      . methimazole (TAPAZOLE) 10 MG tablet Take 1 tablet (10 mg total) by mouth 2 (two) times daily. 60 tablet 2  . metoprolol tartrate (LOPRESSOR) 25 MG tablet Take 25 mg by mouth 2 (two) times daily.    . Multiple Vitamins-Minerals (CENTRUM) tablet Take 1 tablet by mouth daily.      Antonio [name] warfarin (COUMADIN) 5 MG tablet Take 1 tablet (5 mg total) by mouth as directed. 30 tablet 3   No current facility-administered medications for this encounter.    Allergies  Allergen Reactions  . Amiodarone Other (See Comments)    Thyroid toxicity    Social History   Social History  . Marital Status: Married    Spouse Name: N/A  . Number of Children: 3  . Years of Education: N/A   Occupational History  . Cabinetmaker     works part time   Social History Main Topics  . Smoking status: Former Smoker    Quit date: 06/30/1971  . Smokeless tobacco: Never Used     Comment: quit tobacco remotely , ~ 1 ppd  . Alcohol Use: No  . Drug Use: No  . Sexual Activity: Not on file   Other Topics Concern  . Not on file   Social History Narrative   Household-- pt and wife (wife  developing dementia)   One son lives in [place], goes there from time to time              Family History  Problem Relation Age of Onset  . Uterine cancer Mother   . Colon cancer Neg Hx   . Prostate cancer Neg Hx   . Diabetes Neg Hx   . Heart attack Maternal Uncle   . Heart disease Maternal Uncle     ROS- All systems are reviewed and negative except as per the HPI above  Physical Exam: Filed Vitals:   02/29/16 1116  BP: 128/76  Pulse: 64  Height: 6\' 3"  (1.905 m)  Weight: 244 lb (110.678 kg)    GEN-  The patient is well appearing, alert and oriented x 3 today.   Head- normocephalic, atraumatic Eyes-  Sclera clear, conjunctiva pink Ears- hearing intact Oropharynx- clear Neck- supple, no JVP Lymph- no cervical lymphadenopathy Lungs- Clear to ausculation bilaterally, normal work of breathing Heart- Irregular rate and rhythm, no murmurs, rubs or gallops, PMI not laterally displaced GI- soft, NT, ND, + BS Extremities- no clubbing, cyanosis, or edema MS- no significant deformity or atrophy Skin- no rash or lesion Psych- euthymic mood, full affect Neuro- strength and sensation are intact  EKG-Afib with v rate 64 bpm,  RBBB, qrs int 150 ms, Qtc 447 ms  Assessment and Plan: 1.Persistent symptomatic  afib secondary to thyroid toxicity with amiodarone use Remains in afib, v rate improved and thyroid is normalizing but unfortunately he still had left atrial thrombus at time of TEE, so tikosyn plans on standby. Continue with metoprolol but can stop diltiazem for lower extremity edema, has f/u with PCP and Endocrine on Wednesday and can see what pt's heart rate is off dilt. If increased, can up dose of metoprolol.  2. Hyperthyroidism TSH improving and almost back in normal range Labs were drawn earlier today and pending for f/u with [name] on Wednesday  3.Persisitent left atrial thrombus Off Doac, on coumadin Continue coumadin with coumadin clinic Will need 3-4 weeks  stable therapeutic INR's before can schedule repeat  TEE When thrombus is resolved, can schedule Tikosyn hospitalization, hopefully to restore SR  F/u in 6 weeks   Butch Penny C. Lorenzo Pereyra, [facility] [address] [place], [place] [address] [phone]

## 2016-02-29 NOTE — Patient Instructions (Signed)
Your physician has recommended you make the following change in your medication: Stop Diltiazem  Your physician recommends that you schedule a follow-up appointment in: 6 weeks, this has been scheduled

## 2016-03-02 ENCOUNTER — Ambulatory Visit (INDEPENDENT_AMBULATORY_CARE_PROVIDER_SITE_OTHER): Payer: Medicare Other | Admitting: Internal Medicine

## 2016-03-02 ENCOUNTER — Ambulatory Visit (INDEPENDENT_AMBULATORY_CARE_PROVIDER_SITE_OTHER): Payer: Medicare Other | Admitting: Endocrinology

## 2016-03-02 ENCOUNTER — Encounter: Payer: Self-pay | Admitting: Internal Medicine

## 2016-03-02 VITALS — BP 118/62 | HR 85 | Temp 98.2°F | Ht 75.0 in | Wt 242.2 lb

## 2016-03-02 VITALS — BP 132/86 | HR 99 | Ht 75.0 in | Wt 243.0 lb

## 2016-03-02 DIAGNOSIS — E0580 Other thyrotoxicosis without thyrotoxic crisis or storm: Secondary | ICD-10-CM

## 2016-03-02 DIAGNOSIS — R634 Abnormal weight loss: Secondary | ICD-10-CM

## 2016-03-02 DIAGNOSIS — I10 Essential (primary) hypertension: Secondary | ICD-10-CM | POA: Diagnosis not present

## 2016-03-02 DIAGNOSIS — T462X5A Adverse effect of other antidysrhythmic drugs, initial encounter: Secondary | ICD-10-CM

## 2016-03-02 DIAGNOSIS — E0590 Thyrotoxicosis, unspecified without thyrotoxic crisis or storm: Secondary | ICD-10-CM

## 2016-03-02 DIAGNOSIS — I4891 Unspecified atrial fibrillation: Secondary | ICD-10-CM

## 2016-03-02 MED ORDER — METOPROLOL TARTRATE 25 MG PO TABS: 37.5000 mg | ORAL_TABLET | Freq: Two times a day (BID) | ORAL | Status: DC

## 2016-03-02 NOTE — Progress Notes (Signed)
Pre visit review using our clinic review tool, if applicable. No additional management support is needed unless otherwise documented below in the visit note. 

## 2016-03-02 NOTE — Patient Instructions (Signed)
Reduce Methimazole to 1 pill daily instead of 2

## 2016-03-02 NOTE — Progress Notes (Signed)
Subjective:    Patient ID: Antonio [name], male    DOB: 30-Mar-1929, 82 y.o.   MRN: [id number]  DOS:  03/02/2016 Type of visit - description : Routine visit Interval history: Recently seen by cardiology, due to edema diltiazem was discontinued 2 days ago. Edema has not changed much since.   Wt Readings from Last 3 Encounters:  03/02/16 243 lb (110.224 kg)  03/02/16 242 lb 4 oz (109.884 kg)  02/29/16 244 lb (110.678 kg)   BP Readings from Last 3 Encounters:  03/02/16 132/86  03/02/16 118/62  02/29/16 128/76     Review of Systems Denies chest pain, difficulty breathing or palpitations. He does have some DOE after walking 400 feet, at baseline. No nausea, vomiting. No further diarrhea. Admits to some fatigue but no orthostatic symptoms.  Past Medical History  Diagnosis Date  . Hypertension   . Cardiomyopathy     secondary to Tachycardia. A-Acute on chronic admit for CHF 6/08. B- CHF coupled with atrial fib RVR electrophysiology study 09/27/07.  A- Multiple flutters and Atrial fib- no ablation performed b- ibutilide cardoversion c- amiordarone load- d/c in sinus rhyrthm d- coumadin anticoagulation- stopped at time of GI bleed 11/09  . Mallory - Weiss tear     esophagitis, 6 unit hemorrhage- EPI injections   . Glaucoma   . Atrial fibrillation ([place])   . Appendicitis     SBP 7/09 ruputre w/ RLQ abscess 03/16/08  . Hyperlipidemia   . BPH (benign prostatic hyperplasia) 06/29/2011  . Small bowel obstruction ([place])   . Knee fracture, right 08/2014  . Basal cell carcinoma of nose 2016    Right side nasal tip, [name]    Past Surgical History  Procedure Laterality Date  . Hemorrhoid surgery    . Tee/dccv  02/07/07  . Ep study  09/27/07    see pmh  . Appendectomy      exploratory, ruptured appendix, RLQ abscess  . Polypectomy    . Colonoscopy    . Bcc nose surgery    . Tee without cardioversion N/A 02/08/2016    Procedure: TRANSESOPHAGEAL ECHOCARDIOGRAM (TEE);  Surgeon: Fay Records, MD;  Location: [facility] ENDOSCOPY;  Service: Cardiovascular;  Laterality: N/A;    Social History   Social History  . Marital Status: Married    Spouse Name: N/A  . Number of Children: 3  . Years of Education: N/A   Occupational History  . Cabinetmaker     works part time   Social History Main Topics  . Smoking status: Former Smoker    Quit date: 06/30/1971  . Smokeless tobacco: Never Used     Comment: quit tobacco remotely , ~ 1 ppd  . Alcohol Use: No  . Drug Use: No  . Sexual Activity: Not on file   Other Topics Concern  . Not on file   Social History Narrative   Household-- pt and wife (wife developing dementia)   One son lives in [place], goes there from time to time                  Medication List       This list is accurate as of: 03/02/16  6:07 PM.  Always use your most recent med list.               CENTRUM tablet  Take 1 tablet by mouth daily.     dorzolamide 2 % ophthalmic solution  Commonly known as:  TRUSOPT  Place 1 drop into both eyes 2 (two) times daily.     methimazole 10 MG tablet  Commonly known as:  TAPAZOLE  Take 1 tablet (10 mg total) by mouth 2 (two) times daily.     metoprolol tartrate 25 MG tablet  Commonly known as:  LOPRESSOR  Take 1.5 tablets (37.5 mg total) by mouth 2 (two) times daily.     warfarin 5 MG tablet  Commonly known as:  COUMADIN  Take 1 tablet (5 mg total) by mouth as directed.           Objective:   Physical Exam BP 118/62 mmHg  Pulse 85  Temp(Src) 98.2 F (36.8 C) (Oral)  Ht 6\' 3"  (1.905 m)  Wt 242 lb 4 oz (109.884 kg)  BMI 30.28 kg/m2  SpO2 97% General:   Well developed, well nourished . NAD.  HEENT:  Normocephalic . Face symmetric, atraumatic Lungs:  CTA B Normal respiratory effort, no intercostal retractions, no accessory muscle use. Heart: Regularly irregular,  no murmur.  +/+++ pretibial edema bilaterally  Skin: Not pale. Not jaundice Neurologic:  alert & oriented X3.  Speech  normal, gait appropriate for age and unassisted Psych--  Cognition and judgment appear intact.  Cooperative with normal attention span and concentration.  Behavior appropriate. No anxious or depressed appearing.      Assessment & Plan:   Assessment> HTN Hyperlipidemia: Has consistently declined to take medications, not checking FLPs BPH CV: --CHF, Cardiomyopathy due to tachycardia: resolved  --h/o A. Fib: declined  Anticoagulation. On amiodaorone Skin cancer: BCC Glaucoma ENDO: --Hyperthyroidism likely induced by Pacerone dx 10-2015 --Thyromegaly: [place] 07/2014, multiple nodules, no biopsy criteria H/o GI bleed: Mallory Wise tear, PRBC 6 H/o Appendicitis 2009 ruptured H/o SBO   PLAN: HTN: Controlled A. fibrillation: diltiazem was dc 2 days ago due to edema, edema has not yet resolved but anticipate it well. Also suspect heart rate will increase, will change metoprolol from 25 bid to 37.5 mg bid, watch closely for low BP - pulse or orthostatic sx. Patient verbalized understanding. Hypothyroidism: per Endocrinology, weight loss and diarrhea resolves RTC 06-2016 CPX

## 2016-03-02 NOTE — Assessment & Plan Note (Signed)
HTN: Controlled A. fibrillation: diltiazem was dc 2 days ago due to edema, edema has not yet resolved but anticipate it well. Also suspect heart rate will increase, will change metoprolol from 25 bid to 37.5 mg bid, watch closely for low BP - pulse or orthostatic sx. Patient verbalized understanding. Hypothyroidism: per Endocrinology, weight loss and diarrhea resolves RTC 06-2016 CPX

## 2016-03-02 NOTE — Patient Instructions (Signed)
   GO TO THE FRONT DESK Schedule your next appointment for a  Physical by 06-2016   Increase metoprolol to 1.5 tablets twice a day   Check the  blood pressure  daily Be sure your blood pressure is between 110/65 and  145/85. If it is consistently higher or lower, let me know   Also check your pulse, if not within 55 and 90 let us know

## 2016-03-02 NOTE — Progress Notes (Signed)
Patient ID: Antonio [name], male   DOB: 09-28-1928, 82 y.o.   MRN: [id number]                                                                                                               Reason for Appointment:  Hyperthyroidism, follow-up  Referring physician: Larose Kells   History of Present Illness:   He was seen in 3/17 with the following clinical picture: For 3 months the patient  had symptoms of decreased appetite, progressive weight loss, tiredness and weakness No usual complaints of any palpitations, shakiness, feeling excessively warm and sweaty or any nervousness He had been on amiodarone since his atrial fibrillation was diagnosed about 7 years ago   He was started on methimazole 10 mg twice a day in 3/17   The starting treatment he has had no problems with weight loss or decreased appetite Recently has normal energy level and no palpitations No cold intolerance His Cardizem was stopped recently because of edema  His methimazole dose was reduced to one tablet daily from twice a day.  He now says that he was taking 2 tablets twice a day and is now taking 1 tablet twice a day on his own  His TSH is now high    Wt Readings from Last 3 Encounters:  03/02/16 243 lb (110.224 kg)  03/02/16 242 lb 4 oz (109.884 kg)  02/29/16 244 lb (110.678 kg)    Lab Results  Component Value Date   FREET4 0.63 02/29/2016   FREET4 0.72 01/27/2016   FREET4 1.37 12/28/2015   T3FREE 2.6 02/29/2016   T3FREE 3.2 01/27/2016   T3FREE 3.8 12/28/2015   TSH 6.51* 02/29/2016   TSH 0.31* 01/27/2016   TSH 0.17* 12/28/2015        Medication List       This list is accurate as of: 03/02/16 11:59 PM.  Always use your most recent med list.               CENTRUM tablet  Take 1 tablet by mouth daily.     dorzolamide 2 % ophthalmic solution  Commonly known as:  TRUSOPT  Place 1 drop into both eyes 2 (two) times daily.     methimazole 10 MG tablet  Commonly known as:  TAPAZOLE  Take 1 tablet  (10 mg total) by mouth 2 (two) times daily.     metoprolol tartrate 25 MG tablet  Commonly known as:  LOPRESSOR  Take 1.5 tablets (37.5 mg total) by mouth 2 (two) times daily.     warfarin 5 MG tablet  Commonly known as:  COUMADIN  Take 1 tablet (5 mg total) by mouth as directed.            Past Medical History  Diagnosis Date  . Hypertension   . Cardiomyopathy     secondary to Tachycardia. A-Acute on chronic admit for CHF 6/08. B- CHF coupled with atrial fib RVR electrophysiology study 09/27/07.  A- Multiple flutters and Atrial fib- no ablation performed b- ibutilide  cardoversion c- amiordarone load- d/c in sinus rhyrthm d- coumadin anticoagulation- stopped at time of GI bleed 11/09  . Mallory - Weiss tear     esophagitis, 6 unit hemorrhage- EPI injections   . Glaucoma   . Atrial fibrillation ([place])   . Appendicitis     SBP 7/09 ruputre w/ RLQ abscess 03/16/08  . Hyperlipidemia   . BPH (benign prostatic hyperplasia) 06/29/2011  . Small bowel obstruction ([place])   . Knee fracture, right 08/2014  . Basal cell carcinoma of nose 2016    Right side nasal tip, [name]    Past Surgical History  Procedure Laterality Date  . Hemorrhoid surgery    . Tee/dccv  02/07/07  . Ep study  09/27/07    see pmh  . Appendectomy      exploratory, ruptured appendix, RLQ abscess  . Polypectomy    . Colonoscopy    . Bcc nose surgery    . Tee without cardioversion N/A 02/08/2016    Procedure: TRANSESOPHAGEAL ECHOCARDIOGRAM (TEE);  Surgeon: Fay Records, MD;  Location: [facility] ENDOSCOPY;  Service: Cardiovascular;  Laterality: N/A;    Family History  Problem Relation Age of Onset  . Uterine cancer Mother   . Colon cancer Neg Hx   . Prostate cancer Neg Hx   . Diabetes Neg Hx   . Heart attack Maternal Uncle   . Heart disease Maternal Uncle     Social History:  reports that he quit smoking about 44 years ago. He has never used smokeless tobacco. He reports that he does not drink alcohol or use illicit  drugs.  Allergies:  Allergies  Allergen Reactions  . Amiodarone Other (See Comments)    Thyroid toxicity     Review of Systems    Examination:   BP 132/86 mmHg  Pulse 99  Ht 6\' 3"  (1.905 m)  Wt 243 lb (110.224 kg)  BMI 30.37 kg/m2  SpO2 95%  He looks well, pleasant  Heart rate about 96, irregular Deep tendon reflexes at biceps are normal No  tremors are present. No diaphoresis   Assessment/Plan:   Hyperthyroidism, from amiodarone along with underlying small multinodular goiter   The patient was somewhat confused about his dosage of methimazole.  He was supposed to be on 10 mg twice a day but he thinks he was taking 20 mg twice a day prior to the last visit He is now mildly hypothyroid with slight increase in TSH Not symptomatic  Heartrate is high because of his atrial fibrillation  He may be getting into remission from his hyperthyroidism. He will reduce his Tapazole from 10 mg twice a day down to 10 mg once a day now in follow-up in 1 month   Arwa Yero 03/03/2016, 7:53 AM

## 2016-03-07 ENCOUNTER — Ambulatory Visit (INDEPENDENT_AMBULATORY_CARE_PROVIDER_SITE_OTHER): Payer: Medicare Other

## 2016-03-07 DIAGNOSIS — I513 Intracardiac thrombosis, not elsewhere classified: Secondary | ICD-10-CM

## 2016-03-07 DIAGNOSIS — Z5181 Encounter for therapeutic drug level monitoring: Secondary | ICD-10-CM

## 2016-03-07 DIAGNOSIS — I482 Chronic atrial fibrillation, unspecified: Secondary | ICD-10-CM

## 2016-03-07 DIAGNOSIS — I4821 Permanent atrial fibrillation: Secondary | ICD-10-CM

## 2016-03-07 DIAGNOSIS — I4891 Unspecified atrial fibrillation: Secondary | ICD-10-CM | POA: Diagnosis not present

## 2016-03-07 DIAGNOSIS — I213 ST elevation (STEMI) myocardial infarction of unspecified site: Secondary | ICD-10-CM | POA: Diagnosis not present

## 2016-03-07 LAB — POCT INR: INR: 1.6

## 2016-03-10 ENCOUNTER — Telehealth (HOSPITAL_COMMUNITY): Payer: Self-pay | Admitting: *Deleted

## 2016-03-10 DIAGNOSIS — I4891 Unspecified atrial fibrillation: Secondary | ICD-10-CM

## 2016-03-10 MED ORDER — METOPROLOL TARTRATE 25 MG PO TABS: 50.0000 mg | ORAL_TABLET | Freq: Two times a day (BID) | ORAL | Status: DC

## 2016-03-10 NOTE — Telephone Encounter (Signed)
Pt cld this morning reporting increase in heart rate to 113 and an elevated bp of 136/109.  He has has a couple med changes recently and wanted to know what should be done.  Per [name], NP pt should increase metoprolol to 50 mg twice a day and follow up with our office next week.  I left a msg on home # for the pt to callback.  I called pt cell number but the vcml box is full.

## 2016-03-10 NOTE — Telephone Encounter (Signed)
Pt cld back and was given instruction on increasing metoprolol to 2 tablets twice a day and he will follow up with our office on Monday for a BP check

## 2016-03-14 ENCOUNTER — Ambulatory Visit (INDEPENDENT_AMBULATORY_CARE_PROVIDER_SITE_OTHER): Payer: Medicare Other | Admitting: Pharmacist

## 2016-03-14 ENCOUNTER — Encounter (HOSPITAL_COMMUNITY): Payer: Self-pay | Admitting: Nurse Practitioner

## 2016-03-14 ENCOUNTER — Ambulatory Visit (HOSPITAL_COMMUNITY)
Admission: RE | Admit: 2016-03-14 | Discharge: 2016-03-14 | Disposition: A | Discharge status: Home / Self Care | Payer: Medicare Other | Source: Ambulatory Visit | Attending: Nurse Practitioner | Admitting: Nurse Practitioner

## 2016-03-14 ENCOUNTER — Encounter (HOSPITAL_COMMUNITY): Payer: Medicare Other | Admitting: Nurse Practitioner

## 2016-03-14 DIAGNOSIS — I482 Chronic atrial fibrillation, unspecified: Secondary | ICD-10-CM

## 2016-03-14 DIAGNOSIS — I4821 Permanent atrial fibrillation: Secondary | ICD-10-CM

## 2016-03-14 DIAGNOSIS — I4891 Unspecified atrial fibrillation: Secondary | ICD-10-CM | POA: Diagnosis not present

## 2016-03-14 DIAGNOSIS — I513 Intracardiac thrombosis, not elsewhere classified: Secondary | ICD-10-CM

## 2016-03-14 DIAGNOSIS — I213 ST elevation (STEMI) myocardial infarction of unspecified site: Secondary | ICD-10-CM

## 2016-03-14 DIAGNOSIS — Z5181 Encounter for therapeutic drug level monitoring: Secondary | ICD-10-CM | POA: Diagnosis not present

## 2016-03-14 LAB — POCT INR: INR: 1.4

## 2016-03-14 NOTE — Progress Notes (Signed)
Patient in for BP check after increased metoprolol dosing for elevated HR/BP. Upon recheck BP 118/76 HR 86. Will continue current dosing for now.

## 2016-03-21 ENCOUNTER — Ambulatory Visit (INDEPENDENT_AMBULATORY_CARE_PROVIDER_SITE_OTHER): Payer: Medicare Other | Admitting: Pharmacist

## 2016-03-21 DIAGNOSIS — Z5181 Encounter for therapeutic drug level monitoring: Secondary | ICD-10-CM | POA: Diagnosis not present

## 2016-03-21 DIAGNOSIS — I4891 Unspecified atrial fibrillation: Secondary | ICD-10-CM | POA: Diagnosis not present

## 2016-03-21 DIAGNOSIS — I513 Intracardiac thrombosis, not elsewhere classified: Secondary | ICD-10-CM

## 2016-03-21 DIAGNOSIS — I482 Chronic atrial fibrillation, unspecified: Secondary | ICD-10-CM

## 2016-03-21 DIAGNOSIS — I213 ST elevation (STEMI) myocardial infarction of unspecified site: Secondary | ICD-10-CM

## 2016-03-21 DIAGNOSIS — I4821 Permanent atrial fibrillation: Secondary | ICD-10-CM

## 2016-03-21 LAB — POCT INR: INR: 2.6

## 2016-03-28 ENCOUNTER — Ambulatory Visit (INDEPENDENT_AMBULATORY_CARE_PROVIDER_SITE_OTHER): Payer: Medicare Other | Admitting: *Deleted

## 2016-03-28 DIAGNOSIS — I513 Intracardiac thrombosis, not elsewhere classified: Secondary | ICD-10-CM

## 2016-03-28 DIAGNOSIS — I213 ST elevation (STEMI) myocardial infarction of unspecified site: Secondary | ICD-10-CM

## 2016-03-28 DIAGNOSIS — Z5181 Encounter for therapeutic drug level monitoring: Secondary | ICD-10-CM | POA: Diagnosis not present

## 2016-03-28 DIAGNOSIS — I4891 Unspecified atrial fibrillation: Secondary | ICD-10-CM | POA: Diagnosis not present

## 2016-03-28 DIAGNOSIS — I4821 Permanent atrial fibrillation: Secondary | ICD-10-CM

## 2016-03-28 DIAGNOSIS — I482 Chronic atrial fibrillation, unspecified: Secondary | ICD-10-CM

## 2016-03-28 LAB — POCT INR: INR: 5

## 2016-04-01 ENCOUNTER — Other Ambulatory Visit (INDEPENDENT_AMBULATORY_CARE_PROVIDER_SITE_OTHER): Payer: Medicare Other

## 2016-04-01 DIAGNOSIS — E0580 Other thyrotoxicosis without thyrotoxic crisis or storm: Secondary | ICD-10-CM | POA: Diagnosis not present

## 2016-04-01 DIAGNOSIS — T462X5A Adverse effect of other antidysrhythmic drugs, initial encounter: Secondary | ICD-10-CM

## 2016-04-01 LAB — T4, FREE: Free T4: 0.72 ng/dL (ref 0.60–1.60)

## 2016-04-01 LAB — TSH: TSH: 11.31 u[IU]/mL — ABNORMAL HIGH (ref 0.35–4.50)

## 2016-04-04 ENCOUNTER — Ambulatory Visit (INDEPENDENT_AMBULATORY_CARE_PROVIDER_SITE_OTHER): Payer: Medicare Other | Admitting: Pharmacist

## 2016-04-04 ENCOUNTER — Ambulatory Visit (INDEPENDENT_AMBULATORY_CARE_PROVIDER_SITE_OTHER): Payer: Medicare Other | Admitting: Endocrinology

## 2016-04-04 ENCOUNTER — Encounter: Payer: Self-pay | Admitting: Endocrinology

## 2016-04-04 VITALS — BP 138/80 | HR 113 | Ht 75.0 in | Wt 246.0 lb

## 2016-04-04 DIAGNOSIS — I213 ST elevation (STEMI) myocardial infarction of unspecified site: Secondary | ICD-10-CM | POA: Diagnosis not present

## 2016-04-04 DIAGNOSIS — I482 Chronic atrial fibrillation, unspecified: Secondary | ICD-10-CM

## 2016-04-04 DIAGNOSIS — Z5181 Encounter for therapeutic drug level monitoring: Secondary | ICD-10-CM

## 2016-04-04 DIAGNOSIS — I4821 Permanent atrial fibrillation: Secondary | ICD-10-CM

## 2016-04-04 DIAGNOSIS — E0580 Other thyrotoxicosis without thyrotoxic crisis or storm: Secondary | ICD-10-CM

## 2016-04-04 DIAGNOSIS — I513 Intracardiac thrombosis, not elsewhere classified: Secondary | ICD-10-CM

## 2016-04-04 LAB — POCT INR: INR: 3.2

## 2016-04-04 NOTE — Progress Notes (Signed)
Patient ID: Antonio [name], male   DOB: 13-Aug-1929, 82 y.o.   MRN: [id number]                                                                                                               Reason for Appointment:  Hyperthyroidism, follow-up  Referring physician: Larose Kells   History of Present Illness:   He was seen in 3/17 with the following clinical picture: For 3 months the patient  had symptoms of decreased appetite, progressive weight loss, tiredness and weakness No usual complaints of any palpitations, shakiness, feeling excessively warm and sweaty or any nervousness He had been on amiodarone since his atrial fibrillation was diagnosed about 7 years ago   He was started on methimazole 10 mg twice a day in 3/17   After starting treatment he has had no problems with weight loss or decreased appetite His methimazole dose was reduced to one tablet daily from twice a day in July because of TSH of 6.5  Recently has normal energy level, only complaint is some shortness of breath No cold intolerance Has mild increase in weight  His TSH is now persistently high    Wt Readings from Last 3 Encounters:  04/04/16 246 lb (111.6 kg)  03/02/16 243 lb (110.2 kg)  03/02/16 242 lb 4 oz (109.9 kg)    Lab Results  Component Value Date   FREET4 0.72 04/01/2016   FREET4 0.63 02/29/2016   FREET4 0.72 01/27/2016   T3FREE 2.6 02/29/2016   T3FREE 3.2 01/27/2016   T3FREE 3.8 12/28/2015   TSH 11.31 (H) 04/01/2016   TSH 6.51 (H) 02/29/2016   TSH 0.31 (L) 01/27/2016        Medication List       Accurate as of 04/04/16  1:14 PM. Always use your most recent med list.          CENTRUM tablet Take 1 tablet by mouth daily.   dorzolamide 2 % ophthalmic solution Commonly known as:  TRUSOPT Place 1 drop into both eyes 2 (two) times daily.   methimazole 10 MG tablet Commonly known as:  TAPAZOLE Take 1 tablet (10 mg total) by mouth 2 (two) times daily.   metoprolol tartrate 25 MG  tablet Commonly known as:  LOPRESSOR Take 2 tablets (50 mg total) by mouth 2 (two) times daily.   warfarin 5 MG tablet Commonly known as:  COUMADIN Take 1 tablet (5 mg total) by mouth as directed.           Past Medical History:  Diagnosis Date  . Appendicitis    SBP 7/09 ruputre w/ RLQ abscess 03/16/08  . Atrial fibrillation ([place])   . Basal cell carcinoma of nose 2016   Right side nasal tip, [name]  . BPH (benign prostatic hyperplasia) 06/29/2011  . Cardiomyopathy    secondary to Tachycardia. A-Acute on chronic admit for CHF 6/08. B- CHF coupled with atrial fib RVR electrophysiology study 09/27/07.  A- Multiple flutters and Atrial fib- no ablation performed b- ibutilide cardoversion c-  amiordarone load- d/c in sinus rhyrthm d- coumadin anticoagulation- stopped at time of GI bleed 11/09  . Glaucoma   . Hyperlipidemia   . Hypertension   . Knee fracture, right 08/2014  . Mallory - Weiss tear    esophagitis, 6 unit hemorrhage- EPI injections   . Small bowel obstruction [facility])     Past Surgical History:  Procedure Laterality Date  . APPENDECTOMY     exploratory, ruptured appendix, RLQ abscess  . BCC nose surgery    . COLONOSCOPY    . EP study  09/27/07   see pmh  . HEMORRHOID SURGERY    . POLYPECTOMY    . TEE WITHOUT CARDIOVERSION N/A 02/08/2016   Procedure: TRANSESOPHAGEAL ECHOCARDIOGRAM (TEE);  Surgeon: Fay Records, MD;  Location: [facility] ENDOSCOPY;  Service: Cardiovascular;  Laterality: N/A;  . TEE/DCCV  02/07/07    Family History  Problem Relation Age of Onset  . Uterine cancer Mother   . Colon cancer Neg Hx   . Prostate cancer Neg Hx   . Diabetes Neg Hx   . Heart attack Maternal Uncle   . Heart disease Maternal Uncle     Social History:  reports that he quit smoking about 44 years ago. He has never used smokeless tobacco. He reports that he does not drink alcohol or use drugs.  Allergies:  Allergies  Allergen Reactions  . Amiodarone Other (See Comments)    Thyroid  toxicity     Review of Systems    Examination:   BP 138/80   Pulse (!) 113   Ht 6\' 3"  (1.905 m)   Wt 246 lb (111.6 kg)   SpO2 96%   BMI 30.75 kg/m   He looks well, pleasant, not dyspneic Firm and nodular thyroid felt on the right side on swallowing, about twice normal, just palpable on the left also   Heart rate about 110, irregular Deep tendon reflexes at biceps are normal but difficult to elicit In the patient is normal Trace ankle edema present   Assessment/Plan:   Hyperthyroidism, from amiodarone along with underlying small multinodular goiter      He appears to be requiring progressively lower doses of methimazole now Although he is asymptomatic even with taking 10 mg methimazole his TSH has increased further  Heart rate is high because of his atrial fibrillation and not taking the full prescribed dose of metoprolol  He is likely to be in remission from his hyperthyroidism. He will stop his methimazole for now and follow-up in 1 month with labs  Atrial fibrillation: Advised him to not reduce his metoprolol which he is doing and to go back to 50 mg twice a day   Loy Mccartt 04/04/2016, 1:14 PM

## 2016-04-04 NOTE — Patient Instructions (Signed)
Stop METHIMALZOLE

## 2016-04-06 ENCOUNTER — Ambulatory Visit (HOSPITAL_COMMUNITY)
Admission: RE | Admit: 2016-04-06 | Discharge: 2016-04-06 | Disposition: A | Discharge status: Home / Self Care | Payer: Medicare Other | Source: Ambulatory Visit | Attending: Nurse Practitioner | Admitting: Nurse Practitioner

## 2016-04-06 ENCOUNTER — Encounter (HOSPITAL_COMMUNITY): Payer: Self-pay | Admitting: Nurse Practitioner

## 2016-04-06 DIAGNOSIS — M8580 Other specified disorders of bone density and structure, unspecified site: Secondary | ICD-10-CM | POA: Diagnosis not present

## 2016-04-06 DIAGNOSIS — R0602 Shortness of breath: Secondary | ICD-10-CM

## 2016-04-06 DIAGNOSIS — I517 Cardiomegaly: Secondary | ICD-10-CM | POA: Diagnosis not present

## 2016-04-06 DIAGNOSIS — I4510 Unspecified right bundle-branch block: Secondary | ICD-10-CM | POA: Diagnosis not present

## 2016-04-06 DIAGNOSIS — R918 Other nonspecific abnormal finding of lung field: Secondary | ICD-10-CM | POA: Diagnosis not present

## 2016-04-06 DIAGNOSIS — I4891 Unspecified atrial fibrillation: Secondary | ICD-10-CM | POA: Diagnosis not present

## 2016-04-06 LAB — BASIC METABOLIC PANEL
ANION GAP: 9 (ref 5–15)
BUN: 15 mg/dL (ref 6–20)
CALCIUM: 10 mg/dL (ref 8.9–10.3)
CO2: 25 mmol/L (ref 22–32)
CREATININE: 1.07 mg/dL (ref 0.61–1.24)
Chloride: 106 mmol/L (ref 101–111)
Glucose, Bld: 117 mg/dL — ABNORMAL HIGH (ref 65–99)
Potassium: 4.3 mmol/L (ref 3.5–5.1)
SODIUM: 140 mmol/L (ref 135–145)

## 2016-04-06 LAB — CBC
HCT: 45.8 % (ref 39.0–52.0)
Hemoglobin: 14.9 g/dL (ref 13.0–17.0)
MCH: 31 pg (ref 26.0–34.0)
MCHC: 32.5 g/dL (ref 30.0–36.0)
MCV: 95.2 fL (ref 78.0–100.0)
PLATELETS: 201 10*3/uL (ref 150–400)
RBC: 4.81 MIL/uL (ref 4.22–5.81)
RDW: 15 % (ref 11.5–15.5)
WBC: 6.7 10*3/uL (ref 4.0–10.5)

## 2016-04-06 LAB — MAGNESIUM: MAGNESIUM: 2 mg/dL (ref 1.7–2.4)

## 2016-04-06 MED ORDER — DILTIAZEM HCL ER COATED BEADS 120 MG PO CP24: 120.0000 mg | ORAL_CAPSULE | Freq: Every day | ORAL | 3 refills | Status: DC

## 2016-04-06 NOTE — Patient Instructions (Signed)
Your physician has recommended you make the following change in your medication:  1)Restart Cardizem 120mg  once a day    TEE scheduled for Monday, August 21st  - Arrive at the [facility] and go to admitting at 12PM  -Do not eat or drink anything after midnight the night prior to your procedure.  - Take all your medication with a sip of water prior to arrival.  - You will not be able to drive home after your procedure.

## 2016-04-06 NOTE — Progress Notes (Addendum)
Patient ID: Antonio MARKE, male   DOB: [birth year], 82 y.o.   MRN: [id number]     Primary Care Physician: Antonio November, MD Referring Physician: [name], PA Electrophysiologist: Antonio [name] Cardiologist: None   Antonio [name] is a 82 y.o. male with a h/o afib that has maintained SR on amiodarone x 7-8 years. Prior to that had [place] which normalized with return of SR.  While visiting with his daughter several weeks ago in [place], [place], he had a syncopal episode. He was hospitalized at [facility] was found to hyperthyroid with afib RVR.Amiodarone was d/ced. He was off anticoagulation due to remote  GI bleed and Antonio [name]'s desires  not to resume blood thinners. Plans were made for direct current cardioversion, however a TEE was performed which showed a left atrial thrombus. Left ventricular ejection fraction was normal at 55-60%.Chadsvasc score is at least 3. There were no significant valve abnormalities. Due to visualization of the left atrial thrombus, his cardioversion was canceled. He was placed on anticoagulation therapy with Eliquis, 5 mg twice a day. He was treated with rate control agents, Cardizem 120 mg in addition to Metroprolol 12.5 mg twice a day. He was instructed to continue aspirin along with Eliquis.  He was seen by Antonio [name], Antonio [name] 5/8 and referred here for further addressing of restoring SR with AAD's and repeat cardioversion when  thought to be safe to resume when thyroid  normalized and thrombus dissolved. Antonio [name] did not sign up for Medicare D at 57 and currently has no drug coverage. Daughter is trying to get him signed up for drug coverage but it will not be before October. If Antonio [name] is used, cost may be a big concern, unless Antonio [name] qualifies for Antonio [name] assistance. QTc today is 523 ms,but RBBB is contributing.Qtc in SR around 440 ms. Last TSH drawn yesterday shows that thyroid is improving off amiodarone and on methimazole 10 mg daily. TSH 0.07 3 months ago and 6/7 0.31 and  methimazole reduced to 1 tab daily by Antonio [name].   Antonio [name] is symptomatic with Afib and is c/o fatigue and shortness of breath with activity. No PND, orthopnea, appears normvolemic. Off amiodarone around 6 weeks now. Rate control is reasonable and reports HR in 80's/low 90's at home. On BB/CCB.  Returns 7/10, he was set up for Tikosyn, hospitalization preceded with TEE to make sure left thrombus was resolved. Unfortunately, It was not. Plans for tikosyn were postponed and decision was made to start coumadin, stop DOAC. He has been on coumadin for 3 weeks, but has only had one INR that was therapeutic and last one was 1.3. He will have to have 3-4 weeks of therapeutic INR's before repeat TEE can be entertained. He has had lower extremity swelling which he thinks has come about since starting diltiazem. Since his thyroid is normalizing off amiodarone, his HR is better controlled in the 60's and may be able to do without cardizem now. Tolerating afib better at slower v rate.  He returns afib clinic 8/16 and has had 3 successive weekly therapeutic INR's and is pending the 4th INR on Monday. If therapeutic, plans are for TEE and admission for tikosyn that same day. He is now off methimazole, stopped when he saw Antonio [name] 8/14. Now TSH is chronically high. Antonio [name] is c/o of fatigue and shortness of breath. He has RVR today at 122 bpm. LLE did not improve with stopping Cardizem in July. Weight is up gradually 6 lbs since early July ,  I think this may be weight gain and not fluid since when he developed hyperthyroidism, his weight dropped to 228 from 260. He is not admitting to PND/orthopnea.   Today, he denies symptoms of  chest pain,  orthopnea, PND, lower extremity edema, dizziness, presyncope, syncope, or neurologic sequela. Positive for shortness of breath, palpitations, fatigue The patient is tolerating medications without difficulties and is otherwise without complaint today.   Past Medical History:  Diagnosis Date    . Appendicitis    SBP 7/09 ruputre w/ RLQ abscess 03/16/08  . Atrial fibrillation ([place])   . Basal cell carcinoma of nose 2016   Right side nasal tip, Antonio [name]  . BPH (benign prostatic hyperplasia) 06/29/2011  . Cardiomyopathy    secondary to Tachycardia. A-Acute on chronic admit for CHF 6/08. B- CHF coupled with atrial fib RVR electrophysiology study 09/27/07.  A- Multiple flutters and Atrial fib- no ablation performed b- ibutilide cardoversion c- amiordarone load- d/c in sinus rhyrthm d- coumadin anticoagulation- stopped at time of GI bleed 11/09  . Glaucoma   . Hyperlipidemia   . Hypertension   . Knee fracture, right 08/2014  . Mallory - Weiss tear    esophagitis, 6 unit hemorrhage- EPI injections   . Small bowel obstruction [facility])    Past Surgical History:  Procedure Laterality Date  . APPENDECTOMY     exploratory, ruptured appendix, RLQ abscess  . BCC nose surgery    . COLONOSCOPY    . EP study  09/27/07   see pmh  . HEMORRHOID SURGERY    . POLYPECTOMY    . TEE WITHOUT CARDIOVERSION N/A 02/08/2016   Procedure: TRANSESOPHAGEAL ECHOCARDIOGRAM (TEE);  Surgeon: Fay Records, MD;  Location: [facility] ENDOSCOPY;  Service: Cardiovascular;  Laterality: N/A;  . TEE/DCCV  02/07/07    Current Outpatient Prescriptions  Medication Sig Dispense Refill  . dorzolamide (TRUSOPT) 2 % ophthalmic solution Place 1 drop into both eyes 2 (two) times daily.      . metoprolol tartrate (LOPRESSOR) 25 MG tablet Take 2 tablets (50 mg total) by mouth 2 (two) times daily. 90 tablet 6  . Multiple Vitamins-Minerals (CENTRUM) tablet Take 1 tablet by mouth daily.      Marland Kitchen warfarin (COUMADIN) 5 MG tablet Take 1 tablet (5 mg total) by mouth as directed. 30 tablet 3  . diltiazem (CARDIZEM CD) 120 MG 24 hr capsule Take 1 capsule (120 mg total) by mouth daily. 90 capsule 3   No current facility-administered medications for this encounter.     Allergies  Allergen Reactions  . Amiodarone Other (See Comments)    Thyroid  toxicity    Social History   Social History  . Marital status: Married    Spouse name: N/A  . Number of children: 3  . Years of education: N/A   Occupational History  . Cabinetmaker     works part time   Social History Main Topics  . Smoking status: Former Smoker    Quit date: 06/30/1971  . Smokeless tobacco: Never Used     Comment: quit tobacco remotely , ~ 1 ppd  . Alcohol use No  . Drug use: No  . Sexual activity: Not on file   Other Topics Concern  . Not on file   Social History Narrative   Household-- Antonio [name] and Antonio [name] (Antonio [name] developing dementia)   One son lives in [place], goes there from time to time  Family History  Problem Relation Age of Onset  . Uterine cancer Mother   . Colon cancer Neg Hx   . Prostate cancer Neg Hx   . Diabetes Neg Hx   . Heart attack Maternal Uncle   . Heart disease Maternal Uncle     ROS- All systems are reviewed and negative except as per the HPI above  Physical Exam: Vitals:   04/06/16 1427  BP: (!) 162/108  Pulse: (!) 122  Weight: 250 lb 12.8 oz (113.8 kg)  Height: 6\' 3"  (1.905 m)    GEN- The patient is well appearing, alert and oriented x 3 today.   Head- normocephalic, atraumatic Eyes-  Sclera clear, conjunctiva pink Ears- hearing intact Oropharynx- clear Neck- supple, no JVP Lymph- no cervical lymphadenopathy Lungs- Clear to ausculation bilaterally, normal work of breathing Heart- Irregular rate and rhythm, no murmurs, rubs or gallops, PMI not laterally displaced GI- soft, NT, ND, + BS Extremities- no clubbing, cyanosis,  2+ bilateral edema MS- no significant deformity or atrophy Skin- no rash or lesion Psych- euthymic mood, full affect Neuro- strength and sensation are intact  EKG-Afib with v rate 64 bpm,  RBBB, qrs int 150 ms, Qtc 447 ms INR's 7/31- 2.6, 8/7-5.0, 8/14-3.2, 8/21- pending per coumadin clinic  Assessment and Plan: 1.Persistent symptomatic  afib secondary to thyroid toxicity with  amiodarone use Remains in afib, v rate is elevated today, Antonio [name] feels more dyspneic,  will restart cardizem 120 mg daily for better rate control Continue with metoprolol  CXR today, if shows edema, will start lasix for a few days prior to TEE  2. Hyperthyroidism Off methimazole since Monday Per Endocrinologist  3.Persisitent left atrial thrombus Off [place], on coumadin Continue coumadin checks with coumadin clinic Antonio [name] will report for 4th weekly INR on Monday and if therapeutic, plans are for repeat TEE Monday at 1pm, and if no thrombus, admit for tikosyn load Bmet/mag today   Addendum- 4pm- 8/16- CXR did not show any edema, but showed emphysema, subtle nodularity in lung bases and mid upper lung, suggested f/u unenhanced CT. Hopefully this can be arranged if/when hospitalized for tikosyn.  labs- creat 1.07,  K+ 4.3 and mag at 2.0.   Antonio [name], [facility] [address] [place], [place] [address] [phone]

## 2016-04-06 NOTE — Addendum Note (Signed)
Encounter addended by: Sherran Needs, NP on: 04/06/2016  4:12 PM<BR>    Actions taken: Sign clinical note

## 2016-04-11 ENCOUNTER — Encounter (HOSPITAL_COMMUNITY)
Admission: RE | Disposition: A | Discharge status: Home / Self Care | Payer: Self-pay | Source: Ambulatory Visit | Attending: Internal Medicine

## 2016-04-11 ENCOUNTER — Ambulatory Visit (INDEPENDENT_AMBULATORY_CARE_PROVIDER_SITE_OTHER): Payer: Medicare Other | Admitting: Pharmacist

## 2016-04-11 ENCOUNTER — Ambulatory Visit (HOSPITAL_COMMUNITY): Payer: Medicare Other | Admitting: Nurse Practitioner

## 2016-04-11 ENCOUNTER — Ambulatory Visit (HOSPITAL_COMMUNITY)
Admission: RE | Admit: 2016-04-11 | Discharge: 2016-04-11 | Disposition: A | Discharge status: Home / Self Care | Payer: Medicare Other | Source: Ambulatory Visit | Attending: Nurse Practitioner | Admitting: Nurse Practitioner

## 2016-04-11 ENCOUNTER — Inpatient Hospital Stay (HOSPITAL_COMMUNITY)
Admission: RE | Admit: 2016-04-11 | Discharge: 2016-04-14 | DRG: 310 | Disposition: A | Discharge status: Home / Self Care | Payer: Medicare Other | Source: Ambulatory Visit | Attending: Internal Medicine | Admitting: Internal Medicine

## 2016-04-11 ENCOUNTER — Encounter (HOSPITAL_COMMUNITY): Payer: Self-pay | Admitting: *Deleted

## 2016-04-11 VITALS — Wt 243.0 lb

## 2016-04-11 DIAGNOSIS — E785 Hyperlipidemia, unspecified: Secondary | ICD-10-CM | POA: Diagnosis present

## 2016-04-11 DIAGNOSIS — I4891 Unspecified atrial fibrillation: Secondary | ICD-10-CM | POA: Diagnosis not present

## 2016-04-11 DIAGNOSIS — Z85828 Personal history of other malignant neoplasm of skin: Secondary | ICD-10-CM | POA: Diagnosis not present

## 2016-04-11 DIAGNOSIS — I10 Essential (primary) hypertension: Secondary | ICD-10-CM | POA: Diagnosis not present

## 2016-04-11 DIAGNOSIS — H409 Unspecified glaucoma: Secondary | ICD-10-CM | POA: Diagnosis present

## 2016-04-11 DIAGNOSIS — N400 Enlarged prostate without lower urinary tract symptoms: Secondary | ICD-10-CM | POA: Diagnosis present

## 2016-04-11 DIAGNOSIS — R918 Other nonspecific abnormal finding of lung field: Secondary | ICD-10-CM

## 2016-04-11 DIAGNOSIS — I4821 Permanent atrial fibrillation: Secondary | ICD-10-CM

## 2016-04-11 DIAGNOSIS — R000 Tachycardia, unspecified: Secondary | ICD-10-CM | POA: Diagnosis present

## 2016-04-11 DIAGNOSIS — I4510 Unspecified right bundle-branch block: Secondary | ICD-10-CM | POA: Diagnosis present

## 2016-04-11 DIAGNOSIS — I429 Cardiomyopathy, unspecified: Secondary | ICD-10-CM | POA: Diagnosis present

## 2016-04-11 DIAGNOSIS — I481 Persistent atrial fibrillation: Secondary | ICD-10-CM | POA: Diagnosis not present

## 2016-04-11 DIAGNOSIS — I513 Intracardiac thrombosis, not elsewhere classified: Secondary | ICD-10-CM

## 2016-04-11 DIAGNOSIS — I213 ST elevation (STEMI) myocardial infarction of unspecified site: Secondary | ICD-10-CM

## 2016-04-11 DIAGNOSIS — I4819 Other persistent atrial fibrillation: Secondary | ICD-10-CM | POA: Diagnosis present

## 2016-04-11 DIAGNOSIS — J439 Emphysema, unspecified: Secondary | ICD-10-CM | POA: Diagnosis present

## 2016-04-11 DIAGNOSIS — I340 Nonrheumatic mitral (valve) insufficiency: Secondary | ICD-10-CM

## 2016-04-11 DIAGNOSIS — Z87891 Personal history of nicotine dependence: Secondary | ICD-10-CM | POA: Diagnosis not present

## 2016-04-11 DIAGNOSIS — E0590 Thyrotoxicosis, unspecified without thyrotoxic crisis or storm: Secondary | ICD-10-CM | POA: Diagnosis present

## 2016-04-11 DIAGNOSIS — I482 Chronic atrial fibrillation, unspecified: Secondary | ICD-10-CM

## 2016-04-11 DIAGNOSIS — E876 Hypokalemia: Secondary | ICD-10-CM | POA: Diagnosis not present

## 2016-04-11 DIAGNOSIS — Z5181 Encounter for therapeutic drug level monitoring: Secondary | ICD-10-CM | POA: Diagnosis not present

## 2016-04-11 HISTORY — DX: Other nonspecific abnormal finding of lung field: R91.8

## 2016-04-11 HISTORY — DX: Personal history of other medical treatment: Z92.89

## 2016-04-11 HISTORY — DX: Anemia, unspecified: D64.9

## 2016-04-11 HISTORY — PX: TEE WITHOUT CARDIOVERSION: SHX5443

## 2016-04-11 LAB — BASIC METABOLIC PANEL
Anion gap: 5 (ref 5–15)
BUN: 13 mg/dL (ref 6–20)
CALCIUM: 9.7 mg/dL (ref 8.9–10.3)
CO2: 27 mmol/L (ref 22–32)
CREATININE: 1.06 mg/dL (ref 0.61–1.24)
Chloride: 108 mmol/L (ref 101–111)
Glucose, Bld: 108 mg/dL — ABNORMAL HIGH (ref 65–99)
Potassium: 4.5 mmol/L (ref 3.5–5.1)
SODIUM: 140 mmol/L (ref 135–145)

## 2016-04-11 LAB — MAGNESIUM: MAGNESIUM: 2 mg/dL (ref 1.7–2.4)

## 2016-04-11 LAB — POCT INR: INR: 4.1

## 2016-04-11 SURGERY — ECHOCARDIOGRAM, TRANSESOPHAGEAL
Anesthesia: Moderate Sedation

## 2016-04-11 MED ORDER — METOPROLOL TARTRATE 50 MG PO TABS
50.0000 mg | ORAL_TABLET | Freq: Two times a day (BID) | ORAL | Status: DC
  Administered 2016-04-11 – 2016-04-14 (×6): 50 mg via ORAL
  Filled 2016-04-11 (×6): qty 1

## 2016-04-11 MED ORDER — SODIUM CHLORIDE 0.9% FLUSH
3.0000 mL | Freq: Two times a day (BID) | INTRAVENOUS | Status: DC
  Administered 2016-04-11 – 2016-04-13 (×4): 3 mL via INTRAVENOUS

## 2016-04-11 MED ORDER — BUTAMBEN-TETRACAINE-BENZOCAINE 2-2-14 % EX AERO
INHALATION_SPRAY | CUTANEOUS | Status: DC | PRN
  Administered 2016-04-11: 2 via TOPICAL

## 2016-04-11 MED ORDER — DOFETILIDE 500 MCG PO CAPS
500.0000 ug | ORAL_CAPSULE | Freq: Two times a day (BID) | ORAL | Status: DC
  Administered 2016-04-11 – 2016-04-12 (×3): 500 ug via ORAL
  Filled 2016-04-11 (×5): qty 1

## 2016-04-11 MED ORDER — SODIUM CHLORIDE 0.9 % IV SOLN
INTRAVENOUS | Status: DC
  Administered 2016-04-11: 500 mL via INTRAVENOUS

## 2016-04-11 MED ORDER — MIDAZOLAM HCL 10 MG/2ML IJ SOLN
INTRAMUSCULAR | Status: DC | PRN
  Administered 2016-04-11 (×2): 2 mg via INTRAVENOUS

## 2016-04-11 MED ORDER — PERFLUTREN LIPID MICROSPHERE
INTRAVENOUS | Status: AC
  Filled 2016-04-11: qty 10

## 2016-04-11 MED ORDER — MIDAZOLAM HCL 5 MG/ML IJ SOLN
INTRAMUSCULAR | Status: AC
  Filled 2016-04-11: qty 2

## 2016-04-11 MED ORDER — DILTIAZEM HCL ER COATED BEADS 120 MG PO CP24
120.0000 mg | ORAL_CAPSULE | Freq: Every day | ORAL | Status: DC
  Administered 2016-04-12 – 2016-04-14 (×3): 120 mg via ORAL
  Filled 2016-04-11 (×3): qty 1

## 2016-04-11 MED ORDER — WARFARIN - PHARMACIST DOSING INPATIENT: Freq: Every day | Status: DC

## 2016-04-11 MED ORDER — PERFLUTREN LIPID MICROSPHERE
INTRAVENOUS | Status: DC | PRN
  Administered 2016-04-11: 2 mL via INTRAVENOUS

## 2016-04-11 MED ORDER — FENTANYL CITRATE (PF) 100 MCG/2ML IJ SOLN
INTRAMUSCULAR | Status: DC | PRN
  Administered 2016-04-11: 50 ug via INTRAVENOUS

## 2016-04-11 MED ORDER — DORZOLAMIDE HCL 2 % OP SOLN
1.0000 [drp] | Freq: Two times a day (BID) | OPHTHALMIC | Status: DC
  Administered 2016-04-11 – 2016-04-14 (×6): 1 [drp] via OPHTHALMIC
  Filled 2016-04-11: qty 10

## 2016-04-11 MED ORDER — SODIUM CHLORIDE 0.9 % IV SOLN: 250.0000 mL | INTRAVENOUS | Status: DC | PRN

## 2016-04-11 MED ORDER — SODIUM CHLORIDE 0.9% FLUSH
3.0000 mL | INTRAVENOUS | Status: DC | PRN
  Administered 2016-04-11: 3 mL via INTRAVENOUS
  Filled 2016-04-11: qty 3

## 2016-04-11 MED ORDER — FENTANYL CITRATE (PF) 100 MCG/2ML IJ SOLN
INTRAMUSCULAR | Status: AC
  Filled 2016-04-11: qty 2

## 2016-04-11 NOTE — CV Procedure (Signed)
See full note in [employer] Dense spontaneous contrast in LA/LAA No thrombus Will be admitted for [facility]

## 2016-04-11 NOTE — Interval H&P Note (Signed)
History and Physical Interval Note:  04/11/2016 1:01 PM  Antonio [name]  has presented today for surgery, with the diagnosis of AFIB  The various methods of treatment have been discussed with the patient and family. After consideration of risks, benefits and other options for treatment, the patient has consented to  Procedure(s): TRANSESOPHAGEAL ECHOCARDIOGRAM (TEE) (N/A) as a surgical intervention .  The patient's history has been reviewed, patient examined, no change in status, stable for surgery.  I have reviewed the patient's chart and labs.  Questions were answered to the patient's satisfaction.     Jenkins Rouge

## 2016-04-11 NOTE — H&P (Signed)
ELECTROPHYSIOLOGY HISTORY AND PHYSICAL    Patient ID: Antonio [name] MRN: [id number], DOB/AGE: 80-Oct-1930 80 y.o.  Admit date: 04/11/2016 Date of Consult: 04/11/2016  Primary Physician: Kathlene November, MD Primary Cardiologist: Caryl Comes  CC: here for Tikosyn load  HPI:  Antonio [name] is a 82 y.o. male with a past medical history significant for persistent atrial fibrillation, hypertension, tachycardia induced cardiomyopathy, and prior GI bleed 2/2 Mallory-Weiss tear.  He was previously maintained on amiodarone but was found to be hyperthyroid and amiodarone was discontinued.  He had recurrent atrial fibrillation off of amiodarone and DCCV was pursued.  TEE showed left atrial thrombus and he was continued on rate control until today.  He has been seen by Dr Caryl Comes and [name], NP in the AF clinic and decision was made to proceed with Tikosyn load.  Amiodarone level 0.3 on 01/28/16.  He reported compliance with Eliquis prior to repeat TEE 02/08/16 but at that time was felt that thrombus has not resolved and Tikosyn admission was cancelled.  He was switched from Eliquis to Warfarin and has been followed with weekly INR's with plan today for repeat TEE and Tikosyn admission.   He remains weak and fatigued and also has shortness of breath with exertion but denies chest pain, LE edema, recent fevers, chills, nausea or vomiting.   Past Medical History:  Diagnosis Date  . Appendicitis    SBP 7/09 ruputre w/ RLQ abscess 03/16/08  . Atrial fibrillation ([place])   . Basal cell carcinoma of nose 2016   Right side nasal tip, [name]  . BPH (benign prostatic hyperplasia) 06/29/2011  . Cardiomyopathy    secondary to Tachycardia. A-Acute on chronic admit for CHF 6/08. B- CHF coupled with atrial fib RVR electrophysiology study 09/27/07.  A- Multiple flutters and Atrial fib- no ablation performed b- ibutilide cardoversion c- amiordarone load- d/c in sinus rhyrthm d- coumadin anticoagulation- stopped at time of GI bleed  11/09  . Glaucoma   . Hyperlipidemia   . Hypertension   . Knee fracture, right 08/2014  . Mallory - Weiss tear    esophagitis, 6 unit hemorrhage- EPI injections   . Small bowel obstruction [facility])      Surgical History:  Past Surgical History:  Procedure Laterality Date  . APPENDECTOMY     exploratory, ruptured appendix, RLQ abscess  . BCC nose surgery    . COLONOSCOPY    . EP study  09/27/07   see pmh  . HEMORRHOID SURGERY    . POLYPECTOMY    . TEE WITHOUT CARDIOVERSION N/A 02/08/2016   Procedure: TRANSESOPHAGEAL ECHOCARDIOGRAM (TEE);  Surgeon: Fay Records, MD;  Location: [facility] ENDOSCOPY;  Service: Cardiovascular;  Laterality: N/A;  . TEE/DCCV  02/07/07     Prescriptions Prior to Admission  Medication Sig Dispense Refill Last Dose  . diltiazem (CARDIZEM CD) 120 MG 24 hr capsule Take 1 capsule (120 mg total) by mouth daily. 90 capsule 3 04/11/2016 at 1130  . dorzolamide (TRUSOPT) 2 % ophthalmic solution Place 1 drop into both eyes 2 (two) times daily.     04/10/2016 at Unknown time  . metoprolol tartrate (LOPRESSOR) 25 MG tablet Take 2 tablets (50 mg total) by mouth 2 (two) times daily. 90 tablet 6 04/11/2016 at 1130  . Multiple Vitamins-Minerals (CENTRUM) tablet Take 1 tablet by mouth daily.     04/10/2016 at Unknown time  . warfarin (COUMADIN) 5 MG tablet Take 1 tablet (5 mg total) by mouth as directed. 30 tablet  3 04/10/2016 at Unknown time    Inpatient Medications:    Allergies:  Allergies  Allergen Reactions  . Amiodarone Other (See Comments)    Thyroid toxicity    Social History   Social History  . Marital status: Married    Spouse name: N/A  . Number of children: 3  . Years of education: N/A   Occupational History  . Cabinetmaker     works part time   Social History Main Topics  . Smoking status: Former Smoker    Quit date: 06/30/1971  . Smokeless tobacco: Never Used     Comment: quit tobacco remotely , ~ 1 ppd  . Alcohol use No  . Drug use: No  . Sexual  activity: Not on file   Other Topics Concern  . Not on file   Social History Narrative   Household-- pt and wife (wife developing dementia)   One son lives in [place], goes there from time to time               Family History  Problem Relation Age of Onset  . Uterine cancer Mother   . Heart attack Maternal Uncle   . Heart disease Maternal Uncle   . Colon cancer Neg Hx   . Prostate cancer Neg Hx   . Diabetes Neg Hx      Review of Systems: All other systems reviewed and are otherwise negative except as noted above.  Physical Exam: Vitals:   04/11/16 1204 04/11/16 1205 04/11/16 1219  BP: (!) 170/126 (!) 151/125 (!) 132/104  Resp: 16    Temp: 97.7 F (36.5 C)    TempSrc: Oral    SpO2: 98%      GEN- The patient is elderly appearing, alert and oriented x 3 today.   HEENT: normocephalic, atraumatic; sclera clear, conjunctiva pink; hearing intact; oropharynx clear; neck supple  Lungs- Clear to ausculation bilaterally, normal work of breathing.  No wheezes, rales, rhonchi Heart- Irregular rate and rhythm, no murmurs, rubs or gallops  GI- soft, non-tender, non-distended, bowel sounds present  Extremities- no clubbing, cyanosis, or edema; DP/PT/radial pulses 2+ bilaterally MS- no significant deformity or atrophy Skin- warm and dry, no rash or lesion Psych- euthymic mood, full affect Neuro- strength and sensation are intact  Labs:   Lab Results  Component Value Date   WBC 6.7 04/06/2016   HGB 14.9 04/06/2016   HCT 45.8 04/06/2016   MCV 95.2 04/06/2016   PLT 201 04/06/2016     Recent Labs Lab 04/11/16 1130  NA 140  K 4.5  CL 108  CO2 27  BUN 13  CREATININE 1.06  CALCIUM 9.7  GLUCOSE 108*      Radiology/Studies: Dg Chest 2 View Result Date: 04/06/2016 CLINICAL DATA:  History of blood clots, patient has recently been placed on Coumadin, atrial fibrillation EXAM: CHEST  2 VIEW COMPARISON:  Chest x-ray of 03/20/2015 FINDINGS: There is subtle nodularity at  the lung bases and within the right mid upper lung field. Some these vague nodular opacities may be vessels overlapping, but small pulmonary nodules cannot be excluded. Either followup chest x-ray or preferably CT the chest is recommended to assess more sensitively. Bibasilar linear atelectasis or scarring is noted. The lungs remain somewhat hyperaerated with flattened hemidiaphragms suggesting an element of emphysema. Mediastinal and hilar contours are unremarkable. The heart is mildly enlarged and stable. The bones are osteopenic and there are mild compression deformities present in the mid and lower thoracic spine. IMPRESSION:  1. Subtle nodularity at the lung bases and in the right upper lung. Some of these opacities may be due to overlapping vasculature, but subtle pulmonary nodules cannot be excluded. Recommend unenhanced CT chest for further assessment. 2. Bibasilar linear atelectasis or scarring. 3. Probable emphysema. 4. Stable mild cardiomegaly. 5. Osteopenia and mild mid to lower thoracic compression deformities of uncertain age. Electronically Signed   By: Ivar Drape M.D.   On: 04/06/2016 15:47    [id number] fibrillation, rate 74, RBBB, QTC 479  Assessment/Plan; 1.  Persistent atrial fibrillation The patient has persistent atrial fibrillation and amiodarone was discontinued earlier this year with hyperthyroidism.  Amio level 01/28/16 0.3.  Plan for TEE today as recent repeat TEE showed LA thrombus.   BMET, Mg, EKG ok (RBBB) Will plan for TEE/DCCV today then start Tikosyn tonight With CrCl, will plan for 537mcg twice daily Continue Eliquis for CHADS2VASC of 3 LA 51 at echo 2010, our ability to maintain SR long term may be limited  2.  Hyperthyroidism Followed by Dr Dwyane Dee  3.  HTN Stable No change required today  4.  Lung nodules Seen on recent CXR Chest CT recommended per radiology.  Will discuss with patient and family further tomorrow   Signed, Chanetta Marshall, NP 04/11/2016 1:07  PM   EP attending  Patient seen and examined. Agree with the findings as noted above. Of note the patient did not undergo cardioversion today after his TEE demonstrated no left atrial appendage thrombus. He is admitted for initiation of dofetilide therapy. His exam is as noted above. He has an irregularly irregular rhythm and clear lungs with no edema. He is elderly appearing. He is a very large man. Telemetry demonstrates atrial fibrillation with a rapid ventricular response. EKG demonstrates atrial fibrillation with right bundle branch block. His QT interval is borderline, however when corrected for his QRS widening is within limits to initiate dofetilide therapy. Assessment 1. Persistent atrial fibrillation, unable take amiodarone 2. Admit for initiation of dofetilide 3. Hypertension Discussion - the patient will be admitted for initiation of dofetilide therapy. His QT interval be followed carefully. Electronically monitored.  Cristopher Peru, M.D.

## 2016-04-11 NOTE — Progress Notes (Signed)
  Echocardiogram Echocardiogram Transesophageal has been performed.  Antonio [name] 04/11/2016, 1:44 PM

## 2016-04-11 NOTE — Progress Notes (Signed)
ANTICOAGULATION CONSULT NOTE - Initial Consult  Pharmacy Consult for Warfarin Indication: atrial fibrillation  Allergies  Allergen Reactions  . Amiodarone Other (See Comments)    Thyroid toxicity    Patient Measurements:      Vital Signs: Temp: 97.7 F (36.5 C) (08/21 1204) Temp Source: Oral (08/21 1204) BP: 153/105 (08/21 1430) Pulse Rate: 46 (08/21 1430)  Labs:  Recent Labs  04/11/16 1023 04/11/16 1130  INR 4.1  --   CREATININE  --  1.06    Estimated Creatinine Clearance: 69.6 mL/min (by C-G formula based on SCr of 1.06 mg/dL).   Medical History: Past Medical History:  Diagnosis Date  . Appendicitis    SBP 7/09 ruputre w/ RLQ abscess 03/16/08  . Atrial fibrillation ([place])   . Basal cell carcinoma of nose 2016   Right side nasal tip, [name]  . BPH (benign prostatic hyperplasia) 06/29/2011  . Cardiomyopathy    secondary to Tachycardia. A-Acute on chronic admit for CHF 6/08. B- CHF coupled with atrial fib RVR electrophysiology study 09/27/07.  A- Multiple flutters and Atrial fib- no ablation performed b- ibutilide cardoversion c- amiordarone load- d/c in sinus rhyrthm d- coumadin anticoagulation- stopped at time of GI bleed 11/09  . Glaucoma   . Hyperlipidemia   . Hypertension   . Knee fracture, right 08/2014  . Mallory - Weiss tear    esophagitis, 6 unit hemorrhage- EPI injections   . Small bowel obstruction ([facility])     Medications:  Prescriptions Prior to Admission  Medication Sig Dispense Refill Last Dose  . diltiazem (CARDIZEM CD) 120 MG 24 hr capsule Take 1 capsule (120 mg total) by mouth daily. 90 capsule 3 04/11/2016 at 1130  . dorzolamide (TRUSOPT) 2 % ophthalmic solution Place 1 drop into both eyes 2 (two) times daily.     04/10/2016 at Unknown time  . metoprolol tartrate (LOPRESSOR) 25 MG tablet Take 2 tablets (50 mg total) by mouth 2 (two) times daily. 90 tablet 6 04/11/2016 at 1130  . Multiple Vitamins-Minerals (CENTRUM) tablet Take 1 tablet by mouth  daily.     04/10/2016 at Unknown time  . warfarin (COUMADIN) 5 MG tablet Take 1 tablet (5 mg total) by mouth as directed. 30 tablet 3 04/10/2016 at Unknown time   Scheduled:  . dofetilide  500 mcg Oral BID  . sodium chloride flush  3 mL Intravenous Q12H   Infusions:    Assessment: 82yo male with history of Afib on warfarin PTA presents for initiation of tikosyn.  PTA Warfarin Dose: 5mg  Fri and 7.5mg  AODs with last dose 04/10/16  Goal of Therapy:  INR 2-3 Monitor platelets by anticoagulation protocol: Yes   Plan:  Hold warfarin tonight x1 Daily INR Monitor s/sx of bleeding  Andrey Cota. Diona Foley, PharmD, [place] Clinical Pharmacist Pager [phone] 04/11/2016,2:54 PM

## 2016-04-11 NOTE — Progress Notes (Signed)
Patient ID: Antonio [name]                 DOB: 1929-02-08                    MRN: [id number]     HPI: Antonio [name] is a 82 y.o. male patient who presents to pharmacy clinic for [facility]. Hx of afib that has maintained SR on amiodarone x 7-8 years. Pt was recently hospitalized at [facility] was found to hyperthyroid with afib RVR. Amiodarone was d/ced. He was off anticoagulation due to remote GI bleed and pt's desires not to resume blood thinners. Plans were made for direct current cardioversion, however a TEE was performed which showed a left atrial thrombus. Due to visualization of the left atrial thrombus, his cardioversion was canceled. He was placed on anticoagulation therapy with Eliquis, 5 mg twice a day. He was treated with rate control agents, Cardizem 120 mg in addition to Metroprolol 12.5 mg twice a day. Amiodarone level previously checked 2 months ago and sufficient for Tikosyn start at 0.3.  Returnned 7/10, he was set up for Tikosyn, hospitalization preceded with TEE to make sure left thrombus was resolved. Unfortunately, It was not. Plans for Tikosyn were postponed and decision was made to start warfarin and stop DOAC. He has 4 weeks of therapeutic INRs and TEE is scheduled for today preceding Tikosyn initiation. Confirmed with Dr. Johnsie Cancel who is performing TEE that pt's INR of 4.1 today is ok.  Reviewed pt's medication list. Pt does not take any contraindicated or QTc prolonging medications. He is anticoagulated with warfarin and has had 4 weekly INR readings > 2. Confirmed with Dr. Johnsie Cancel who is performing TEE today that he is comfortable performing TEE with INR today of 4.1.Discussed importance of compliance with patient. He is aware he will have to report to the hospital if he misses more than 2 doses in a row. Pt's daughter will fill out Tikosyn patient assistance form and drop it off Thursday afternoon after discharge.  EKG reviewed by Dr. Rayann Heman. Afib  with RVR. Hand calculated QTc 433msec, vent rate of 115bpm. Labs: K 4.5, Mg 2.0, SCr 1.06, Wt 243 lb, CrCl 20mL/min   Past Medical History:  Diagnosis Date  . Appendicitis    SBP 7/09 ruputre w/ RLQ abscess 03/16/08  . Atrial fibrillation ([place])   . Basal cell carcinoma of nose 2016   Right side nasal tip, [name]  . BPH (benign prostatic hyperplasia) 06/29/2011  . Cardiomyopathy    secondary to Tachycardia. A-Acute on chronic admit for CHF 6/08. B- CHF coupled with atrial fib RVR electrophysiology study 09/27/07.  A- Multiple flutters and Atrial fib- no ablation performed b- ibutilide cardoversion c- amiordarone load- d/c in sinus rhyrthm d- coumadin anticoagulation- stopped at time of GI bleed 11/09  . Glaucoma   . Hyperlipidemia   . Hypertension   . Knee fracture, right 08/2014  . Mallory - Weiss tear    esophagitis, 6 unit hemorrhage- EPI injections   . Small bowel obstruction [facility])     Current Outpatient Prescriptions on File Prior to Visit  Medication Sig Dispense Refill  . diltiazem (CARDIZEM CD) 120 MG 24 hr capsule Take 1 capsule (120 mg total) by mouth daily. 90 capsule 3  . dorzolamide (TRUSOPT) 2 % ophthalmic solution Place 1 drop into both eyes 2 (two) times daily.      . metoprolol tartrate (LOPRESSOR) 25 MG tablet Take 2  tablets (50 mg total) by mouth 2 (two) times daily. 90 tablet 6  . Multiple Vitamins-Minerals (CENTRUM) tablet Take 1 tablet by mouth daily.      Marland Kitchen warfarin (COUMADIN) 5 MG tablet Take 1 tablet (5 mg total) by mouth as directed. 30 tablet 3   No current facility-administered medications on file prior to visit.     Allergies  Allergen Reactions  . Amiodarone Other (See Comments)    Thyroid toxicity    Assessment/Plan:   1. Atrial fibrillation - QTc, K and Mg acceptable for Tikosyn admit. INR elevated today at 4.1 but confirmed Dr. Johnsie Cancel will still proceed with TEE prior to Tikosyn start. CrCl 15m/min, anticipated starting dose of Tikosyn is  574mcg BID. Pt's daughter has Tikosyn patient assistance forms to fill out and will drop them off after hospital discharge.   Megan E. Supple, PharmD, [place] [id number] [address] [place], [place] [address] Phone: [phone]; Fax: [phone] 04/11/2016 11:38 AM

## 2016-04-11 NOTE — Progress Notes (Signed)
Patient's daughter spoke with me in private in regards to her father's Xray result. She was concerned about the Xray showing "emphysema and two possible lung nodules" per [name]. Amber NP was notified and she stated that she would look into this further and round on the patient in the morning.

## 2016-04-11 NOTE — H&P (View-Only) (Signed)
Patient ID: Antonio [name], male   DOB: [birth year], 82 y.o.   MRN: [id number]     Primary Care Physician: Antonio November, MD Referring Physician: [name], PA Electrophysiologist: Dr. Caryl [name] Cardiologist: None   Antonio [name] is a 82 y.o. male with a h/o afib that has maintained SR on amiodarone x 7-8 years. Prior to that had [place] which normalized with return of SR.  While visiting with his daughter several weeks ago in [place], [place], he had a syncopal episode. He was hospitalized at [facility] was found to hyperthyroid with afib RVR.Amiodarone was d/ced. He was off anticoagulation due to remote  GI bleed and pt's desires  not to resume blood thinners. Plans were made for direct current cardioversion, however a TEE was performed which showed a left atrial thrombus. Left ventricular ejection fraction was normal at 55-60%.Chadsvasc score is at least 3. There were no significant valve abnormalities. Due to visualization of the left atrial thrombus, his cardioversion was canceled. He was placed on anticoagulation therapy with Eliquis, 5 mg twice a day. He was treated with rate control agents, Cardizem 120 mg in addition to Metroprolol 12.5 mg twice a day. He was instructed to continue aspirin along with Eliquis.  He was seen by Antonio [name], [place] 5/8 and referred here for further addressing of restoring SR with AAD's and repeat cardioversion when  thought to be safe to resume when thyroid  normalized and thrombus dissolved. Pt did not sign up for Medicare D at 57 and currently has no drug coverage. Daughter is trying to get him signed up for drug coverage but it will not be before October. If Antonio [name] is used, cost may be a big concern, unless pt qualifies for pt assistance. QTc today is 523 ms,but RBBB is contributing.Qtc in SR around 440 ms. Last TSH drawn yesterday shows that thyroid is improving off amiodarone and on methimazole 10 mg daily. TSH 0.07 3 months ago and 6/7 0.31 and  methimazole reduced to 1 tab daily by Antonio [name].   Pt is symptomatic with Afib and is c/o fatigue and shortness of breath with activity. No PND, orthopnea, appears normvolemic. Off amiodarone around 6 weeks now. Rate control is reasonable and reports HR in 80's/low 90's at home. On BB/CCB.  Returns 7/10, he was set up for Tikosyn, hospitalization preceded with TEE to make sure left thrombus was resolved. Unfortunately, It was not. Plans for tikosyn were postponed and decision was made to start coumadin, stop DOAC. He has been on coumadin for 3 weeks, but has only had one INR that was therapeutic and last one was 1.3. He will have to have 3-4 weeks of therapeutic INR's before repeat TEE can be entertained. He has had lower extremity swelling which he thinks has come about since starting diltiazem. Since his thyroid is normalizing off amiodarone, his HR is better controlled in the 60's and may be able to do without cardizem now. Tolerating afib better at slower v rate.  He returns afib clinic 8/16 and has had 3 successive weekly therapeutic INR's and is pending the 4th INR on Monday. If therapeutic, plans are for TEE and admission for tikosyn that same day. He is now off methimazole, stopped when he saw Antonio [name] 8/14. Now TSH is chronically high. Pt is c/o of fatigue and shortness of breath. He has RVR today at 122 bpm. LLE did not improve with stopping Cardizem in July. Weight is up gradually 6 lbs since early July ,  I think this may be weight gain and not fluid since when he developed hyperthyroidism, his weight dropped to 228 from 260. He is not admitting to PND/orthopnea.   Today, he denies symptoms of  chest pain,  orthopnea, PND, lower extremity edema, dizziness, presyncope, syncope, or neurologic sequela. Positive for shortness of breath, palpitations, fatigue The patient is tolerating medications without difficulties and is otherwise without complaint today.   Past Medical History:  Diagnosis Date    . Appendicitis    SBP 7/09 ruputre w/ RLQ abscess 03/16/08  . Atrial fibrillation ([place])   . Basal cell carcinoma of nose 2016   Right side nasal tip, [name]  . BPH (benign prostatic hyperplasia) 06/29/2011  . Cardiomyopathy    secondary to Tachycardia. A-Acute on chronic admit for CHF 6/08. B- CHF coupled with atrial fib RVR electrophysiology study 09/27/07.  A- Multiple flutters and Atrial fib- no ablation performed b- ibutilide cardoversion c- amiordarone load- d/c in sinus rhyrthm d- coumadin anticoagulation- stopped at time of GI bleed 11/09  . Glaucoma   . Hyperlipidemia   . Hypertension   . Knee fracture, right 08/2014  . Mallory - Weiss tear    esophagitis, 6 unit hemorrhage- EPI injections   . Small bowel obstruction [facility])    Past Surgical History:  Procedure Laterality Date  . APPENDECTOMY     exploratory, ruptured appendix, RLQ abscess  . BCC nose surgery    . COLONOSCOPY    . EP study  09/27/07   see pmh  . HEMORRHOID SURGERY    . POLYPECTOMY    . TEE WITHOUT CARDIOVERSION N/A 02/08/2016   Procedure: TRANSESOPHAGEAL ECHOCARDIOGRAM (TEE);  Surgeon: Fay Records, MD;  Location: [facility] ENDOSCOPY;  Service: Cardiovascular;  Laterality: N/A;  . TEE/DCCV  02/07/07    Current Outpatient Prescriptions  Medication Sig Dispense Refill  . dorzolamide (TRUSOPT) 2 % ophthalmic solution Place 1 drop into both eyes 2 (two) times daily.      . metoprolol tartrate (LOPRESSOR) 25 MG tablet Take 2 tablets (50 mg total) by mouth 2 (two) times daily. 90 tablet 6  . Multiple Vitamins-Minerals (CENTRUM) tablet Take 1 tablet by mouth daily.      Marland Kitchen warfarin (COUMADIN) 5 MG tablet Take 1 tablet (5 mg total) by mouth as directed. 30 tablet 3  . diltiazem (CARDIZEM CD) 120 MG 24 hr capsule Take 1 capsule (120 mg total) by mouth daily. 90 capsule 3   No current facility-administered medications for this encounter.     Allergies  Allergen Reactions  . Amiodarone Other (See Comments)    Thyroid  toxicity    Social History   Social History  . Marital status: Married    Spouse name: N/A  . Number of children: 3  . Years of education: N/A   Occupational History  . Cabinetmaker     works part time   Social History Main Topics  . Smoking status: Former Smoker    Quit date: 06/30/1971  . Smokeless tobacco: Never Used     Comment: quit tobacco remotely , ~ 1 ppd  . Alcohol use No  . Drug use: No  . Sexual activity: Not on file   Other Topics Concern  . Not on file   Social History Narrative   Household-- pt and wife (wife developing dementia)   One son lives in [place], goes there from time to time  Family History  Problem Relation Age of Onset  . Uterine cancer Mother   . Colon cancer Neg Hx   . Prostate cancer Neg Hx   . Diabetes Neg Hx   . Heart attack Maternal Uncle   . Heart disease Maternal Uncle     ROS- All systems are reviewed and negative except as per the HPI above  Physical Exam: Vitals:   04/06/16 1427  BP: (!) 162/108  Pulse: (!) 122  Weight: 250 lb 12.8 oz (113.8 kg)  Height: 6\' 3"  (1.905 m)    GEN- The patient is well appearing, alert and oriented x 3 today.   Head- normocephalic, atraumatic Eyes-  Sclera clear, conjunctiva pink Ears- hearing intact Oropharynx- clear Neck- supple, no JVP Lymph- no cervical lymphadenopathy Lungs- Clear to ausculation bilaterally, normal work of breathing Heart- Irregular rate and rhythm, no murmurs, rubs or gallops, PMI not laterally displaced GI- soft, NT, ND, + BS Extremities- no clubbing, cyanosis,  2+ bilateral edema MS- no significant deformity or atrophy Skin- no rash or lesion Psych- euthymic mood, full affect Neuro- strength and sensation are intact  EKG-Afib with v rate 64 bpm,  RBBB, qrs int 150 ms, Qtc 447 ms INR's 7/31- 2.6, 8/7-5.0, 8/14-3.2, 8/21- pending per coumadin clinic  Assessment and Plan: 1.Persistent symptomatic  afib secondary to thyroid toxicity with  amiodarone use Remains in afib, v rate is elevated today, pt feels more dyspneic,  will restart cardizem 120 mg daily for better rate control Continue with metoprolol  CXR today, if shows edema, will start lasix for a few days prior to TEE  2. Hyperthyroidism Off methimazole since Monday Per Endocrinologist  3.Persisitent left atrial thrombus Off Doac, on coumadin Continue coumadin checks with coumadin clinic Pt will report for 4th weekly INR on Monday and if therapeutic, plans are for repeat TEE Monday at 1pm, and if no thrombus, admit for tikosyn load Bmet/mag today   Addendum- 4pm- 8/16- CXR did not show any edema, but showed emphysema, subtle nodularity in lung bases and mid upper lung, suggested f/u unenhanced CT. Hopefully this can be arranged if/when hospitalized for tikosyn.  labs- creat 1.07,  K+ 4.3 and mag at 2.0.   Geroge Baseman Yaiden Yang, [facility] [address] [place], [place] [address] [phone]

## 2016-04-11 NOTE — Progress Notes (Signed)
Pharmacy Review for Dofetilide (Tikosyn) Initiation  Admit Complaint: 82 y.o. male admitted 04/11/2016 with atrial fibrillation to be initiated on dofetilide.   Assessment:  Patient Exclusion Criteria: If any screening criteria checked as "Yes", then  patient  should NOT receive dofetilide until criteria item is corrected. If "Yes" please indicate correction plan.  YES  NO Patient  Exclusion Criteria Correction Plan  []  [x]  Baseline QTc interval is greater than or equal to 440 msec. IF above YES box checked dofetilide contraindicated unless patient has ICD; then may proceed if QTc 500-550 msec or with known ventricular conduction abnormalities may proceed with QTc 550-600 msec. QTc =   436   []  [x]  Magnesium level is less than 1.8 mEq/l : Last magnesium:  Lab Results  Component Value Date   MG 2.0 04/11/2016         []  [x]  Potassium level is less than 4 mEq/l : Last potassium:  Lab Results  Component Value Date   K 4.5 04/11/2016         []  [x]  Patient is known or suspected to have a digoxin level greater than 2 ng/ml: No results found for: DIGOXIN    []  [x]  Creatinine clearance less than 20 ml/min (calculated using Cockcroft-Gault, actual body weight and serum creatinine): Estimated Creatinine Clearance: 69.6 mL/min (by C-G formula based on SCr of 1.06 mg/dL).    []  [x]  Patient has received drugs known to prolong the QT intervals within the last 48 hours (phenothiazines, tricyclics or tetracyclic antidepressants, erythromycin, H-1 antihistamines, cisapride, fluoroquinolones, azithromycin). Drugs not listed above may have an, as yet, undetected potential to prolong the QT interval, updated information on QT prolonging agents is available at this website:QT prolonging agents   []  [x]  Patient received a dose of hydrochlorothiazide (Oretic) alone or in any combination including triamterene (Dyazide, Maxzide) in the last 48 hours.   []  [x]  Patient received a medication known to increase  dofetilide plasma concentrations prior to initial dofetilide dose:  . Trimethoprim (Primsol, Proloprim) in the last 36 hours . Verapamil (Calan, Verelan) in the last 36 hours or a sustained release dose in the last 72 hours . Megestrol (Megace) in the last 5 days  . Cimetidine (Tagamet) in the last 6 hours . Ketoconazole (Nizoral) in the last 24 hours . Itraconazole (Sporanox) in the last 48 hours  . Prochlorperazine (Compazine) in the last 36 hours    []  [x]  Patient is known to have a history of torsades de pointes; congenital or acquired long QT syndromes.   []  [x]  Patient has received a Class 1 antiarrhythmic with less than 2 half-lives since last dose. (Disopyramide, Quinidine, Procainamide, Lidocaine, Mexiletine, Flecainide, Propafenone)   []  [x]  Patient has received amiodarone therapy in the past 3 months or amiodarone level is greater than 0.3 ng/ml.    Patient has been appropriately anticoagulated with Warfarin.  Ordering provider was confirmed at [email or web address] if they are not listed on the [place] Prescribers list.  Goal of Therapy: Follow renal function, electrolytes, potential drug interactions, and dose adjustment. Provide education and 1 week supply at discharge.  Plan:  [x]   Physician selected initial dose within range recommended for patients level of renal function - will monitor for response.  []   Physician selected initial dose outside of range recommended for patients level of renal function - will discuss if the dose should be altered at this time.   Select One Calculated CrCl  Dose q12h  [x]  > 60 ml/min 500 mcg  []   40-60 ml/min 250 mcg  []  20-40 ml/min 125 mcg   2. Follow up QTc after the first 5 doses, renal function, electrolytes (K & Mg) daily x 3     days, dose adjustment, success of initiation and facilitate 1 week discharge supply as     clinically indicated.  3. Initiate Tikosyn education video (Call [phone] and ask for video # 116).  4.  Place Enrollment Form on the chart for discharge supply of dofetilide.  Andrey Cota. Diona Foley, PharmD, BCPS Clinical Pharmacist 2:57 PM 04/11/2016

## 2016-04-12 ENCOUNTER — Encounter (HOSPITAL_COMMUNITY): Payer: Self-pay | Admitting: Cardiovascular Disease

## 2016-04-12 DIAGNOSIS — E876 Hypokalemia: Secondary | ICD-10-CM

## 2016-04-12 DIAGNOSIS — I10 Essential (primary) hypertension: Secondary | ICD-10-CM

## 2016-04-12 LAB — BASIC METABOLIC PANEL
Anion gap: 7 (ref 5–15)
BUN: 13 mg/dL (ref 6–20)
CALCIUM: 9.4 mg/dL (ref 8.9–10.3)
CO2: 25 mmol/L (ref 22–32)
CREATININE: 1.02 mg/dL (ref 0.61–1.24)
Chloride: 107 mmol/L (ref 101–111)
Glucose, Bld: 89 mg/dL (ref 65–99)
Potassium: 3.9 mmol/L (ref 3.5–5.1)
SODIUM: 139 mmol/L (ref 135–145)

## 2016-04-12 LAB — PROTIME-INR
INR: 3.32
PROTHROMBIN TIME: 34.4 s — AB (ref 11.4–15.2)

## 2016-04-12 LAB — MAGNESIUM: MAGNESIUM: 1.9 mg/dL (ref 1.7–2.4)

## 2016-04-12 MED ORDER — FUROSEMIDE 40 MG PO TABS
40.0000 mg | ORAL_TABLET | Freq: Every day | ORAL | Status: AC
  Administered 2016-04-12: 40 mg via ORAL
  Filled 2016-04-12: qty 1

## 2016-04-12 MED ORDER — POTASSIUM CHLORIDE CRYS ER 20 MEQ PO TBCR
40.0000 meq | EXTENDED_RELEASE_TABLET | Freq: Once | ORAL | Status: AC
  Administered 2016-04-12: 40 meq via ORAL
  Filled 2016-04-12: qty 2

## 2016-04-12 MED ORDER — WARFARIN SODIUM 5 MG PO TABS
5.0000 mg | ORAL_TABLET | Freq: Once | ORAL | Status: AC
Start: 2016-04-12 — End: 2016-04-12
  Administered 2016-04-12: 5 mg via ORAL
  Filled 2016-04-12: qty 1

## 2016-04-12 MED ORDER — LISINOPRIL 5 MG PO TABS
5.0000 mg | ORAL_TABLET | Freq: Every day | ORAL | Status: DC
  Administered 2016-04-12 – 2016-04-14 (×3): 5 mg via ORAL
  Filled 2016-04-12 (×3): qty 1

## 2016-04-12 MED FILL — Perflutren Lipid Microsphere IV Susp 1.1 MG/ML: INTRAVENOUS | Qty: 10 | Status: AC

## 2016-04-12 NOTE — Progress Notes (Signed)
Patient sleeping and converted to S.R. 56 1 degree heart block N.T. To do EKG to confirm.R.N. aware

## 2016-04-12 NOTE — Discharge Instructions (Addendum)
Moderate Conscious Sedation, Adult, Care After °Refer to this sheet in the next few weeks. These instructions provide you with information on caring for yourself after your procedure. Your health care provider may also give you more specific instructions. Your treatment has been planned according to current medical practices, but problems sometimes occur. Call your health care provider if you have any problems or questions after your procedure. °WHAT TO EXPECT AFTER THE PROCEDURE  °After your procedure: °· You may feel sleepy, clumsy, and have poor balance for several hours. °· Vomiting may occur if you eat too soon after the procedure. °HOME CARE INSTRUCTIONS °· Do not participate in any activities where you could become injured for at least 24 hours. Do not: °¨ Drive. °¨ Swim. °¨ Ride a bicycle. °¨ Operate heavy machinery. °¨ Cook. °¨ Use power tools. °¨ Climb ladders. °¨ Work from a high place. °· Do not make important decisions or sign legal documents until you are improved. °· If you vomit, drink water, juice, or soup when you can drink without vomiting. Make sure you have little or no nausea before eating solid foods. °· Only take over-the-counter or prescription medicines for pain, discomfort, or fever as directed by your health care provider. °· Make sure you and your family fully understand everything about the medicines given to you, including what side effects may occur. °· You should not drink alcohol, take sleeping pills, or take medicines that cause drowsiness for at least 24 hours. °· If you smoke, do not smoke without supervision. °· If you are feeling better, you may resume normal activities 24 hours after you were sedated. °· Keep all appointments with your health care provider. °SEEK MEDICAL CARE IF: °· Your skin is pale or bluish in color. °· You continue to feel nauseous or vomit. °· Your pain is getting worse and is not helped by medicine. °· You have bleeding or swelling. °· You are still  sleepy or feeling clumsy after 24 hours. °SEEK IMMEDIATE MEDICAL CARE IF: °· You develop a rash. °· You have difficulty breathing. °· You develop any type of allergic problem. °· You have a fever. °MAKE SURE YOU: °· Understand these instructions. °· Will watch your condition. °· Will get help right away if you are not doing well or get worse. °  °This information is not intended to replace advice given to you by your health care provider. Make sure you discuss any questions you have with your health care provider. °  °Document Released: 05/29/2013 Document Revised: 08/29/2014 Document Reviewed: 05/29/2013 °Elsevier Interactive Patient Education ©2016 Elsevier Inc. °Transesophageal Echocardiogram °Transesophageal echocardiography (TEE) is a picture test of your heart using sound waves. The pictures taken can give very detailed pictures of your heart. This can help your doctor see if there are problems with your heart. TEE can check: °· If your heart has blood clots in it. °· How well your heart valves are working. °· If you have an infection on the inside of your heart. °· Some of the major arteries of your heart. °· If your heart valve is working after a repair. °· Your heart before a procedure that uses a shock to your heart to get the rhythm back to normal. °BEFORE THE PROCEDURE °· Do not eat or drink for 6 hours before the procedure or as told by your doctor. °· Make plans to have someone drive you home after the procedure. Do not drive yourself home. °· An IV tube will be put in   your arm. PROCEDURE  You will be given a medicine to help you relax (sedative). It will be given through the IV tube.  A numbing medicine will be sprayed or gargled in the back of your throat to help numb it.  The tip of the probe is placed into the back of your mouth. You will be asked to swallow. This helps to pass the probe into your esophagus.  Once the tip of the probe is in the right place, your doctor can take pictures of  your heart.  You may feel pressure at the back of your throat. AFTER THE PROCEDURE  You will be taken to a recovery area so the sedative can wear off.  Your throat may be sore and scratchy. This will go away slowly over time.  You will go home when you are fully awake and able to swallow liquids.  You should have someone stay with you for the next 24 hours.  Do not drive or operate machinery for the next 24 hours.   This information is not intended to replace advice given to you by your health care provider. Make sure you discuss any questions you have with your health care provider.   Document Released: 06/05/2009 Document Revised: 08/13/2013 Document Reviewed: 02/07/2013 Elsevier Interactive Patient Education 2016 [place] on my medicine - Coumadin   (Warfarin)  This medication education was reviewed with me or my healthcare representative as part of my discharge preparation.    Why was Coumadin prescribed for you? Coumadin was prescribed for you because you have a blood clot or a medical condition that can cause an increased risk of forming blood clots. Blood clots can cause serious health problems by blocking the flow of blood to the heart, lung, or brain. Coumadin can prevent harmful blood clots from forming. As a reminder your indication for Coumadin is:   Stroke Prevention Because Of Atrial Fibrillation  What test will check on my response to Coumadin? While on Coumadin (warfarin) you will need to have an INR test regularly to ensure that your dose is keeping you in the desired range. The INR (international normalized ratio) number is calculated from the result of the laboratory test called prothrombin time (PT).  If an INR APPOINTMENT HAS NOT ALREADY BEEN MADE FOR YOU please schedule an appointment to have this lab work done by your health care provider within 7 days. Your INR goal is usually a number between:  2 to 3 or your provider may give you a more  narrow range like 2-2.5.  Ask your health care provider during an office visit what your goal INR is.  What  do you need to  know  About  COUMADIN? Take Coumadin (warfarin) exactly as prescribed by your healthcare provider about the same time each day.  DO NOT stop taking without talking to the doctor who prescribed the medication.  Stopping without other blood clot prevention medication to take the place of Coumadin may increase your risk of developing a new clot or stroke.  Get refills before you run out.  What do you do if you miss a dose? If you miss a dose, take it as soon as you remember on the same day then continue your regularly scheduled regimen the next day.  Do not take two doses of Coumadin at the same time.  Important Safety Information A possible side effect of Coumadin (Warfarin) is an increased risk of bleeding. You should call your healthcare provider right  away if you experience any of the following: ? Bleeding from an injury or your nose that does not stop. ? Unusual colored urine (red or dark brown) or unusual colored stools (red or black). ? Unusual bruising for unknown reasons. ? A serious fall or if you hit your head (even if there is no bleeding).  Some foods or medicines interact with Coumadin (warfarin) and might alter your response to warfarin. To help avoid this: ? Eat a balanced diet, maintaining a consistent amount of Vitamin K. ? Notify your provider about major diet changes you plan to make. ? Avoid alcohol or limit your intake to 1 drink for women and 2 drinks for men per day. (1 drink is 5 oz. wine, 12 oz. beer, or 1.5 oz. liquor.)  Make sure that ANY health care provider who prescribes medication for you knows that you are taking Coumadin (warfarin).  Also make sure the healthcare provider who is monitoring your Coumadin knows when you have started a new medication including herbals and non-prescription products.  Coumadin (Warfarin)  Major Drug  Interactions  Increased Warfarin Effect Decreased Warfarin Effect  Alcohol (large quantities) Antibiotics (esp. Septra/Bactrim, Flagyl, Cipro) Amiodarone (Cordarone) Aspirin (ASA) Cimetidine (Tagamet) Megestrol (Megace) NSAIDs (ibuprofen, naproxen, etc.) Piroxicam (Feldene) Propafenone (Rythmol SR) Propranolol (Inderal) Isoniazid (INH) Posaconazole (Noxafil) Barbiturates (Phenobarbital) Carbamazepine (Tegretol) Chlordiazepoxide (Librium) Cholestyramine (Questran) Griseofulvin Oral Contraceptives Rifampin Sucralfate (Carafate) Vitamin K   Coumadin (Warfarin) Major Herbal Interactions  Increased Warfarin Effect Decreased Warfarin Effect  Garlic Ginseng Ginkgo biloba Coenzyme Q10 Green tea St. Johns wort    Coumadin (Warfarin) FOOD Interactions  Eat a consistent number of servings per week of foods HIGH in Vitamin K (1 serving =  cup)  Collards (cooked, or boiled & drained) Kale (cooked, or boiled & drained) Mustard greens (cooked, or boiled & drained) Parsley *serving size only =  cup Spinach (cooked, or boiled & drained) Swiss chard (cooked, or boiled & drained) Turnip greens (cooked, or boiled & drained)  Eat a consistent number of servings per week of foods MEDIUM-HIGH in Vitamin K (1 serving = 1 cup)  Asparagus (cooked, or boiled & drained) Broccoli (cooked, boiled & drained, or raw & chopped) Brussel sprouts (cooked, or boiled & drained) *serving size only =  cup Lettuce, raw (green leaf, endive, romaine) Spinach, raw Turnip greens, raw & chopped   These websites have more information on Coumadin (warfarin):  FailFactory.se; VeganReport.com.au;

## 2016-04-12 NOTE — Care Management Note (Signed)
Case Management Note Marvetta Gibbons RN, BSN Unit 2W-Case Manager [phone]  Patient Details  Name: Antonio [name] MRN: [id number] Date of Birth: 1929-07-17  Subjective/Objective:     Pt admitted with afib for Tikosyn load               Action/Plan: PTA pt lived at home with spouse- referral received for Tikosyn assistance- per insurance check pt does not have part D-spoke with pt at bedside- and confirmed that pt has no medication coverage- does not have part D- pt reports that he has assistance for Eliquis- he states that the MD office gave him and his daughter paperwork to fill out for Tikosyn - pt gave this CM permission to call his daughter [name]- call made to [place]- who confirmed that she has the paperwork for pt assistance with Tikosyn- she is working on filling out and will bring it in to have MD/PA sign here- (will place it on shadow chart for signature)- once signed she was told to bring it back to office for them to fax in to pt assistance. Pt will need 7 day supply on discharge from [facility]- have explained this to daughter- have also explained to daughter about outside pharmacy needing to order drug in stock once they have script.   Expected Discharge Date:     04/14/16             Expected Discharge Plan:  Home/Self Care  In-House Referral:     Discharge planning Services  CM Consult, Medication Assistance  Post Acute Care Choice:    Choice offered to:     DME Arranged:    DME Agency:     HH Arranged:    HH Agency:     Status of Service:  In process, will continue to follow  If discussed at Long Length of Stay Meetings, dates discussed:    Additional Comments:  Dawayne Patricia, RN 04/12/2016, 12:00 PM

## 2016-04-12 NOTE — Progress Notes (Addendum)
Patient Name: Antonio [name]      SUBJECTIVE: feeling better  Converted overnight  Past Medical History:  Diagnosis Date  . Appendicitis    SBP 7/09 ruputre w/ RLQ abscess 03/16/08  . Atrial fibrillation ([place])   . Basal cell carcinoma of nose 2016   Right side nasal tip, [name]  . BPH (benign prostatic hyperplasia) 06/29/2011  . Cardiomyopathy    secondary to Tachycardia. A-Acute on chronic admit for CHF 6/08. B- CHF coupled with atrial fib RVR electrophysiology study 09/27/07.  A- Multiple flutters and Atrial fib- no ablation performed b- ibutilide cardoversion c- amiordarone load- d/c in sinus rhyrthm d- coumadin anticoagulation- stopped at time of GI bleed 11/09  . Glaucoma   . Hyperlipidemia   . Hypertension   . Knee fracture, right 08/2014  . Mallory - Weiss tear    esophagitis, 6 unit hemorrhage- EPI injections   . Small bowel obstruction (HCC)     Scheduled Meds:  Scheduled Meds: . diltiazem  120 mg Oral Daily  . dofetilide  500 mcg Oral BID  . dorzolamide  1 drop Both Eyes BID  . furosemide  40 mg Oral Daily  . lisinopril  5 mg Oral Daily  . metoprolol tartrate  50 mg Oral BID  . potassium chloride  40 mEq Oral Once  . sodium chloride flush  3 mL Intravenous Q12H  . Warfarin - Pharmacist Dosing Inpatient   Does not apply q1800   Continuous Infusions:  sodium chloride flush    PHYSICAL EXAM Vitals:   04/11/16 1440 04/11/16 1502 04/11/16 2206 04/12/16 0404  BP: (!) 153/101 (!) 139/91 132/85 (!) 141/84  Pulse: 84 77 68 61  Resp: (!) 25 18 (!) 21 20  Temp:  97.5 F (36.4 C) 97.7 F (36.5 C) 97.5 F (36.4 C)  TempSrc:   Oral Oral  SpO2: 99% 92% 96% 98%  Weight:    237 lb 11.2 oz (107.8 kg)  Height:       Well developed and nourished in no acute distress HENT normal Neck supple with JVP-flat Carotids brisk and full without bruits wheexing Regular rate and rhythm, no murmurs or gallops Abd-soft with active BS without hepatomegaly No  Clubbing cyanosis 1+ edema Skin-warm and dry A & Oriented  Grossly normal sensory and motor function  TELEMETRY: Reviewed telemetry pt in afib>>sinus:   No intake or output data in the 24 hours ending 04/12/16 0749  LABS: Basic Metabolic Panel:  Recent Labs Lab 04/06/16 1414 04/11/16 1130 04/12/16 0322  NA 140 140 139  K 4.3 4.5 3.9  CL 106 108 107  CO2 25 27 25   GLUCOSE 117* 108* 89  BUN 15 13 13   CREATININE 1.07 1.06 1.02  CALCIUM 10.0 9.7 9.4  MG 2.0 2.0 1.9   Cardiac Enzymes: No results for input(s): CKTOTAL, CKMB, CKMBINDEX, TROPONINI in the last 72 hours. CBC:  Recent Labs Lab 04/06/16 1414  WBC 6.7  HGB 14.9  HCT 45.8  MCV 95.2  PLT 201   PROTIME:  Recent Labs  04/11/16 1023 04/12/16 0322  LABPROT  --  34.4*  INR 4.1 3.32   Liver Function Tests: No results for input(s): AST, ALT, ALKPHOS, BILITOT, PROT, ALBUMIN in the last 72 hours. No results for input(s): LIPASE, AMYLASE in the last 72 hours. BNP: BNP (last 3 results) No results for input(s): BNP in the last 8760 hours.  ProBNP (last 3 results) No results for input(s): PROBNP  in the last 8760 hours.    ASSESSMENT AND PLAN:  Active Problems:   Persistent atrial fibrillation ([place])    Has converted   Replete K Would transition back to [place] in about 4 weeks Add lisinopril for BP Will owrk to get off of dilt as assoc with edema Will give one dose diuretics for edema and wheezing ? chf  Signed, Virl Axe MD  04/12/2016

## 2016-04-12 NOTE — Progress Notes (Signed)
[facility] for Warfarin Indication: atrial fibrillation  Allergies  Allergen Reactions  . Amiodarone Other (See Comments)    Thyroid toxicity    Patient Measurements: Height: 6\' 3"  (190.5 cm) Weight: 237 lb 11.2 oz (107.8 kg) IBW/kg (Calculated) : 84.5    Vital Signs: Temp: 97.5 F (36.4 C) (08/22 0404) Temp Source: Oral (08/22 0404) BP: 141/84 (08/22 0404) Pulse Rate: 61 (08/22 0404)  Labs:  Recent Labs  04/11/16 1023 04/11/16 1130 04/12/16 0322  LABPROT  --   --  34.4*  INR 4.1  --  3.32  CREATININE  --  1.06 1.02    Estimated Creatinine Clearance: 71.5 mL/min (by C-G formula based on SCr of 1.02 mg/dL).   Medical History: Past Medical History:  Diagnosis Date  . Appendicitis    SBP 7/09 ruputre w/ RLQ abscess 03/16/08  . Atrial fibrillation ([place])   . Basal cell carcinoma of nose 2016   Right side nasal tip, [name]  . BPH (benign prostatic hyperplasia) 06/29/2011  . Cardiomyopathy    secondary to Tachycardia. A-Acute on chronic admit for CHF 6/08. B- CHF coupled with atrial fib RVR electrophysiology study 09/27/07.  A- Multiple flutters and Atrial fib- no ablation performed b- ibutilide cardoversion c- amiordarone load- d/c in sinus rhyrthm d- coumadin anticoagulation- stopped at time of GI bleed 11/09  . Glaucoma   . Hyperlipidemia   . Hypertension   . Knee fracture, right 08/2014  . Mallory - Weiss tear    esophagitis, 6 unit hemorrhage- EPI injections   . Small bowel obstruction (HCC)     Medications:  Prescriptions Prior to Admission  Medication Sig Dispense Refill Last Dose  . diltiazem (CARDIZEM CD) 120 MG 24 hr capsule Take 1 capsule (120 mg total) by mouth daily. 90 capsule 3 04/11/2016 at unknown  . dorzolamide (TRUSOPT) 2 % ophthalmic solution Place 1 drop into both eyes 2 (two) times daily.     04/10/2016 at Unknown time  . metoprolol tartrate (LOPRESSOR) 25 MG tablet Take 2 tablets (50 mg total) by mouth 2  (two) times daily. 90 tablet 6 04/11/2016 at 1130  . Multiple Vitamins-Minerals (CENTRUM) tablet Take 1 tablet by mouth daily.     04/10/2016 at Unknown time  . warfarin (COUMADIN) 7.5 MG tablet Take 5-7.5 mg by mouth See admin instructions. Take 7.5mg  tablet every day except on Fridays take 5mg  per patient and family members   04/10/2016 at 1800  . warfarin (COUMADIN) 5 MG tablet Take 1 tablet (5 mg total) by mouth as directed. (Patient not taking: Reported on 04/11/2016) 30 tablet 3 Not Taking at Unknown time   Scheduled:  . diltiazem  120 mg Oral Daily  . dofetilide  500 mcg Oral BID  . dorzolamide  1 drop Both Eyes BID  . furosemide  40 mg Oral Daily  . lisinopril  5 mg Oral Daily  . metoprolol tartrate  50 mg Oral BID  . potassium chloride  40 mEq Oral Once  . sodium chloride flush  3 mL Intravenous Q12H  . Warfarin - Pharmacist Dosing Inpatient   Does not apply q1800   Infusions:    Assessment: 82yo male with history of Afib on warfarin PTA presents for initiation of tikosyn. INR has trended back down towards therapeutic after holding a dose. Will give a smaller dose tonight so that we are dropping too much. Pt converted to NSR overnight after initiation of tikosyn. Note from [name] mentions transitioning  to apixaban in 4 weeks.  PTA Warfarin Dose: 5mg  Fri and 7.5mg  AODs with last dose 04/10/16  Goal of Therapy:  INR 2-3 Monitor platelets by anticoagulation protocol: Yes   Plan:  Warfarin 5mg  tonight x1 Daily INR Monitor s/sx of bleeding  Andrey Cota. Diona Foley, PharmD, BCPS Clinical Pharmacist Pager [phone] 04/12/2016,8:02 AM

## 2016-04-13 ENCOUNTER — Encounter (HOSPITAL_COMMUNITY): Payer: Self-pay | Admitting: General Practice

## 2016-04-13 ENCOUNTER — Inpatient Hospital Stay (HOSPITAL_COMMUNITY): Payer: Medicare Other

## 2016-04-13 LAB — BASIC METABOLIC PANEL
ANION GAP: 6 (ref 5–15)
BUN: 12 mg/dL (ref 6–20)
CHLORIDE: 106 mmol/L (ref 101–111)
CO2: 27 mmol/L (ref 22–32)
CREATININE: 0.89 mg/dL (ref 0.61–1.24)
Calcium: 9.8 mg/dL (ref 8.9–10.3)
GFR calc non Af Amer: >60 mL/min (ref >60)
Glucose, Bld: 103 mg/dL — ABNORMAL HIGH (ref 65–99)
Potassium: 3.9 mmol/L (ref 3.5–5.1)
Sodium: 139 mmol/L (ref 135–145)

## 2016-04-13 LAB — PROTIME-INR
INR: 2.57
Prothrombin Time: 28.1 seconds — ABNORMAL HIGH (ref 11.4–15.2)

## 2016-04-13 LAB — MAGNESIUM: Magnesium: 2.4 mg/dL (ref 1.7–2.4)

## 2016-04-13 MED ORDER — WARFARIN SODIUM 7.5 MG PO TABS
7.5000 mg | ORAL_TABLET | Freq: Once | ORAL | Status: AC
Start: 2016-04-13 — End: 2016-04-13
  Administered 2016-04-13: 7.5 mg via ORAL
  Filled 2016-04-13: qty 1

## 2016-04-13 MED ORDER — DOFETILIDE 250 MCG PO CAPS
375.0000 ug | ORAL_CAPSULE | Freq: Two times a day (BID) | ORAL | Status: DC
  Administered 2016-04-13 – 2016-04-14 (×3): 375 ug via ORAL
  Filled 2016-04-13 (×3): qty 1

## 2016-04-13 MED ORDER — POTASSIUM CHLORIDE CRYS ER 20 MEQ PO TBCR
20.0000 meq | EXTENDED_RELEASE_TABLET | Freq: Once | ORAL | Status: AC
  Administered 2016-04-13: 20 meq via ORAL
  Filled 2016-04-13: qty 1

## 2016-04-13 NOTE — Progress Notes (Signed)
[facility] for Warfarin Indication: atrial fibrillation  Allergies  Allergen Reactions  . Amiodarone Other (See Comments)    Thyroid toxicity    Patient Measurements: Height: 6\' 3"  (190.5 cm) Weight: 230 lb 12.8 oz (104.7 kg) IBW/kg (Calculated) : 84.5    Vital Signs: Temp: 97.7 F (36.5 C) (08/23 0540) Temp Source: Oral (08/23 0540) BP: 137/83 (08/23 0540) Pulse Rate: 60 (08/23 0540)  Labs:  Recent Labs  04/11/16 1023 04/11/16 1130 04/12/16 0322 04/13/16 0252  LABPROT  --   --  34.4* 28.1*  INR 4.1  --  3.32 2.57  CREATININE  --  1.06 1.02 0.89    Estimated Creatinine Clearance: 80.9 mL/min (by C-G formula based on SCr of 0.89 mg/dL).   Medical History: Past Medical History:  Diagnosis Date  . Appendicitis    SBP 7/09 ruputre w/ RLQ abscess 03/16/08  . Atrial fibrillation ([place])   . Basal cell carcinoma of nose 2016   Right side nasal tip, [name]  . BPH (benign prostatic hyperplasia) 06/29/2011  . Cardiomyopathy    secondary to Tachycardia. A-Acute on chronic admit for CHF 6/08. B- CHF coupled with atrial fib RVR electrophysiology study 09/27/07.  A- Multiple flutters and Atrial fib- no ablation performed b- ibutilide cardoversion c- amiordarone load- d/c in sinus rhyrthm d- coumadin anticoagulation- stopped at time of GI bleed 11/09  . Glaucoma   . Hyperlipidemia   . Hypertension   . Knee fracture, right 08/2014  . Mallory - Weiss tear    esophagitis, 6 unit hemorrhage- EPI injections   . Small bowel obstruction (HCC)     Medications:  Prescriptions Prior to Admission  Medication Sig Dispense Refill Last Dose  . diltiazem (CARDIZEM CD) 120 MG 24 hr capsule Take 1 capsule (120 mg total) by mouth daily. 90 capsule 3 04/11/2016 at unknown  . dorzolamide (TRUSOPT) 2 % ophthalmic solution Place 1 drop into both eyes 2 (two) times daily.     04/10/2016 at Unknown time  . metoprolol tartrate (LOPRESSOR) 25 MG tablet Take 2  tablets (50 mg total) by mouth 2 (two) times daily. 90 tablet 6 04/11/2016 at 1130  . Multiple Vitamins-Minerals (CENTRUM) tablet Take 1 tablet by mouth daily.     04/10/2016 at Unknown time  . warfarin (COUMADIN) 7.5 MG tablet Take 5-7.5 mg by mouth See admin instructions. Take 7.5mg  tablet every day except on Fridays take 5mg  per patient and family members   04/10/2016 at 1800  . warfarin (COUMADIN) 5 MG tablet Take 1 tablet (5 mg total) by mouth as directed. (Patient not taking: Reported on 04/11/2016) 30 tablet 3 Not Taking at Unknown time   Scheduled:  . diltiazem  120 mg Oral Daily  . dofetilide  500 mcg Oral BID  . dorzolamide  1 drop Both Eyes BID  . lisinopril  5 mg Oral Daily  . metoprolol tartrate  50 mg Oral BID  . potassium chloride  20 mEq Oral Once  . sodium chloride flush  3 mL Intravenous Q12H  . Warfarin - Pharmacist Dosing Inpatient   Does not apply q1800   Infusions:    Assessment: 82yo male with history of Afib on warfarin PTA presents for initiation of tikosyn. INR has trended back down towards therapeutic after holding a dose. Will give a smaller dose tonight so that we aren't dropping too much. Pt converted to NSR overnight after initiation of tikosyn. Note from [name] mentions transitioning to apixaban in  4 weeks.  INR is now therapeutic. Will resume home dose.  PTA Warfarin Dose: 5mg  Fri and 7.5mg  AODs with last dose 04/10/16  Goal of Therapy:  INR 2-3 Monitor platelets by anticoagulation protocol: Yes   Plan:  Warfarin 7.5mg  tonight x1 Daily INR Monitor s/sx of bleeding  Andrey Cota. Diona Foley, PharmD, BCPS Clinical Pharmacist Pager [phone] 04/13/2016,7:49 AM

## 2016-04-13 NOTE — Progress Notes (Signed)
SUBJECTIVE: The patient is doing well today.  At this time, he denies chest pain, shortness of breath, or any new concerns.  . diltiazem  120 mg Oral Daily  . dofetilide  375 mcg Oral BID  . dorzolamide  1 drop Both Eyes BID  . lisinopril  5 mg Oral Daily  . metoprolol tartrate  50 mg Oral BID  . sodium chloride flush  3 mL Intravenous Q12H  . warfarin  7.5 mg Oral ONCE-1800  . Warfarin - Pharmacist Dosing Inpatient   Does not apply q1800      OBJECTIVE: Physical Exam: Vitals:   04/12/16 0404 04/12/16 1244 04/12/16 2003 04/13/16 0540  BP: (!) 141/84 132/79 (!) 141/80 137/83  Pulse: 61 63 62 60  Resp: 20 19 18 18   Temp: 97.5 F (36.4 C) 97.5 F (36.4 C) 97.5 F (36.4 C) 97.7 F (36.5 C)  TempSrc: Oral Oral Oral Oral  SpO2: 98% 98% 99% 99%  Weight: 237 lb 11.2 oz (107.8 kg)   230 lb 12.8 oz (104.7 kg)  Height:        Intake/Output Summary (Last 24 hours) at 04/13/16 [id number] Last data filed at 04/12/16 2005  Gross per 24 hour  Intake              720 ml  Output                0 ml  Net              720 ml    Telemetry reveals SR, 60's  GEN- The patient is well appearing, alert and oriented x 3 today.   Head- normocephalic, atraumatic Eyes-  Sclera clear, conjunctiva pink Ears- hearing intact Oropharynx- clear Neck- supple, no JVP Lungs- Clear to ausculation bilaterally, normal work of breathing Heart- Regular rate and rhythm, 1/6 SM, no rubs or gallops GI- soft, NT, ND Extremities- no clubbing, cyanosis, or edema Skin- no rash or lesion Psych- euthymic mood, full affect Neuro- no gross deficits appreciated  LABS: Basic Metabolic Panel:  Recent Labs  04/12/16 0322 04/13/16 0252  NA 139 139  K 3.9 3.9  CL 107 106  CO2 25 27  GLUCOSE 89 103*  BUN 13 12  CREATININE 1.02 0.89  CALCIUM 9.4 9.8  MG 1.9 2.4   RADIOLOGY: Dg Chest 2 View Result Date: 04/06/2016 CLINICAL DATA:  History of blood clots, patient has recently been placed on Coumadin, atrial  fibrillation EXAM: CHEST  2 VIEW COMPARISON:  Chest x-ray of 03/20/2015 FINDINGS: There is subtle nodularity at the lung bases and within the right mid upper lung field. Some these vague nodular opacities may be vessels overlapping, but small pulmonary nodules cannot be excluded. Either followup chest x-ray or preferably CT the chest is recommended to assess more sensitively. Bibasilar linear atelectasis or scarring is noted. The lungs remain somewhat hyperaerated with flattened hemidiaphragms suggesting an element of emphysema. Mediastinal and hilar contours are unremarkable. The heart is mildly enlarged and stable. The bones are osteopenic and there are mild compression deformities present in the mid and lower thoracic spine. IMPRESSION: 1. Subtle nodularity at the lung bases and in the right upper lung. Some of these opacities may be due to overlapping vasculature, but subtle pulmonary nodules cannot be excluded. Recommend unenhanced CT chest for further assessment. 2. Bibasilar linear atelectasis or scarring. 3. Probable emphysema. 4. Stable mild cardiomegaly. 5. Osteopenia and mild mid to lower thoracic compression deformities of uncertain age. Electronically Signed  By: Ivar Drape M.D.   On: 04/06/2016 15:47    ASSESSMENT AND PLAN:  1.  Persistent atrial fibrillation The patient has persistent atrial fibrillation and amiodarone was discontinued earlier this year with hyperthyroidism.  Amio level 01/28/16 0.3.  No LAA thrombus on TEE this admission LA 51 at echo 2010, our ability to maintain SR long term may be limited K+ 3.9 today, replaced Mag 2.4 Creat 0.89 (Calc Cr. Cl 91) QTc a little prolonged after last PM dose, will decrease his Tikosyn to 39mcg BID today  2.  Hyperthyroidism Followed by Dr Dwyane Dee  3.  HTN Stable No change required today  4.  Lung nodules Seen on recent CXR Chest CT recommended per radiology.  Will discuss tday with [name] f/u, plan  Tommye Standard,  PA-C 04/13/2016 9:18 AM

## 2016-04-14 ENCOUNTER — Telehealth: Payer: Self-pay | Admitting: Pharmacist

## 2016-04-14 LAB — BASIC METABOLIC PANEL
Anion gap: 7 (ref 5–15)
BUN: 13 mg/dL (ref 6–20)
CALCIUM: 9.4 mg/dL (ref 8.9–10.3)
CO2: 25 mmol/L (ref 22–32)
CREATININE: 0.94 mg/dL (ref 0.61–1.24)
Chloride: 106 mmol/L (ref 101–111)
GFR calc Af Amer: >60 mL/min (ref >60)
GLUCOSE: 88 mg/dL (ref 65–99)
Potassium: 3.8 mmol/L (ref 3.5–5.1)
Sodium: 138 mmol/L (ref 135–145)

## 2016-04-14 LAB — PROTIME-INR
INR: 2.37
Prothrombin Time: 26.3 seconds — ABNORMAL HIGH (ref 11.4–15.2)

## 2016-04-14 LAB — MAGNESIUM: MAGNESIUM: 2 mg/dL (ref 1.7–2.4)

## 2016-04-14 MED ORDER — DOFETILIDE 125 MCG PO CAPS: 375.0000 ug | ORAL_CAPSULE | Freq: Two times a day (BID) | ORAL | 3 refills | Status: DC

## 2016-04-14 MED ORDER — WARFARIN SODIUM 7.5 MG PO TABS: 7.5000 mg | ORAL_TABLET | Freq: Once | ORAL | Status: DC

## 2016-04-14 NOTE — Discharge Summary (Signed)
ELECTROPHYSIOLOGY PROCEDURE DISCHARGE SUMMARY    Patient ID: OSBON SVAY,  MRN: [id number], DOB/AGE: Mar 14, 1929 82 y.o.  Admit date: 04/11/2016 Discharge date: 04/14/2016  Primary Care Physician: Kathlene November, MD Electrophysiologist: Caryl Comes  Primary Discharge Diagnosis:  1.  Persistent atrial fibrillation status post Tikosyn loading this admission  Secondary Discharge Diagnosis:  1.  Tachycardia induced cardiomyopathy 2.  Hypertension 3.  Hyperlipidemia 4.  Prior Mallory-Weiss tear 5.  Previous amiodarone induced hyperthyrodism  Allergies  Allergen Reactions  . Amiodarone Other (See Comments)    Thyroid toxicity     Procedures This Admission:  1.  TEE on 04/11/16 by Dr Johnsie Cancel with EF 55-60%, no LAA thrombus  2.  Tikosyn loading   Brief HPI: Antonio [name] is a 82 y.o. male with a past medical history significant for persistent atrial fibrillation, hypertension, tachycardia induced cardiomyopathy, and prior GI bleed 2/2 Mallory-Weiss tear.  He was previously maintained on amiodarone but was found to be hyperthyroid and amiodarone was discontinued.  He had recurrent atrial fibrillation off of amiodarone and DCCV was pursued.  TEE showed left atrial thrombus and he was continued on rate control until today.  He has been seen by Dr Caryl Comes and [name], NP in the AF clinic and decision was made to proceed with Tikosyn load.  Amiodarone level 0.3 on 01/28/16.  He reported compliance with Eliquis prior to repeat TEE 02/08/16 but at that time was felt that thrombus has not resolved and Tikosyn admission was cancelled.  He was switched from Eliquis to Warfarin and has been followed with weekly INR's with plan today for repeat TEE and Tikosyn admission.   Hospital Course:  The patient was admitted and Tikosyn was initiated.  Renal function and electrolytes were followed during the hospitalization.  Their QTc remained stable when allowing for RBBB as reviewed by Dr Curt Bears.  They were  monitored until discharge on telemetry which demonstrated sinus rhythm.  On the day of discharge, they were examined by Dr Curt Bears who considered them stable for discharge to home.  Follow-up has been arranged with AF clinic in 1 week and with Dr Caryl Comes in 4 weeks.   Chest CT showed pulmonary nodules and repeat CT was recommended in 3-6 months. Discussed with family that they should follow up with PCP about this.   Physical Exam: Vitals:   04/13/16 1113 04/13/16 1305 04/13/16 2020 04/14/16 0417  BP: 126/86 122/71 136/83 (!) 151/87  Pulse: 60 (!) 56 61 (!) 57  Resp:  18 20 17   Temp:  97.4 F (36.3 C) 97.4 F (36.3 C) 97.6 F (36.4 C)  TempSrc:  Oral Oral Oral  SpO2:  99% 98% 98%  Weight:      Height:        GEN- The patient is elderly appearing, alert and oriented x 3 today.   HEENT: normocephalic, atraumatic; sclera clear, conjunctiva pink; hearing intact; oropharynx clear; neck supple  Lungs- Clear to ausculation bilaterally, normal work of breathing.  No wheezes, rales, rhonchi Heart- Regular rate and rhythm  GI- soft, non-tender, non-distended, bowel sounds present  Extremities- no clubbing, cyanosis, or edema; DP/PT/radial pulses 2+ bilaterally MS- no significant deformity or atrophy Skin- warm and dry, no rash or lesion Psych- euthymic mood, full affect Neuro- strength and sensation are intact   Labs:   Lab Results  Component Value Date   WBC 6.7 04/06/2016   HGB 14.9 04/06/2016   HCT 45.8 04/06/2016   MCV 95.2 04/06/2016  PLT 201 04/06/2016     Recent Labs Lab 04/14/16 0402  NA 138  K 3.8  CL 106  CO2 25  BUN 13  CREATININE 0.94  CALCIUM 9.4  GLUCOSE 88     Discharge Medications:    Medication List    TAKE these medications   CENTRUM tablet Take 1 tablet by mouth daily.   diltiazem 120 MG 24 hr capsule Commonly known as:  CARDIZEM CD Take 1 capsule (120 mg total) by mouth daily.   dofetilide 125 MCG capsule Commonly known as:  TIKOSYN Take  3 capsules (375 mcg total) by mouth 2 (two) times daily.   dorzolamide 2 % ophthalmic solution Commonly known as:  TRUSOPT Place 1 drop into both eyes 2 (two) times daily.   metoprolol tartrate 25 MG tablet Commonly known as:  LOPRESSOR Take 2 tablets (50 mg total) by mouth 2 (two) times daily.   warfarin 7.5 MG tablet Commonly known as:  COUMADIN Take 5-7.5 mg by mouth See admin instructions. Take 7.5mg  tablet every day except on Fridays take 5mg  per patient and family members What changed:  Another medication with the same name was removed. Continue taking this medication, and follow the directions you see here.       Disposition:  Discharge Instructions    Diet - low sodium heart healthy    Complete by:  As directed   Discharge instructions    Complete by:  As directed   Follow up with PCP - repeat Chest CT recommended in 3-6 months   Increase activity slowly    Complete by:  As directed     Follow-up Information    [facility] Follow up on 04/19/2016.   Specialty:  Cardiology Why:  at 10:45AM for coumadin check  Contact information: [address], [address] Follow up on 04/21/2016.   Specialty:  Cardiology Why:  at 10:30AM  Contact information: [address] [id number] [place]       Virl Axe, MD Follow up on 05/23/2016.   Specialty:  Cardiology Why:  at [place] information: [place] [phone]           Duration of Discharge Encounter: Greater than 30 minutes including physician time.  Signed, Chanetta Marshall, NP 04/14/2016 9:09 AM    I have seen and examined this patient with Chanetta Marshall.  Agree with above, note added to reflect my findings.  On exam, regular rhythm, no murmurs, lungs clear.  Admission for tikosyn load.  Currently  in sinus rhythm.  Plan for discharge and follow up in clinic.    Breeze Berringer M. Kalynn Declercq MD 04/14/2016 12:10 PM

## 2016-04-14 NOTE — Progress Notes (Signed)
Pt has been discharged home with family. Pt and pt's family received discharge instructions and all questions were answered. IV was removed with no complications. Pt received 7 day supply of tikosyn for discharge. Pt and pt's family were made aware that their pharmacy has a prescription for tikosyn if pt runs out of 7 day supply before they hear back from assistance program. Pt and pt's family verbalized understanding. Pt left with all of his belongings. Pt left the unit via wheelchair and was accompanied by pt's family and Actor. Pt was in no distress at time of discharge.   Grant Fontana BSN, RN

## 2016-04-14 NOTE — Progress Notes (Signed)
[place] for Warfarin Indication: atrial fibrillation  Allergies  Allergen Reactions  . Amiodarone Other (See Comments)    Thyroid toxicity    Patient Measurements: Height: 6\' 3"  (190.5 cm) Weight: 230 lb 12.8 oz (104.7 kg) IBW/kg (Calculated) : 84.5    Vital Signs: Temp: 97.6 F (36.4 C) (08/24 0417) Temp Source: Oral (08/24 0417) BP: 151/87 (08/24 0417) Pulse Rate: 57 (08/24 0417)  Labs:  Recent Labs  04/12/16 0322 04/13/16 0252 04/14/16 0402  LABPROT 34.4* 28.1* 26.3*  INR 3.32 2.57 2.37  CREATININE 1.02 0.89 0.94    Estimated Creatinine Clearance: 76.6 mL/min (by C-G formula based on SCr of 0.94 mg/dL).   Medical History: Past Medical History:  Diagnosis Date  . Anemia   . Appendicitis    SBP 7/09 ruputre w/ RLQ abscess 03/16/08  . Atrial fibrillation ([place])   . Basal cell carcinoma of nose 2016   Right side nasal tip, [name]  . BPH (benign prostatic hyperplasia) 06/29/2011  . Cardiomyopathy    secondary to Tachycardia. A-Acute on chronic admit for CHF 6/08. B- CHF coupled with atrial fib RVR electrophysiology study 09/27/07.  A- Multiple flutters and Atrial fib- no ablation performed b- ibutilide cardoversion c- amiordarone load- d/c in sinus rhyrthm d- coumadin anticoagulation- stopped at time of GI bleed 11/09  . Glaucoma   . History of blood transfusion 2009   "related to rupture in esophagus" (04/13/2016)  . Hyperlipidemia   . Hypertension   . Knee fracture, right 08/2014   "no OR" (04/13/2016)  . Lung nodules    [name] 04/13/2016  . Mallory - Weiss tear    esophagitis, 6 unit hemorrhage- EPI injections   . Small bowel obstruction (HCC)     Medications:  Prescriptions Prior to Admission  Medication Sig Dispense Refill Last Dose  . diltiazem (CARDIZEM CD) 120 MG 24 hr capsule Take 1 capsule (120 mg total) by mouth daily. 90 capsule 3 04/11/2016 at unknown  . dorzolamide (TRUSOPT) 2 % ophthalmic solution  Place 1 drop into both eyes 2 (two) times daily.     04/10/2016 at Unknown time  . metoprolol tartrate (LOPRESSOR) 25 MG tablet Take 2 tablets (50 mg total) by mouth 2 (two) times daily. 90 tablet 6 04/11/2016 at 1130  . Multiple Vitamins-Minerals (CENTRUM) tablet Take 1 tablet by mouth daily.     04/10/2016 at Unknown time  . warfarin (COUMADIN) 7.5 MG tablet Take 5-7.5 mg by mouth See admin instructions. Take 7.5mg  tablet every day except on Fridays take 5mg  per patient and family members   04/10/2016 at 1800  . warfarin (COUMADIN) 5 MG tablet Take 1 tablet (5 mg total) by mouth as directed. (Patient not taking: Reported on 04/11/2016) 30 tablet 3 Not Taking at Unknown time   Scheduled:  . diltiazem  120 mg Oral Daily  . dofetilide  375 mcg Oral BID  . dorzolamide  1 drop Both Eyes BID  . lisinopril  5 mg Oral Daily  . metoprolol tartrate  50 mg Oral BID  . sodium chloride flush  3 mL Intravenous Q12H  . Warfarin - Pharmacist Dosing Inpatient   Does not apply q1800   Infusions:    Assessment: 82yo male with history of Afib on warfarin PTA presents for initiation of tikosyn. INR has trended back down towards therapeutic after holding a dose. Pt converted to NSR  after initiation of tikosyn. Note from [name] mentions transitioning to apixaban in 4 weeks.  PTA Warfarin Dose: 5mg  Fri and 7.5mg  AODs with last dose 04/10/16  Goal of Therapy:  INR 2-3 Monitor platelets by anticoagulation protocol: Yes   Plan:  Warfarin 7.5mg  tonight x1 Daily INR  Hildred Laser, Pharm D 04/14/2016 8:59 AM

## 2016-04-14 NOTE — Telephone Encounter (Signed)
[facility] Patient Assistance form faxed 8/24.

## 2016-04-14 NOTE — Care Management Note (Signed)
Case Management Note Marvetta Gibbons RN, BSN Unit 2W-Case Manager [phone]  Patient Details  Name: MASSIAH BULLS MRN: [id number] Date of Birth: 1929-05-04  Subjective/Objective:     Pt admitted with afib for Tikosyn load               Action/Plan: PTA pt lived at home with spouse- referral received for Tikosyn assistance- per insurance check pt does not have part D-spoke with pt at bedside- and confirmed that pt has no medication coverage- does not have part D- pt reports that he has assistance for Eliquis- he states that the MD office gave him and his daughter paperwork to fill out for Tikosyn - pt gave this CM permission to call his daughter [name]- call made to [place]- who confirmed that she has the paperwork for pt assistance with Tikosyn- she is working on filling out and will bring it in to have MD/PA sign here- (will place it on shadow chart for signature)- once signed she was told to bring it back to office for them to fax in to pt assistance. Pt will need 7 day supply on discharge from [facility]- have explained this to daughter- have also explained to daughter about outside pharmacy needing to order drug in stock once they have script.   Expected Discharge Date:     04/14/16             Expected Discharge Plan:  Home/Self Care  In-House Referral:     Discharge planning Services  CM Consult, Medication Assistance  Post Acute Care Choice:    Choice offered to:     DME Arranged:    DME Agency:     HH Arranged:    HH Agency:     Status of Service:  Completed, signed off  If discussed at [employer] of Stay Meetings, dates discussed:    Additional Comments:  04/14/16- 1000- Marvetta Gibbons RN, CM- daughter brought in pt assistance form for Tikosyn- rounding PA has signed form and form has been given back to daughter at bedside to f/u with MD office for further assistance- 7 day script has been provided and has been sent to [facility] to fill- bedside RN will pick up  when ready and give to pt prior to discharge.   Dawayne Patricia, RN 04/14/2016, 10:09 AM

## 2016-04-14 NOTE — Care Management Important Message (Signed)
Important Message  Patient Details  Name: Antonio [name] MRN: [id number] Date of Birth: 04/10/1929   Medicare Important Message Given:  Yes    Heavenly Christine Abena 04/14/2016, 11:28 AM

## 2016-04-15 ENCOUNTER — Other Ambulatory Visit (HOSPITAL_COMMUNITY): Payer: Self-pay | Admitting: *Deleted

## 2016-04-15 ENCOUNTER — Telehealth: Payer: Self-pay | Admitting: Internal Medicine

## 2016-04-15 MED ORDER — DILTIAZEM HCL ER COATED BEADS 120 MG PO CP24: 120.0000 mg | ORAL_CAPSULE | Freq: Every day | ORAL | 3 refills | Status: AC

## 2016-04-15 NOTE — Telephone Encounter (Signed)
I called and spoke with the patient's daughter, Antonio [name] ([place]). She states there was a call from our office # and they were trying to identify the caller.  I advised I did not see where a call was made, but we did review the patient's upcoming appts- all verified. I advised that [place] D had faxed the patient's forms for Tikosyn patient assistance yesterday.  Dee verbalized understanding and is agreeable with all appointments.

## 2016-04-15 NOTE — Telephone Encounter (Signed)
New Message  Pt voiced she's returning a call where someone called her earlier.  I contacted the AFIB clinic due to chart but Estill Bamberg and pt voiced they've already spoken.  Looked in encounters, notes, labs, & imaging. Not notation nurse called.  Please follow up with pt. Thanks!

## 2016-04-19 ENCOUNTER — Ambulatory Visit (INDEPENDENT_AMBULATORY_CARE_PROVIDER_SITE_OTHER): Payer: Medicare Other | Admitting: Pharmacist

## 2016-04-19 DIAGNOSIS — I482 Chronic atrial fibrillation, unspecified: Secondary | ICD-10-CM

## 2016-04-19 DIAGNOSIS — Z5181 Encounter for therapeutic drug level monitoring: Secondary | ICD-10-CM

## 2016-04-19 DIAGNOSIS — I4821 Permanent atrial fibrillation: Secondary | ICD-10-CM

## 2016-04-19 LAB — POCT INR: INR: 3

## 2016-04-21 ENCOUNTER — Ambulatory Visit (HOSPITAL_COMMUNITY)
Admission: RE | Admit: 2016-04-21 | Discharge: 2016-04-21 | Disposition: A | Discharge status: Home / Self Care | Payer: Medicare Other | Source: Ambulatory Visit | Attending: Nurse Practitioner | Admitting: Nurse Practitioner

## 2016-04-21 ENCOUNTER — Encounter (HOSPITAL_COMMUNITY): Payer: Self-pay | Admitting: Nurse Practitioner

## 2016-04-21 VITALS — BP 118/76 | HR 88 | Ht 75.0 in | Wt 239.4 lb

## 2016-04-21 DIAGNOSIS — H47233 Glaucomatous optic atrophy, bilateral: Secondary | ICD-10-CM | POA: Diagnosis not present

## 2016-04-21 DIAGNOSIS — Z888 Allergy status to other drugs, medicaments and biological substances status: Secondary | ICD-10-CM | POA: Insufficient documentation

## 2016-04-21 DIAGNOSIS — H18413 Arcus senilis, bilateral: Secondary | ICD-10-CM | POA: Diagnosis not present

## 2016-04-21 DIAGNOSIS — R918 Other nonspecific abnormal finding of lung field: Secondary | ICD-10-CM | POA: Diagnosis not present

## 2016-04-21 DIAGNOSIS — H04123 Dry eye syndrome of bilateral lacrimal glands: Secondary | ICD-10-CM | POA: Diagnosis not present

## 2016-04-21 DIAGNOSIS — Z86718 Personal history of other venous thrombosis and embolism: Secondary | ICD-10-CM | POA: Insufficient documentation

## 2016-04-21 DIAGNOSIS — E0590 Thyrotoxicosis, unspecified without thyrotoxic crisis or storm: Secondary | ICD-10-CM | POA: Insufficient documentation

## 2016-04-21 DIAGNOSIS — I4819 Other persistent atrial fibrillation: Secondary | ICD-10-CM

## 2016-04-21 DIAGNOSIS — Z7901 Long term (current) use of anticoagulants: Secondary | ICD-10-CM | POA: Diagnosis not present

## 2016-04-21 DIAGNOSIS — Z79899 Other long term (current) drug therapy: Secondary | ICD-10-CM | POA: Diagnosis not present

## 2016-04-21 DIAGNOSIS — Z9849 Cataract extraction status, unspecified eye: Secondary | ICD-10-CM | POA: Diagnosis not present

## 2016-04-21 DIAGNOSIS — Z85828 Personal history of other malignant neoplasm of skin: Secondary | ICD-10-CM | POA: Insufficient documentation

## 2016-04-21 DIAGNOSIS — I481 Persistent atrial fibrillation: Secondary | ICD-10-CM | POA: Insufficient documentation

## 2016-04-21 DIAGNOSIS — H353131 Nonexudative age-related macular degeneration, bilateral, early dry stage: Secondary | ICD-10-CM | POA: Diagnosis not present

## 2016-04-21 DIAGNOSIS — H409 Unspecified glaucoma: Secondary | ICD-10-CM | POA: Diagnosis not present

## 2016-04-21 DIAGNOSIS — Z87891 Personal history of nicotine dependence: Secondary | ICD-10-CM | POA: Diagnosis not present

## 2016-04-21 DIAGNOSIS — E785 Hyperlipidemia, unspecified: Secondary | ICD-10-CM | POA: Diagnosis not present

## 2016-04-21 DIAGNOSIS — I10 Essential (primary) hypertension: Secondary | ICD-10-CM | POA: Diagnosis not present

## 2016-04-21 DIAGNOSIS — H11423 Conjunctival edema, bilateral: Secondary | ICD-10-CM | POA: Diagnosis not present

## 2016-04-21 DIAGNOSIS — H401113 Primary open-angle glaucoma, right eye, severe stage: Secondary | ICD-10-CM | POA: Diagnosis not present

## 2016-04-21 DIAGNOSIS — H11153 Pinguecula, bilateral: Secondary | ICD-10-CM | POA: Diagnosis not present

## 2016-04-21 LAB — BASIC METABOLIC PANEL
ANION GAP: 8 (ref 5–15)
BUN: 19 mg/dL (ref 6–20)
CALCIUM: 10 mg/dL (ref 8.9–10.3)
CO2: 25 mmol/L (ref 22–32)
CREATININE: 1.04 mg/dL (ref 0.61–1.24)
Chloride: 106 mmol/L (ref 101–111)
GFR calc Af Amer: >60 mL/min (ref >60)
GLUCOSE: 138 mg/dL — AB (ref 65–99)
Potassium: 4 mmol/L (ref 3.5–5.1)
Sodium: 139 mmol/L (ref 135–145)

## 2016-04-21 LAB — MAGNESIUM: Magnesium: 1.9 mg/dL (ref 1.7–2.4)

## 2016-04-21 NOTE — Progress Notes (Signed)
Patient ID: Antonio [name], male   DOB: February 22, 1929, 82 y.o.   MRN: [id number]     Primary Care Physician: Kathlene November, MD Referring Physician: [name], PA Electrophysiologist: [name] Cardiologist: None   Antonio [name] is a 82 y.o. male with a h/o afib that had maintained SR on amiodarone x 7-8 years. Prior to that had [place] which normalized with return of SR.  While visiting with his daughter several weeks ago in [place], [place], he had a syncopal episode. He was hospitalized at [facility] was found to hyperthyroid with afib RVR. Amiodarone was d/ced. He was off anticoagulation due to remote  GI bleed and pt's desires  not to resume blood thinners. Plans were made for direct current cardioversion, however a TEE was performed which showed a left atrial thrombus. Left ventricular ejection fraction was normal at 55-60%.Chadsvasc score  at least 3. There were no significant valve abnormalities. Due to visualization of a left atrial thrombus, his cardioversion was canceled. He was placed on anticoagulation therapy with Eliquis, 5 mg twice a day. He was treated with rate control agents, Cardizem 120 mg in addition to Metroprolol 12.5 mg twice a day. He was instructed to continue aspirin along with Eliquis.  He was seen by [name], [place] 5/8 and referred here for further addressing of restoring SR with AAD's and repeat cardioversion when  thought to be safe to resume when thyroid  normalized and thrombus dissolved. Pt did not sign up for Medicare D at 84 and currently has no drug coverage. Daughter is trying to get him signed up for drug coverage but it will not be before October. If [name] is used, cost may be a big concern, unless pt qualifies for pt assistance. QTc today is 523 ms, but RBBB is contributing.Qtc in SR around 440 ms. Last TSH drawn yesterday shows that thyroid is improving off amiodarone and on methimazole 10 mg daily. TSH 0.07 3 months ago and 6/7 0.31 and  methimazole reduced to 1 tab daily by [name].   Pt is symptomatic with Afib and is c/o fatigue and shortness of breath with activity. No PND, orthopnea, appears normvolemic. Off amiodarone around 6 weeks now. Rate control is reasonable and reports HR in 80's/low 90's at home. On BB/CCB.  Returns 7/10, he was set up for Tikosyn, hospitalization preceded with TEE to make sure left thrombus was resolved. Unfortunately, It was not. Plans for tikosyn were postponed and decision was made to start coumadin, stop DOAC. He has been on coumadin for 3 weeks, but has only had one INR that was therapeutic and last one was 1.3. He will have to have 3-4 weeks of therapeutic INR's before repeat TEE can be entertained. He has had lower extremity swelling which he thinks has come about since starting diltiazem. Since his thyroid is normalizing off amiodarone, his HR is better controlled in the 60's and may be able to do without cardizem now. Tolerating afib better at slower v rate.  He returns afib clinic 8/16 and has had 3 successive weekly therapeutic INR's and is pending the 4th INR on Monday. If therapeutic, plans are for TEE and admission for tikosyn that same day. He is now off methimazole, stopped when he saw [name] 8/14. Now TSH is chronically high. Pt is c/o of fatigue and shortness of breath. He has RVR today at 122 bpm. LLE did not improve with stopping Cardizem in July. Weight is up gradually 6 lbs since early  July , I think this may be weight gain and not fluid since when he developed hyperthyroidism, his weight dropped to 228 from 260. He is not admitting to PND/orthopnea.   Returns 8/31, after hospitalization for Tikosyn. He did convert to SR and did not require cardioversion. Repeat TEE revealed dissolved clot. He felt very well on d/c from hospital but last few days not as well. EKG shows aflutter with variable AV block, qtc 522. Last QTC in hospital SR 528, with RBBB contributing.. The daughter thinks  he had a hard coughing spell and  he may have had returned to afib at that time. Tikosyn is being taken properly. Warfarin checked 8/29 and is 3.0. He does feel better that prior to hospitalization and v rate controlled. LLE much improved.  Today, he denies symptoms of  chest pain,  orthopnea, PND, lower extremity edema, dizziness, presyncope, syncope, or neurologic sequela. Positive for fatigue The patient is tolerating medications without difficulties and is otherwise without complaint today.   Past Medical History:  Diagnosis Date  . Anemia   . Appendicitis    SBP 7/09 ruputre w/ RLQ abscess 03/16/08  . Atrial fibrillation ([place])   . Basal cell carcinoma of nose 2016   Right side nasal tip, [name]  . BPH (benign prostatic hyperplasia) 06/29/2011  . Cardiomyopathy    secondary to Tachycardia. A-Acute on chronic admit for CHF 6/08. B- CHF coupled with atrial fib RVR electrophysiology study 09/27/07.  A- Multiple flutters and Atrial fib- no ablation performed b- ibutilide cardoversion c- amiordarone load- d/c in sinus rhyrthm d- coumadin anticoagulation- stopped at time of GI bleed 11/09  . Glaucoma   . History of blood transfusion 2009   "related to rupture in esophagus" (04/13/2016)  . Hyperlipidemia   . Hypertension   . Knee fracture, right 08/2014   "no OR" (04/13/2016)  . Lung nodules    [name] 04/13/2016  . Mallory - Weiss tear    esophagitis, 6 unit hemorrhage- EPI injections   . Small bowel obstruction [facility])    Past Surgical History:  Procedure Laterality Date  . APPENDECTOMY  02/2008   exploratory, ruptured appendix, RLQ abscess  . BASAL CELL CARCINOMA EXCISION     nose  . CATARACT EXTRACTION W/ INTRAOCULAR LENS  IMPLANT, BILATERAL Bilateral   . COLONOSCOPY    . COLONOSCOPY W/ BIOPSIES AND POLYPECTOMY  ~ 2015  . EP study  09/27/07   see pmh  . EXPLORATORY LAPAROTOMY  02/2008  . HEMORRHOID SURGERY    . TEE WITHOUT CARDIOVERSION N/A 02/08/2016   Procedure: TRANSESOPHAGEAL  ECHOCARDIOGRAM (TEE);  Surgeon: Fay Records, MD;  Location: [facility] ENDOSCOPY;  Service: Cardiovascular;  Laterality: N/A;  . TEE WITHOUT CARDIOVERSION N/A 04/11/2016   Procedure: TRANSESOPHAGEAL ECHOCARDIOGRAM (TEE);  Surgeon: Josue Hector, MD;  Location[facility] ENDOSCOPY;  Service: Cardiovascular;  Laterality: N/A;  . TEE/DCCV  02/07/07    Current Outpatient Prescriptions  Medication Sig Dispense Refill  . diltiazem (CARDIZEM CD) 120 MG 24 hr capsule Take 1 capsule (120 mg total) by mouth daily. 90 capsule 3  . dofetilide (TIKOSYN) 125 MCG capsule Take 125 mcg by mouth 2 (two) times daily.    Marland Kitchen dofetilide (TIKOSYN) 250 MCG capsule Take 250 mcg by mouth 2 (two) times daily.    . dorzolamide (TRUSOPT) 2 % ophthalmic solution Place 1 drop into both eyes 2 (two) times daily.      . metoprolol tartrate (LOPRESSOR) 25 MG tablet Take 2 tablets (50 mg  total) by mouth 2 (two) times daily. 90 tablet 6  . Multiple Vitamins-Minerals (CENTRUM) tablet Take 1 tablet by mouth daily.      Marland Kitchen warfarin (COUMADIN) 7.5 MG tablet Take 5-7.5 mg by mouth See admin instructions. Take 7.5mg  tablet every day except on Fridays take 5mg  per patient and family members     No current facility-administered medications for this encounter.     Allergies  Allergen Reactions  . Amiodarone Other (See Comments)    Thyroid toxicity    Social History   Social History  . Marital status: Married    Spouse name: N/A  . Number of children: 3  . Years of education: N/A   Occupational History  . Cabinetmaker     works part time   Social History Main Topics  . Smoking status: Former Smoker    Packs/day: 1.00    Years: 23.00    Quit date: 06/30/1971  . Smokeless tobacco: Never Used  . Alcohol use No  . Drug use: No  . Sexual activity: No   Other Topics Concern  . Not on file   Social History Narrative   Household-- pt and wife (wife developing dementia)   One son lives in [place], goes there from time to time               Family History  Problem Relation Age of Onset  . Uterine cancer Mother   . Heart attack Maternal Uncle   . Heart disease Maternal Uncle   . Colon cancer Neg Hx   . Prostate cancer Neg Hx   . Diabetes Neg Hx     ROS- All systems are reviewed and negative except as per the HPI above  Physical Exam: Vitals:   04/21/16 1048  BP: 118/76  Pulse: 88  Weight: 239 lb 6.4 oz (108.6 kg)  Height: 6\' 3"  (1.905 m)    GEN- The patient is well appearing, alert and oriented x 3 today.   Head- normocephalic, atraumatic Eyes-  Sclera clear, conjunctiva pink Ears- hearing intact Oropharynx- clear Neck- supple, no JVP Lymph- no cervical lymphadenopathy Lungs- Clear to ausculation bilaterally, normal work of breathing Heart- Irregular rate and rhythm, no murmurs, rubs or gallops, PMI not laterally displaced GI- soft, NT, ND, + BS Extremities- no clubbing, cyanosis,  2+ bilateral edema MS- no significant deformity or atrophy Skin- no rash or lesion Psych- euthymic mood, full affect Neuro- strength and sensation are intact  EKG-Aflutter with v rate 88 bpm,  RBBB, qrs int 154 ms, Qtc 522 ms Epic records reviewed CT chest w/o contrast- IMPRESSION: Multiple small bilateral pulmonary nodules are identified and cannot be definitively characterized. Non-contrast chest CT at 3-6 months is recommended. If the nodules are stable at time of repeat CT, then future CT at 18-24 months (from today's scan) is considered optional for low-risk patients, but is recommended for high-risk patients. This recommendation follows the consensus statement: Guidelines for Management of Incidental Pulmonary Nodules Detected on CT Images:From the Fleischner Society 2017; published online before print (10.1148/radiol.SG:5268862).  Small bilateral pleural effusions.  Cardiomegaly.  Calcific aortic and coronary atherosclerosis.  Multiple thoracic spine compression fractures. The T7  compression fracture appears new since 2016 the cannot be definitively Characterized.  Assessment and Plan: 1.Persistent symptomatic  afib secondary to thyroid toxicity  Left hospital in SR after tikosyn load but in a flutter today, rate controlled. Will bring back 2 weeks and if remains out of rhythm will schedule for cardioversion Continue  with metoprolol and cardizem Continue warfarin  2. Hyperthyroidism Off methimazole  Per Endocrinologist  3.Persisitent left atrial thrombus Resolved with last TEE  4. Pulmonology nodules Abnormal CXR CT obtained in hospital with recommendation to f/u at 3-6 months for stability of nodules  F/u in 2 weeks  Antonio [name], [facility] [address] [place], [place] [address] [phone]

## 2016-04-22 ENCOUNTER — Telehealth: Payer: Self-pay

## 2016-04-22 NOTE — Telephone Encounter (Addendum)
Tikosyn 125 mcg, 9 bottles arrived from [facility] today. Patient notified that it will be placed at front desk.

## 2016-04-29 ENCOUNTER — Encounter: Payer: Self-pay | Admitting: Internal Medicine

## 2016-05-03 ENCOUNTER — Other Ambulatory Visit: Payer: Self-pay | Admitting: *Deleted

## 2016-05-03 ENCOUNTER — Ambulatory Visit (HOSPITAL_COMMUNITY)
Admission: RE | Admit: 2016-05-03 | Discharge: 2016-05-03 | Disposition: A | Discharge status: Home / Self Care | Payer: Medicare Other | Source: Ambulatory Visit | Attending: Nurse Practitioner | Admitting: Nurse Practitioner

## 2016-05-03 ENCOUNTER — Ambulatory Visit (INDEPENDENT_AMBULATORY_CARE_PROVIDER_SITE_OTHER): Payer: Medicare Other | Admitting: *Deleted

## 2016-05-03 ENCOUNTER — Encounter (HOSPITAL_COMMUNITY): Payer: Self-pay | Admitting: Nurse Practitioner

## 2016-05-03 VITALS — BP 118/62 | HR 90 | Ht 75.0 in | Wt 244.0 lb

## 2016-05-03 DIAGNOSIS — Z7901 Long term (current) use of anticoagulants: Secondary | ICD-10-CM | POA: Diagnosis not present

## 2016-05-03 DIAGNOSIS — Z87891 Personal history of nicotine dependence: Secondary | ICD-10-CM | POA: Insufficient documentation

## 2016-05-03 DIAGNOSIS — I509 Heart failure, unspecified: Secondary | ICD-10-CM | POA: Insufficient documentation

## 2016-05-03 DIAGNOSIS — Z9841 Cataract extraction status, right eye: Secondary | ICD-10-CM | POA: Diagnosis not present

## 2016-05-03 DIAGNOSIS — N400 Enlarged prostate without lower urinary tract symptoms: Secondary | ICD-10-CM | POA: Diagnosis not present

## 2016-05-03 DIAGNOSIS — R918 Other nonspecific abnormal finding of lung field: Secondary | ICD-10-CM | POA: Insufficient documentation

## 2016-05-03 DIAGNOSIS — I481 Persistent atrial fibrillation: Secondary | ICD-10-CM

## 2016-05-03 DIAGNOSIS — E785 Hyperlipidemia, unspecified: Secondary | ICD-10-CM | POA: Diagnosis not present

## 2016-05-03 DIAGNOSIS — R400 Somnolence: Secondary | ICD-10-CM | POA: Insufficient documentation

## 2016-05-03 DIAGNOSIS — E0590 Thyrotoxicosis, unspecified without thyrotoxic crisis or storm: Secondary | ICD-10-CM | POA: Diagnosis not present

## 2016-05-03 DIAGNOSIS — I4819 Other persistent atrial fibrillation: Secondary | ICD-10-CM

## 2016-05-03 DIAGNOSIS — I429 Cardiomyopathy, unspecified: Secondary | ICD-10-CM | POA: Insufficient documentation

## 2016-05-03 DIAGNOSIS — I482 Chronic atrial fibrillation, unspecified: Secondary | ICD-10-CM

## 2016-05-03 DIAGNOSIS — Z9842 Cataract extraction status, left eye: Secondary | ICD-10-CM | POA: Diagnosis not present

## 2016-05-03 DIAGNOSIS — Z8049 Family history of malignant neoplasm of other genital organs: Secondary | ICD-10-CM | POA: Insufficient documentation

## 2016-05-03 DIAGNOSIS — H409 Unspecified glaucoma: Secondary | ICD-10-CM | POA: Insufficient documentation

## 2016-05-03 DIAGNOSIS — Z5181 Encounter for therapeutic drug level monitoring: Secondary | ICD-10-CM | POA: Diagnosis not present

## 2016-05-03 DIAGNOSIS — D649 Anemia, unspecified: Secondary | ICD-10-CM | POA: Diagnosis not present

## 2016-05-03 DIAGNOSIS — I213 ST elevation (STEMI) myocardial infarction of unspecified site: Secondary | ICD-10-CM | POA: Insufficient documentation

## 2016-05-03 DIAGNOSIS — Z888 Allergy status to other drugs, medicaments and biological substances status: Secondary | ICD-10-CM | POA: Insufficient documentation

## 2016-05-03 DIAGNOSIS — Z833 Family history of diabetes mellitus: Secondary | ICD-10-CM | POA: Insufficient documentation

## 2016-05-03 DIAGNOSIS — Z8042 Family history of malignant neoplasm of prostate: Secondary | ICD-10-CM | POA: Insufficient documentation

## 2016-05-03 DIAGNOSIS — Z85828 Personal history of other malignant neoplasm of skin: Secondary | ICD-10-CM | POA: Diagnosis not present

## 2016-05-03 DIAGNOSIS — Z8249 Family history of ischemic heart disease and other diseases of the circulatory system: Secondary | ICD-10-CM | POA: Diagnosis not present

## 2016-05-03 DIAGNOSIS — I4821 Permanent atrial fibrillation: Secondary | ICD-10-CM

## 2016-05-03 DIAGNOSIS — I110 Hypertensive heart disease with heart failure: Secondary | ICD-10-CM | POA: Insufficient documentation

## 2016-05-03 DIAGNOSIS — Z800 Family history of malignant neoplasm of digestive organs: Secondary | ICD-10-CM | POA: Insufficient documentation

## 2016-05-03 DIAGNOSIS — I4891 Unspecified atrial fibrillation: Secondary | ICD-10-CM | POA: Diagnosis not present

## 2016-05-03 DIAGNOSIS — G4733 Obstructive sleep apnea (adult) (pediatric): Secondary | ICD-10-CM

## 2016-05-03 LAB — BASIC METABOLIC PANEL
Anion gap: 7 (ref 5–15)
BUN: 22 mg/dL — AB (ref 6–20)
CALCIUM: 10.2 mg/dL (ref 8.9–10.3)
CHLORIDE: 107 mmol/L (ref 101–111)
CO2: 27 mmol/L (ref 22–32)
CREATININE: 1.02 mg/dL (ref 0.61–1.24)
GFR calc Af Amer: >60 mL/min (ref >60)
GFR calc non Af Amer: >60 mL/min (ref >60)
GLUCOSE: 96 mg/dL (ref 65–99)
Potassium: 4.3 mmol/L (ref 3.5–5.1)
Sodium: 141 mmol/L (ref 135–145)

## 2016-05-03 LAB — CBC
HEMATOCRIT: 47.4 % (ref 39.0–52.0)
HEMOGLOBIN: 15.8 g/dL (ref 13.0–17.0)
MCH: 31.1 pg (ref 26.0–34.0)
MCHC: 33.3 g/dL (ref 30.0–36.0)
MCV: 93.3 fL (ref 78.0–100.0)
Platelets: 210 10*3/uL (ref 150–400)
RBC: 5.08 MIL/uL (ref 4.22–5.81)
RDW: 13.9 % (ref 11.5–15.5)
WBC: 6.3 10*3/uL (ref 4.0–10.5)

## 2016-05-03 LAB — POCT INR: INR: 3

## 2016-05-03 NOTE — Patient Instructions (Signed)
Cardioversion scheduled for Monday, September 18th  - Arrive at the [facility] and go to admitting at [facility] not eat or drink anything after midnight the night prior to your procedure.  - Take all your medication with a sip of water prior to arrival.  - You will not be able to drive home after your procedure.

## 2016-05-03 NOTE — Progress Notes (Signed)
Patient ID: MELO OSMANI, male   DOB: [birth year], 82 y.o.   MRN: [id number]     Primary Care Physician: Kathlene November, MD Referring Physician: [name], PA Electrophysiologist: [name] Cardiologist: None   KEANEN THIESEN is a 82 y.o. male with a h/o afib that had maintained SR on amiodarone x 7-8 years. Prior to that had [place] which normalized with return of SR.  While visiting with his daughter several weeks ago in [place], [place], he had a syncopal episode. He was hospitalized at [facility] was found to hyperthyroid with afib RVR. Amiodarone was d/ced. He was off anticoagulation due to remote  GI bleed and pt's desires  not to resume blood thinners. Plans were made for direct current cardioversion, however a TEE was performed which showed a left atrial thrombus. Left ventricular ejection fraction was normal at 55-60%.Chadsvasc score  at least 3. There were no significant valve abnormalities. Due to visualization of a left atrial thrombus, his cardioversion was canceled. He was placed on anticoagulation therapy with Eliquis, 5 mg twice a day. He was treated with rate control agents, Cardizem 120 mg in addition to Metroprolol 12.5 mg twice a day. He was instructed to continue aspirin along with Eliquis.  He was seen by [name], [place] 5/8 and referred to afib clinic for further addressing of restoring SR with AAD's and repeat cardioversion, when  thought to be safe to resume when thyroid  normalized and thrombus dissolved.QTc today is 523 ms, but RBBB is contributing.Qtc in SR around 440 ms. Last TSH drawn yesterday shows that thyroid is improving off amiodarone and on methimazole 10 mg daily. TSH 0.07 3 months ago and 6/7 0.31 and methimazole reduced to 1 tab daily by [name].   Pt is symptomatic with Afib and is c/o fatigue and shortness of breath with activity. No PND, orthopnea, appears normvolemic. Off amiodarone around 6 weeks now. Rate control is reasonable and  reports HR in 80's/low 90's at home. On BB/CCB.  Returns 7/10, he was set up for Tikosyn, hospitalization preceded with TEE to make sure left thrombus was resolved. Unfortunately, It was not. Plans for tikosyn were postponed and decision was made to start coumadin, stop DOAC. He has been on coumadin for 3 weeks, but has only had one INR that was therapeutic and last one was 1.3. He will have to have 3-4 weeks of therapeutic INR's before repeat TEE can be entertained. He has had lower extremity swelling which he thinks has come about since starting diltiazem. Since his thyroid is normalizing off amiodarone, his HR is better controlled in the 60's and may be able to do without cardizem now. Tolerating afib better at slower v rate.  He returns afib clinic 8/16 and has had 3 successive weekly therapeutic INR's and is pending the 4th INR on Monday. If therapeutic, plans are for TEE and admission for tikosyn that same day. He is now off methimazole, stopped when he saw [name] 8/14. Now TSH is chronically high. Pt is c/o of fatigue and shortness of breath. He has RVR today at 122 bpm. LLE did not improve with stopping Cardizem in July. Weight is up gradually 6 lbs since early July , I think this may be weight gain and not fluid since when he developed hyperthyroidism, his weight dropped to 228 from 260. He is not admitting to PND/orthopnea.   Returns 8/31, after hospitalization for Tikosyn. He did convert to SR and did not require cardioversion. Repeat  TEE revealed dissolved clot. He felt very well on d/c from hospital but last few days not as well. EKG shows aflutter with variable AV block, qtc 522. Last QTC in hospital SR 528, with RBBB contributing.. The daughter thinks he had a hard coughing spell and  he may have had returned to afib at that time. Tikosyn is being taken properly. Warfarin checked 8/29 and is 3.0. He does feel better that prior to hospitalization and v rate controlled. LLE much  improved.  Returns to afib clinic 9/12 and remains in afib with cvr.  The plan is to try cardioversion on tikosyn before saying he failed drug. The daughter reports that since she has spent more time with her father lately, she has noticed that he falls asleep easily and has spells of not breathing when asleep. Will order sleep study.   Today, he denies symptoms of  chest pain,  orthopnea, PND, lower extremity edema, dizziness, presyncope, syncope, or neurologic sequela. Positive for fatigue The patient is tolerating medications without difficulties and is otherwise without complaint today.   Past Medical History:  Diagnosis Date  . Anemia   . Appendicitis    SBP 7/09 ruputre w/ RLQ abscess 03/16/08  . Atrial fibrillation ([place])   . Basal cell carcinoma of nose 2016   Right side nasal tip, [name]  . BPH (benign prostatic hyperplasia) 06/29/2011  . Cardiomyopathy    secondary to Tachycardia. A-Acute on chronic admit for CHF 6/08. B- CHF coupled with atrial fib RVR electrophysiology study 09/27/07.  A- Multiple flutters and Atrial fib- no ablation performed b- ibutilide cardoversion c- amiordarone load- d/c in sinus rhyrthm d- coumadin anticoagulation- stopped at time of GI bleed 11/09  . Glaucoma   . History of blood transfusion 2009   "related to rupture in esophagus" (04/13/2016)  . Hyperlipidemia   . Hypertension   . Knee fracture, right 08/2014   "no OR" (04/13/2016)  . Lung nodules    [name] 04/13/2016  . Mallory - Weiss tear    esophagitis, 6 unit hemorrhage- EPI injections   . Small bowel obstruction [facility])    Past Surgical History:  Procedure Laterality Date  . APPENDECTOMY  02/2008   exploratory, ruptured appendix, RLQ abscess  . BASAL CELL CARCINOMA EXCISION     nose  . CATARACT EXTRACTION W/ INTRAOCULAR LENS  IMPLANT, BILATERAL Bilateral   . COLONOSCOPY    . COLONOSCOPY W/ BIOPSIES AND POLYPECTOMY  ~ 2015  . EP study  09/27/07   see pmh  . EXPLORATORY LAPAROTOMY  02/2008   . HEMORRHOID SURGERY    . TEE WITHOUT CARDIOVERSION N/A 02/08/2016   Procedure: TRANSESOPHAGEAL ECHOCARDIOGRAM (TEE);  Surgeon: Fay Records, MD;  Location: [facility] ENDOSCOPY;  Service: Cardiovascular;  Laterality: N/A;  . TEE WITHOUT CARDIOVERSION N/A 04/11/2016   Procedure: TRANSESOPHAGEAL ECHOCARDIOGRAM (TEE);  Surgeon: Josue Hector, MD;  Location: [facility] ENDOSCOPY;  Service: Cardiovascular;  Laterality: N/A;  . TEE/DCCV  02/07/07    Current Outpatient Prescriptions  Medication Sig Dispense Refill  . diltiazem (CARDIZEM CD) 120 MG 24 hr capsule Take 1 capsule (120 mg total) by mouth daily. 90 capsule 3  . dofetilide (TIKOSYN) 125 MCG capsule Take 125 mcg by mouth 2 (two) times daily.    Marland Kitchen dofetilide (TIKOSYN) 250 MCG capsule Take 250 mcg by mouth 2 (two) times daily.    . dorzolamide (TRUSOPT) 2 % ophthalmic solution Place 1 drop into both eyes 2 (two) times daily.      Marland Kitchen  metoprolol tartrate (LOPRESSOR) 25 MG tablet Take 2 tablets (50 mg total) by mouth 2 (two) times daily. 90 tablet 6  . Multiple Vitamins-Minerals (CENTRUM) tablet Take 1 tablet by mouth daily.      Marland Kitchen warfarin (COUMADIN) 7.5 MG tablet Take 5-7.5 mg by mouth See admin instructions. Take 7.5mg  tablet every day except on Fridays take 5mg  per patient and family members     No current facility-administered medications for this encounter.     Allergies  Allergen Reactions  . Amiodarone Other (See Comments)    Thyroid toxicity    Social History   Social History  . Marital status: Married    Spouse name: N/A  . Number of children: 3  . Years of education: N/A   Occupational History  . Cabinetmaker     works part time   Social History Main Topics  . Smoking status: Former Smoker    Packs/day: 1.00    Years: 23.00    Quit date: 06/30/1971  . Smokeless tobacco: Never Used  . Alcohol use No  . Drug use: No  . Sexual activity: No   Other Topics Concern  . Not on file   Social History Narrative   Household-- pt and  wife (wife developing dementia)   One son lives in [place], goes there from time to time              Family History  Problem Relation Age of Onset  . Uterine cancer Mother   . Heart attack Maternal Uncle   . Heart disease Maternal Uncle   . Colon cancer Neg Hx   . Prostate cancer Neg Hx   . Diabetes Neg Hx     ROS- All systems are reviewed and negative except as per the HPI above  Physical Exam: Vitals:   05/03/16 1125  BP: 118/62  Pulse: 90  Weight: 244 lb (110.7 kg)  Height: 6\' 3"  (1.905 m)    GEN- The patient is well appearing, alert and oriented x 3 today.   Head- normocephalic, atraumatic Eyes-  Sclera clear, conjunctiva pink Ears- hearing intact Oropharynx- clear Neck- supple, no JVP Lymph- no cervical lymphadenopathy Lungs- Clear to ausculation bilaterally, normal work of breathing Heart- Irregular rate and rhythm, no murmurs, rubs or gallops, PMI not laterally displaced GI- soft, NT, ND, + BS Extremities- no clubbing, cyanosis,  2+ bilateral edema MS- no significant deformity or atrophy Skin- no rash or lesion Psych- euthymic mood, full affect Neuro- strength and sensation are intact  EKG-A fib with v rate 90 bpm,  RBBB, qrs int 129ms, Qtc 513 ms( prolonged due to afib, BBB) Epic records reviewed CT chest w/o contrast- IMPRESSION: Multiple small bilateral pulmonary nodules are identified and cannot be definitively characterized. Non-contrast chest CT at 3-6 months is recommended. If the nodules are stable at time of repeat CT, then future CT at 18-24 months (from today's scan) is considered optional for low-risk patients, but is recommended for high-risk patients. This recommendation follows the consensus statement: Guidelines for Management of Incidental Pulmonary Nodules Detected on CT Images:From the Fleischner Society 2017; published online before print (10.1148/radiol.SG:5268862).  Small bilateral pleural  effusions.  Cardiomegaly.  Calcific aortic and coronary atherosclerosis.  Multiple thoracic spine compression fractures. The T7 compression fracture appears new since 2016 the cannot be definitively Characterized.  Assessment and Plan: 1.Persistent symptomatic  afib secondary to thyroid toxicity  Left hospital in SR after tikosyn load but has returned to afib.  Will schedule for  cardioversion Continue with metoprolol and cardizem Continue warfarin, INR today at 3.0 Will order sleep study for witnessed apnea and daytime somnolence  2. Hyperthyroidism Resolved  Off methimazole  Per Endocrinologist  3.Persisitent left atrial thrombus Resolved with last TEE 8/21, INR's were therapeutic for 4 weeks prior to TEE and since then therapeutic... 8/22- 3.32, 8/23- 2.57,8/24- 2.37 ms, 8/29-3.0, and today 9/12, 3.0. Pending INR in coumdin clinic prior to DCCV 9/18 at 8:45. Will message Dr. Meda Coffee to see if she is comfortable to cardiovert without TEE.  4. Pulmonology nodules Abnormal CXR CT obtained in hospital with recommendation to f/u at 3-6 months for stability of nodules  F/u with [name] as scheduled 10/2  Geroge Baseman. Archie Shea, [facility] [address] [place], [place] [address] [phone]

## 2016-05-09 ENCOUNTER — Other Ambulatory Visit: Payer: Medicare Other

## 2016-05-09 ENCOUNTER — Other Ambulatory Visit: Payer: Self-pay | Admitting: Internal Medicine

## 2016-05-09 ENCOUNTER — Other Ambulatory Visit (HOSPITAL_COMMUNITY): Payer: Self-pay | Admitting: *Deleted

## 2016-05-09 ENCOUNTER — Encounter (HOSPITAL_COMMUNITY): Admission: RE | Payer: Self-pay | Source: Ambulatory Visit

## 2016-05-09 ENCOUNTER — Encounter (HOSPITAL_COMMUNITY): Payer: Self-pay | Admitting: Certified Registered Nurse Anesthetist

## 2016-05-09 ENCOUNTER — Telehealth (HOSPITAL_COMMUNITY): Payer: Self-pay | Admitting: *Deleted

## 2016-05-09 ENCOUNTER — Ambulatory Visit (HOSPITAL_COMMUNITY): Admission: RE | Admit: 2016-05-09 | Payer: Medicare Other | Source: Ambulatory Visit | Admitting: Cardiology

## 2016-05-09 SURGERY — CARDIOVERSION
Anesthesia: Monitor Anesthesia Care

## 2016-05-09 NOTE — CV Procedure (Signed)
Entered in error

## 2016-05-09 NOTE — Anesthesia Preprocedure Evaluation (Deleted)
Anesthesia Evaluation  Patient identified by MRN, date of birth, ID band Patient awake    Reviewed: Allergy & Precautions, NPO status , Patient's Chart, lab work & pertinent test results, reviewed documented beta blocker date and time   Airway        Dental   Pulmonary former smoker,           Cardiovascular hypertension, Pt. on medications and Pt. on home beta blockers + dysrhythmias Atrial Fibrillation      Neuro/Psych negative neurological ROS  negative psych ROS   GI/Hepatic negative GI ROS, Neg liver ROS,   Endo/Other  negative endocrine ROS  Renal/GU negative Renal ROS  negative genitourinary   Musculoskeletal negative musculoskeletal ROS (+)   Abdominal   Peds negative pediatric ROS (+)  Hematology negative hematology ROS (+)   Anesthesia Other Findings   Reproductive/Obstetrics negative OB ROS                             Lab Results  Component Value Date   WBC 6.3 05/03/2016   HGB 15.8 05/03/2016   HCT 47.4 05/03/2016   MCV 93.3 05/03/2016   PLT 210 05/03/2016   Lab Results  Component Value Date   CREATININE 1.02 05/03/2016   BUN 22 (H) 05/03/2016   NA 141 05/03/2016   K 4.3 05/03/2016   CL 107 05/03/2016   CO2 27 05/03/2016   Lab Results  Component Value Date   INR 3.0 05/03/2016   INR 3.0 04/19/2016   INR 2.37 04/14/2016   04/2016 EKG: atrial fibrillation  01/2016 Echo Normal EF.   Anesthesia Physical Anesthesia Plan  ASA: III  Anesthesia Plan: MAC   Post-op Pain Management:    Induction: Intravenous  Airway Management Planned: Natural Airway  Additional Equipment:   Intra-op Plan:   Post-operative Plan:   Informed Consent: I have reviewed the patients History and Physical, chart, labs and discussed the procedure including the risks, benefits and alternatives for the proposed anesthesia with the patient or authorized representative who has  indicated his/her understanding and acceptance.     Plan Discussed with: CRNA  Anesthesia Plan Comments:         Anesthesia Quick Evaluation

## 2016-05-09 NOTE — Telephone Encounter (Signed)
Pt had gotten confused after receiving call from dr Dwyane Dee office regarding lab appt being changed to Tuesday and he thought this was the cardioversion. All instructions for new date/time for cardioversion were reviewed with pt. He will go to coumadin clinic at 930am on 9/21 and go to hospital by 10am for cardioversion. Pt verbalized understanding.

## 2016-05-10 ENCOUNTER — Other Ambulatory Visit: Payer: Medicare Other

## 2016-05-12 ENCOUNTER — Other Ambulatory Visit: Payer: Self-pay

## 2016-05-12 ENCOUNTER — Ambulatory Visit: Payer: Medicare Other | Admitting: Endocrinology

## 2016-05-12 ENCOUNTER — Ambulatory Visit (HOSPITAL_COMMUNITY)
Admission: RE | Admit: 2016-05-12 | Discharge: 2016-05-12 | Disposition: A | Discharge status: Home / Self Care | Payer: Medicare Other | Source: Ambulatory Visit | Attending: Internal Medicine | Admitting: Internal Medicine

## 2016-05-12 ENCOUNTER — Encounter (HOSPITAL_COMMUNITY)
Admission: RE | Disposition: A | Discharge status: Home / Self Care | Payer: Self-pay | Source: Ambulatory Visit | Attending: Internal Medicine

## 2016-05-12 ENCOUNTER — Ambulatory Visit (INDEPENDENT_AMBULATORY_CARE_PROVIDER_SITE_OTHER): Payer: Medicare Other | Admitting: *Deleted

## 2016-05-12 ENCOUNTER — Encounter (HOSPITAL_COMMUNITY): Payer: Self-pay | Admitting: *Deleted

## 2016-05-12 ENCOUNTER — Encounter (HOSPITAL_COMMUNITY): Payer: Self-pay | Admitting: Anesthesiology

## 2016-05-12 DIAGNOSIS — I481 Persistent atrial fibrillation: Secondary | ICD-10-CM

## 2016-05-12 DIAGNOSIS — I482 Chronic atrial fibrillation, unspecified: Secondary | ICD-10-CM

## 2016-05-12 DIAGNOSIS — I4891 Unspecified atrial fibrillation: Secondary | ICD-10-CM | POA: Diagnosis not present

## 2016-05-12 DIAGNOSIS — Z5181 Encounter for therapeutic drug level monitoring: Secondary | ICD-10-CM | POA: Diagnosis not present

## 2016-05-12 DIAGNOSIS — Z538 Procedure and treatment not carried out for other reasons: Secondary | ICD-10-CM | POA: Insufficient documentation

## 2016-05-12 DIAGNOSIS — I4821 Permanent atrial fibrillation: Secondary | ICD-10-CM

## 2016-05-12 DIAGNOSIS — I4819 Other persistent atrial fibrillation: Secondary | ICD-10-CM

## 2016-05-12 LAB — POCT INR: INR: 3.1

## 2016-05-12 SURGERY — CANCELLED PROCEDURE
Anesthesia: Monitor Anesthesia Care

## 2016-05-12 NOTE — Op Note (Signed)
Procedure cancelled  Pt in SR on arrival to endoscopy suite.

## 2016-05-12 NOTE — Progress Notes (Signed)
Patient came in today for outpatient cardioversion. Patient presents in a sinus bradycardia and Dr. Harrington Challenger was notified. Procedure is cancelled today and is to keep follow-up with [name].

## 2016-05-12 NOTE — Anesthesia Preprocedure Evaluation (Deleted)
Anesthesia Evaluation  Patient identified by MRN, date of birth, ID band Patient awake    Reviewed: Allergy & Precautions, NPO status , Patient's Chart, lab work & pertinent test results, reviewed documented beta blocker date and time   Airway        Dental   Pulmonary former smoker,           Cardiovascular hypertension, Pt. on medications and Pt. on home beta blockers + dysrhythmias Atrial Fibrillation      Neuro/Psych negative neurological ROS  negative psych ROS   GI/Hepatic negative GI ROS, Neg liver ROS,   Endo/Other  negative endocrine ROS  Renal/GU negative Renal ROS  negative genitourinary   Musculoskeletal negative musculoskeletal ROS (+)   Abdominal   Peds negative pediatric ROS (+)  Hematology negative hematology ROS (+)   Anesthesia Other Findings   Reproductive/Obstetrics negative OB ROS                             Lab Results  Component Value Date   WBC 6.3 05/03/2016   HGB 15.8 05/03/2016   HCT 47.4 05/03/2016   MCV 93.3 05/03/2016   PLT 210 05/03/2016   Lab Results  Component Value Date   CREATININE 1.02 05/03/2016   BUN 22 (H) 05/03/2016   NA 141 05/03/2016   K 4.3 05/03/2016   CL 107 05/03/2016   CO2 27 05/03/2016   Lab Results  Component Value Date   INR 3.0 05/03/2016   INR 3.0 04/19/2016   INR 2.37 04/14/2016   04/2016 EKG: atrial fibrillation  01/2016 Echo Normal EF.   Anesthesia Physical  Anesthesia Plan  ASA: III  Anesthesia Plan: MAC   Post-op Pain Management:    Induction: Intravenous  Airway Management Planned: Natural Airway  Additional Equipment:   Intra-op Plan:   Post-operative Plan:   Informed Consent: I have reviewed the patients History and Physical, chart, labs and discussed the procedure including the risks, benefits and alternatives for the proposed anesthesia with the patient or authorized representative who has  indicated his/her understanding and acceptance.     Plan Discussed with: CRNA  Anesthesia Plan Comments:         Anesthesia Quick Evaluation

## 2016-05-13 ENCOUNTER — Telehealth: Payer: Self-pay | Admitting: *Deleted

## 2016-05-13 ENCOUNTER — Encounter: Payer: Self-pay | Admitting: Internal Medicine

## 2016-05-13 NOTE — Telephone Encounter (Signed)
Pt called to inform [facility] that his DCCV was canceled due him being in [place] Sinus Rhythm. Changed pt's appt to the following week per pt request.  Advised to call back with any issues or questions.

## 2016-05-23 ENCOUNTER — Encounter: Payer: Medicare Other | Admitting: Internal Medicine

## 2016-05-26 ENCOUNTER — Other Ambulatory Visit (INDEPENDENT_AMBULATORY_CARE_PROVIDER_SITE_OTHER): Payer: Medicare Other

## 2016-05-26 ENCOUNTER — Ambulatory Visit (INDEPENDENT_AMBULATORY_CARE_PROVIDER_SITE_OTHER): Payer: Medicare Other | Admitting: *Deleted

## 2016-05-26 DIAGNOSIS — Z5181 Encounter for therapeutic drug level monitoring: Secondary | ICD-10-CM | POA: Diagnosis not present

## 2016-05-26 DIAGNOSIS — E0580 Other thyrotoxicosis without thyrotoxic crisis or storm: Secondary | ICD-10-CM

## 2016-05-26 DIAGNOSIS — I482 Chronic atrial fibrillation, unspecified: Secondary | ICD-10-CM

## 2016-05-26 DIAGNOSIS — I4821 Permanent atrial fibrillation: Secondary | ICD-10-CM

## 2016-05-26 DIAGNOSIS — I481 Persistent atrial fibrillation: Secondary | ICD-10-CM | POA: Diagnosis not present

## 2016-05-26 DIAGNOSIS — I4819 Other persistent atrial fibrillation: Secondary | ICD-10-CM

## 2016-05-26 DIAGNOSIS — T462X5A Adverse effect of other antidysrhythmic drugs, initial encounter: Secondary | ICD-10-CM

## 2016-05-26 LAB — POCT INR: INR: 2.2

## 2016-05-26 LAB — TSH: TSH: 3.07 u[IU]/mL (ref 0.35–4.50)

## 2016-05-26 LAB — T4, FREE: FREE T4: 0.64 ng/dL (ref 0.60–1.60)

## 2016-05-26 LAB — T3, FREE: T3 FREE: 2.9 pg/mL (ref 2.3–4.2)

## 2016-05-31 ENCOUNTER — Ambulatory Visit (INDEPENDENT_AMBULATORY_CARE_PROVIDER_SITE_OTHER): Payer: Medicare Other | Admitting: Internal Medicine

## 2016-05-31 ENCOUNTER — Ambulatory Visit (INDEPENDENT_AMBULATORY_CARE_PROVIDER_SITE_OTHER): Payer: Medicare Other | Admitting: Endocrinology

## 2016-05-31 ENCOUNTER — Encounter: Payer: Self-pay | Admitting: Internal Medicine

## 2016-05-31 VITALS — BP 116/64 | HR 67 | Temp 98.0°F | Resp 16 | Ht 75.0 in | Wt 253.0 lb

## 2016-05-31 VITALS — BP 112/76 | HR 76 | Ht 75.0 in | Wt 252.8 lb

## 2016-05-31 DIAGNOSIS — E0580 Other thyrotoxicosis without thyrotoxic crisis or storm: Secondary | ICD-10-CM | POA: Diagnosis not present

## 2016-05-31 DIAGNOSIS — Z79899 Other long term (current) drug therapy: Secondary | ICD-10-CM | POA: Diagnosis not present

## 2016-05-31 DIAGNOSIS — I480 Paroxysmal atrial fibrillation: Secondary | ICD-10-CM

## 2016-05-31 DIAGNOSIS — T462X5A Adverse effect of other antidysrhythmic drugs, initial encounter: Secondary | ICD-10-CM

## 2016-05-31 MED ORDER — FUROSEMIDE 20 MG PO TABS: ORAL_TABLET | ORAL | 2 refills | Status: AC

## 2016-05-31 NOTE — Patient Instructions (Signed)
Medication Instructions: - Your physician has recommended you make the following change in your medication:  1) take lasix (furosemide) 20 mg once daily x 3 days, then one tablet daily as needed for swelling/ shortness of breath  Labwork: -  None ordered  Procedures/Testing: - none orderd  Follow-Up: - Your physician recommends that you schedule a follow-up appointment in: 3 months with [name], NP in the a-fib clinic  - Your physician wants you to follow-up in: 1 year with [name]. You will receive a reminder letter in the mail two months in advance. If you don't receive a letter, please call our office to schedule the follow-up appointment.  Any Additional Special Instructions Will Be Listed Below (If Applicable).     If you need a refill on your cardiac medications before your next appointment, please call your pharmacy.

## 2016-05-31 NOTE — Progress Notes (Signed)
Patient ID: Antonio [name], male   DOB: [birth year], 82 y.o.   MRN: [id number]                                                                                                               Reason for Appointment:  Hyperthyroidism, follow-up  Referring physician: Larose Kells   History of Present Illness:   He was seen in 3/17 with the following clinical picture: For 3 months the patient  had symptoms of decreased appetite, progressive weight loss, tiredness and weakness No usual complaints of any palpitations, shakiness, feeling excessively warm and sweaty or any nervousness He had been on amiodarone since his atrial fibrillation was diagnosed about 7 years ago   He was started on methimazole 10 mg twice a day in 3/17  After starting treatment he had no problems with weight loss or decreased appetite His methimazole dose was reduced to one tablet daily from twice a day in July and was stopped in 03/2016 TSH had started increasing  Recently has normal energy level No cold intolerance Has  increase in weight  His TSH is now back to normal with low normal free T4    Wt Readings from Last 3 Encounters:  05/31/16 253 lb (114.8 kg)  05/31/16 252 lb 12.8 oz (114.7 kg)  05/03/16 244 lb (110.7 kg)    Lab Results  Component Value Date   FREET4 0.64 05/26/2016   FREET4 0.72 04/01/2016   FREET4 0.63 02/29/2016   T3FREE 2.9 05/26/2016   T3FREE 2.6 02/29/2016   T3FREE 3.2 01/27/2016   TSH 3.07 05/26/2016   TSH 11.31 (H) 04/01/2016   TSH 6.51 (H) 02/29/2016        Medication List       Accurate as of 05/31/16  3:14 PM. Always use your most recent med list.          CENTRUM tablet Take 1 tablet by mouth daily.   diltiazem 120 MG 24 hr capsule Commonly known as:  CARDIZEM CD Take 1 capsule (120 mg total) by mouth daily.   dofetilide 250 MCG capsule Commonly known as:  TIKOSYN Take 250 mcg by mouth 2 (two) times daily.   dofetilide 125 MCG capsule Commonly known as:   TIKOSYN Take 125 mcg by mouth 3 (three) times daily.   dorzolamide 2 % ophthalmic solution Commonly known as:  TRUSOPT Place 1 drop into both eyes 2 (two) times daily.   furosemide 20 MG tablet Commonly known as:  LASIX Take one tablet (20 mg) by mouth once daily as needed for swelling/ shortness of breath   metoprolol tartrate 25 MG tablet Commonly known as:  LOPRESSOR Take 2 tablets (50 mg total) by mouth 2 (two) times daily.   warfarin 5 MG tablet Commonly known as:  COUMADIN TAKE AS DIRECTED BY COUMADIN CLINIC           Past Medical History:  Diagnosis Date  . Anemia   . Appendicitis    SBP 7/09 ruputre w/ RLQ abscess 03/16/08  .  Atrial fibrillation ([place])   . Basal cell carcinoma of nose 2016   Right side nasal tip, [name]  . BPH (benign prostatic hyperplasia) 06/29/2011  . Cardiomyopathy    secondary to Tachycardia. A-Acute on chronic admit for CHF 6/08. B- CHF coupled with atrial fib RVR electrophysiology study 09/27/07.  A- Multiple flutters and Atrial fib- no ablation performed b- ibutilide cardoversion c- amiordarone load- d/c in sinus rhyrthm d- coumadin anticoagulation- stopped at time of GI bleed 11/09  . Glaucoma   . History of blood transfusion 2009   "related to rupture in esophagus" (04/13/2016)  . Hyperlipidemia   . Hypertension   . Knee fracture, right 08/2014   "no OR" (04/13/2016)  . Lung nodules    Antonio [name] 04/13/2016  . Mallory - Weiss tear    esophagitis, 6 unit hemorrhage- EPI injections   . Small bowel obstruction     Past Surgical History:  Procedure Laterality Date  . APPENDECTOMY  02/2008   exploratory, ruptured appendix, RLQ abscess  . BASAL CELL CARCINOMA EXCISION     nose  . CATARACT EXTRACTION W/ INTRAOCULAR LENS  IMPLANT, BILATERAL Bilateral   . COLONOSCOPY    . COLONOSCOPY W/ BIOPSIES AND POLYPECTOMY  ~ 2015  . EP study  09/27/07   see pmh  . EXPLORATORY LAPAROTOMY  02/2008  . HEMORRHOID SURGERY    . TEE WITHOUT CARDIOVERSION N/A  02/08/2016   Procedure: TRANSESOPHAGEAL ECHOCARDIOGRAM (TEE);  Surgeon: Fay Records, MD;  Location: [facility] ENDOSCOPY;  Service: Cardiovascular;  Laterality: N/A;  . TEE WITHOUT CARDIOVERSION N/A 04/11/2016   Procedure: TRANSESOPHAGEAL ECHOCARDIOGRAM (TEE);  Surgeon: Josue Hector, MD;  Location: [facility] ENDOSCOPY;  Service: Cardiovascular;  Laterality: N/A;  . TEE/DCCV  02/07/07    Family History  Problem Relation Age of Onset  . Uterine cancer Mother   . Heart attack Maternal Uncle   . Heart disease Maternal Uncle   . Colon cancer Neg Hx   . Prostate cancer Neg Hx   . Diabetes Neg Hx     Social History:  reports that he quit smoking about 44 years ago. He has a 23.00 pack-year smoking history. He has never used smokeless tobacco. He reports that he does not drink alcohol or use drugs.  Allergies:  Allergies  Allergen Reactions  . Amiodarone Other (See Comments)    Thyroid toxicity     Review of Systems    Examination:   BP 116/64   Pulse 67   Temp 98 F (36.7 C)   Resp 16   Ht 6\' 3"  (1.905 m)   Wt 253 lb (114.8 kg)   SpO2 97%   BMI 31.62 kg/m   Thyroid not clearly palpable today Deep tendon reflexes at biceps are normal but difficult to elicit Skin appears normal   Assessment/Plan:   Hyperthyroidism, from amiodarone along with underlying small multinodular goiter, resolved        He is likely to remain euthyroid now with no residual effect of amiodarone However may need to periodically monitored for hypothyroidism also  He will follow-up with PCP every 6 months  [name] 05/31/2016, 3:14 PM

## 2016-05-31 NOTE — Progress Notes (Signed)
Patient Care Team: Colon Branch, MD as PCP - General Milus Banister, MD as Attending Physician (Gastroenterology) Deboraha Sprang, MD as Consulting Physician (Cardiology) Clent Jacks, MD as Consulting Physician (Ophthalmology) Elayne Snare, MD as Consulting Physician (Endocrinology)   HPI  Antonio [name] is a 82 y.o. male seen in followup for paroxysmal atrial fibrillation associated with a tachycardia-induced cardiomyopathy which has now normalized. He took amiodarone.   this was discontinued a few months ago after he was found to be hyperthyroid following presentation with syncope in [place].  We had discussed anticoagulation on  number of occasions.   He finally ended up on apixoban.  LVEF was 55-60%.  With recurring symptoms of shortness of breath and fatigue  he was admitted finally for dofetilide loading. Because of problems with clots this repeatedly deferred and ultimately he was switched to warfarin.   He has been in and out of rhythm on his dofetilide but mostly in rhythm. And feels better.  Blood work is being followed by [name]      He has some fatigue and somnolence  He says he doesn't snore, but his wife has moved upstairs and he naps A sleep study is now scheduled   Echocardiogram 12/10 demonstrated normal LV function with moderate biatrial enlargement--LAE (51/2.1)  TEE 8/17 EF 55-60%  Records and Results Reviewed As above   Past Medical History:  Diagnosis Date  . Anemia   . Appendicitis    SBP 7/09 ruputre w/ RLQ abscess 03/16/08  . Atrial fibrillation ([place])   . Basal cell carcinoma of nose 2016   Right side nasal tip, [name]  . BPH (benign prostatic hyperplasia) 06/29/2011  . Cardiomyopathy    secondary to Tachycardia. A-Acute on chronic admit for CHF 6/08. B- CHF coupled with atrial fib RVR electrophysiology study 09/27/07.  A- Multiple flutters and Atrial fib- no ablation performed b- ibutilide cardoversion c- amiordarone load- d/c in  sinus rhyrthm d- coumadin anticoagulation- stopped at time of GI bleed 11/09  . Glaucoma   . History of blood transfusion 2009   "related to rupture in esophagus" (04/13/2016)  . Hyperlipidemia   . Hypertension   . Knee fracture, right 08/2014   "no OR" (04/13/2016)  . Lung nodules    [name] 04/13/2016  . Mallory - Weiss tear    esophagitis, 6 unit hemorrhage- EPI injections   . Small bowel obstruction     Past Surgical History:  Procedure Laterality Date  . APPENDECTOMY  02/2008   exploratory, ruptured appendix, RLQ abscess  . BASAL CELL CARCINOMA EXCISION     nose  . CATARACT EXTRACTION W/ INTRAOCULAR LENS  IMPLANT, BILATERAL Bilateral   . COLONOSCOPY    . COLONOSCOPY W/ BIOPSIES AND POLYPECTOMY  ~ 2015  . EP study  09/27/07   see pmh  . EXPLORATORY LAPAROTOMY  02/2008  . HEMORRHOID SURGERY    . TEE WITHOUT CARDIOVERSION N/A 02/08/2016   Procedure: TRANSESOPHAGEAL ECHOCARDIOGRAM (TEE);  Surgeon: Fay Records, MD;  Location: [facility] ENDOSCOPY;  Service: Cardiovascular;  Laterality: N/A;  . TEE WITHOUT CARDIOVERSION N/A 04/11/2016   Procedure: TRANSESOPHAGEAL ECHOCARDIOGRAM (TEE);  Surgeon: Josue Hector, MD;  Location[facility] ENDOSCOPY;  Service: Cardiovascular;  Laterality: N/A;  . TEE/DCCV  02/07/07    Current Outpatient Prescriptions  Medication Sig Dispense Refill  . diltiazem (CARDIZEM CD) 120 MG 24 hr capsule Take 1 capsule (120 mg total) by mouth daily. 90 capsule 3  .  dofetilide (TIKOSYN) 125 MCG capsule Take 125 mcg by mouth 2 (two) times daily.    Marland Kitchen dofetilide (TIKOSYN) 250 MCG capsule Take 250 mcg by mouth 2 (two) times daily.    . dorzolamide (TRUSOPT) 2 % ophthalmic solution Place 1 drop into both eyes 2 (two) times daily.      . metoprolol tartrate (LOPRESSOR) 25 MG tablet Take 2 tablets (50 mg total) by mouth 2 (two) times daily. 90 tablet 6  . Multiple Vitamins-Minerals (CENTRUM) tablet Take 1 tablet by mouth daily.      Marland Kitchen warfarin (COUMADIN) 5 MG tablet TAKE AS DIRECTED  BY COUMADIN CLINIC 45 tablet 3   No current facility-administered medications for this visit.     Allergies  Allergen Reactions  . Amiodarone Other (See Comments)    Thyroid toxicity      Review of Systems negative except from HPI and PMH  Physical Exam BP 112/76   Pulse 76   Ht 6\' 3"  (1.905 m)   Wt 252 lb 12.8 oz (114.7 kg)   SpO2 98%   BMI 31.60 kg/m  Well developed and well nourished in no acute distress HENT normal E scleral and icterus clear Neck Supple JVP flat; carotids brisk and full Clear to ausculation Regular rate and rhythm, no murmurs gallops or rub Soft with active bowel sounds No clubbing cyanosis  2 + Edema Alert and oriented, grossly normal motor and sensory function Skin Warm and Dry  ECG demonstrates sinus rhythm at 64 Intervals 20/15/49  Assessment   Atrial fibrillation-persistent  Dofetilide  High Risk Medication Surveillance  Resolved cardiomyopathy  Hypertension   HFpEF-new  he is volume overloaded. The edema  could be related to Cardizem; more likely is related to salt and water. We will begin him on a low-dose diuretic for 3 days-furosemide 20 mg. We have discussed the interaction salt and water and edema.  We'll continue him on dofetilide at his current dose not withstanding the paroxysms of atrial fibrillation.  Blood work to be drawn today by his PCP/endocrinology    BP well contorllled          Current medicines are reviewed at length with the patient today .  The patient does not  have concerns regarding medicines.

## 2016-06-15 ENCOUNTER — Ambulatory Visit (INDEPENDENT_AMBULATORY_CARE_PROVIDER_SITE_OTHER): Payer: Medicare Other | Admitting: Pharmacist

## 2016-06-15 DIAGNOSIS — Z5181 Encounter for therapeutic drug level monitoring: Secondary | ICD-10-CM

## 2016-06-15 DIAGNOSIS — I482 Chronic atrial fibrillation, unspecified: Secondary | ICD-10-CM

## 2016-06-15 DIAGNOSIS — I4821 Permanent atrial fibrillation: Secondary | ICD-10-CM

## 2016-06-15 LAB — BASIC METABOLIC PANEL
BUN: 13 mg/dL (ref 7–25)
CALCIUM: 9.3 mg/dL (ref 8.6–10.3)
CO2: 28 mmol/L (ref 20–31)
CREATININE: 1.02 mg/dL (ref 0.70–1.11)
Chloride: 104 mmol/L (ref 98–110)
GLUCOSE: 82 mg/dL (ref 65–99)
Potassium: 3.9 mmol/L (ref 3.5–5.3)
SODIUM: 140 mmol/L (ref 135–146)

## 2016-06-15 LAB — POCT INR: INR: 3.6

## 2016-06-15 LAB — MAGNESIUM: MAGNESIUM: 1.9 mg/dL (ref 1.5–2.5)

## 2016-06-16 ENCOUNTER — Telehealth: Payer: Self-pay | Admitting: Internal Medicine

## 2016-06-16 ENCOUNTER — Ambulatory Visit (HOSPITAL_BASED_OUTPATIENT_CLINIC_OR_DEPARTMENT_OTHER): Payer: Medicare Other | Attending: Nurse Practitioner | Admitting: Cardiology

## 2016-06-16 DIAGNOSIS — I481 Persistent atrial fibrillation: Secondary | ICD-10-CM | POA: Diagnosis not present

## 2016-06-16 DIAGNOSIS — G4733 Obstructive sleep apnea (adult) (pediatric): Secondary | ICD-10-CM | POA: Insufficient documentation

## 2016-06-16 DIAGNOSIS — G4761 Periodic limb movement disorder: Secondary | ICD-10-CM | POA: Diagnosis not present

## 2016-06-16 DIAGNOSIS — R0683 Snoring: Secondary | ICD-10-CM | POA: Insufficient documentation

## 2016-06-16 DIAGNOSIS — Z79899 Other long term (current) drug therapy: Secondary | ICD-10-CM | POA: Insufficient documentation

## 2016-06-16 DIAGNOSIS — I4819 Other persistent atrial fibrillation: Secondary | ICD-10-CM

## 2016-06-16 NOTE — Telephone Encounter (Signed)
Pt's dtr [facility] calling regarding missed call this am, she thought Jinny Blossom may have called regarding results from labs she ordered yesterday-pls call [phone]

## 2016-06-16 NOTE — Telephone Encounter (Signed)
Returned patient's daughter's call to discuss lab results. Advised her that Mg is at goal and BMET was stable. K slightly low at 3.9 since pt is on Tikosyn, goal is 4 or above. Advised pt to increase potassium in his diet, they verbalized understanding.

## 2016-06-17 NOTE — Progress Notes (Signed)
Patient informed of information.   Encouraged him to see PCP for the leg movements.  Stated verbal understanding and thanked me for the call.

## 2016-06-17 NOTE — Procedures (Signed)
   Patient Name: Antonio [name], Antonio [name] Date: 06/16/2016 Gender: Male D.O.B: Apr 13, 1929 Age (years): 86 Referring Provider: Sherran Needs Height (inches): 6 Interpreting Physician: Fransico Him MD, ABSM Weight (lbs): 250 RPSGT: Zadie Rhine BMI: 31 MRN: [id number] Neck Size: 18.00  CLINICAL INFORMATION Sleep Study Type: NPSG Indication for sleep study: OSA Epworth Sleepiness Score: 14  SLEEP STUDY TECHNIQUE As per the AASM Manual for the Scoring of Sleep and Associated Events v2.3 (April 2016) with a hypopnea requiring 4% desaturations. The channels recorded and monitored were frontal, central and occipital EEG, electrooculogram (EOG), submentalis EMG (chin), nasal and oral airflow, thoracic and abdominal wall motion, anterior tibialis EMG, snore microphone, electrocardiogram, and pulse oximetry.  MEDICATIONS Medications self-administered by patient taken the night of the study : CARDIZEM CD, TIKOSYN, CENTRUM  SLEEP ARCHITECTURE The study was initiated at 10:06:54 PM and ended at 4:09:03 AM. Sleep onset time was 53.8 minutes and the sleep efficiency was 66.3%. The total sleep time was 240.0 minutes. Stage REM latency was 212.0 minutes. The patient spent 6.46% of the night in stage N1 sleep, 80.21% in stage N2 sleep, 8.13% in stage N3 and 5.21% in REM. Alpha intrusion was absent. Supine sleep was 0.00%.  RESPIRATORY PARAMETERS The overall apnea/hypopnea index (AHI) was 0.5 per hour. There were 2 total apneas, including 2 obstructive, 0 central and 0 mixed apneas. There were 0 hypopneas and 0 RERAs. The AHI during Stage REM sleep was 0.0 per hour. AHI while supine was N/A per hour. The mean oxygen saturation was 92.30%. The minimum SpO2 during sleep was 89.00%. Soft snoring was noted during this study.  CARDIAC DATA The 2 lead EKG demonstrated sinus rhythm. The mean heart rate was 65.81 beats per minute. Other EKG findings include: None.  LEG MOVEMENT DATA The total PLMS  were 1035 with a resulting PLMS index of 258.75. Associated arousal with leg movement index was 6.3 .  IMPRESSIONS - No significant obstructive sleep apnea occurred during this study (AHI = 0.5/h). - No significant central sleep apnea occurred during this study (CAI = 0.0/h). - The patient had minimal or no oxygen desaturation during the study (Min O2 = 89.00%) - The patient snored with Soft snoring volume. - No cardiac abnormalities were noted during this study. - Severe periodic limb movements of sleep occurred during the study. Associated arousals were significant.  DIAGNOSIS - Periodic Limb Movement Disorder  RECOMMENDATIONS - Avoid alcohol, sedatives and other CNS depressants that may worsen sleep apnea and disrupt normal sleep architecture. - Sleep hygiene should be reviewed to assess factors that may improve sleep quality. - Weight management and regular exercise should be initiated or continued if appropriate. - The patient should be screened for symptoms of restless legs.   [place], American Board of Sleep Medicine  ELECTRONICALLY SIGNED ON:  06/17/2016, 2:42 PM [place] PH: [phone]   FX: [phone] [place]

## 2016-06-21 ENCOUNTER — Telehealth: Payer: Self-pay

## 2016-06-21 NOTE — Telephone Encounter (Signed)
Pt is aware and agreeable to normal results. Pt was elated to receive more good news as he states he got his sleep study results and they were good as well. He states "I'll take all I can get".

## 2016-07-08 ENCOUNTER — Telehealth: Payer: Self-pay

## 2016-07-08 ENCOUNTER — Ambulatory Visit (HOSPITAL_BASED_OUTPATIENT_CLINIC_OR_DEPARTMENT_OTHER)
Admission: RE | Admit: 2016-07-08 | Discharge: 2016-07-08 | Disposition: A | Discharge status: Home / Self Care | Payer: Medicare Other | Source: Ambulatory Visit | Attending: Internal Medicine | Admitting: Internal Medicine

## 2016-07-08 ENCOUNTER — Encounter: Payer: Self-pay | Admitting: Internal Medicine

## 2016-07-08 ENCOUNTER — Ambulatory Visit (INDEPENDENT_AMBULATORY_CARE_PROVIDER_SITE_OTHER): Payer: Medicare Other | Admitting: Internal Medicine

## 2016-07-08 VITALS — BP 130/78 | HR 62 | Temp 97.9°F | Resp 14 | Ht 75.0 in | Wt 252.0 lb

## 2016-07-08 DIAGNOSIS — M5030 Other cervical disc degeneration, unspecified cervical region: Secondary | ICD-10-CM | POA: Diagnosis not present

## 2016-07-08 DIAGNOSIS — M1712 Unilateral primary osteoarthritis, left knee: Secondary | ICD-10-CM | POA: Insufficient documentation

## 2016-07-08 DIAGNOSIS — L989 Disorder of the skin and subcutaneous tissue, unspecified: Secondary | ICD-10-CM

## 2016-07-08 DIAGNOSIS — M25561 Pain in right knee: Secondary | ICD-10-CM | POA: Insufficient documentation

## 2016-07-08 DIAGNOSIS — Z8781 Personal history of (healed) traumatic fracture: Secondary | ICD-10-CM

## 2016-07-08 DIAGNOSIS — R938 Abnormal findings on diagnostic imaging of other specified body structures: Secondary | ICD-10-CM

## 2016-07-08 DIAGNOSIS — I7090 Unspecified atherosclerosis: Secondary | ICD-10-CM | POA: Diagnosis not present

## 2016-07-08 DIAGNOSIS — M542 Cervicalgia: Secondary | ICD-10-CM | POA: Diagnosis not present

## 2016-07-08 DIAGNOSIS — M25462 Effusion, left knee: Secondary | ICD-10-CM | POA: Diagnosis not present

## 2016-07-08 DIAGNOSIS — Z0000 Encounter for general adult medical examination without abnormal findings: Secondary | ICD-10-CM

## 2016-07-08 DIAGNOSIS — Z23 Encounter for immunization: Secondary | ICD-10-CM | POA: Diagnosis not present

## 2016-07-08 DIAGNOSIS — N5089 Other specified disorders of the male genital organs: Secondary | ICD-10-CM

## 2016-07-08 DIAGNOSIS — R9389 Abnormal findings on diagnostic imaging of other specified body structures: Secondary | ICD-10-CM

## 2016-07-08 NOTE — Progress Notes (Signed)
Pre visit review using our clinic review tool, if applicable. No additional management support is needed unless otherwise documented below in the visit note. 

## 2016-07-08 NOTE — Telephone Encounter (Signed)
Tikosyn 184mcg ordered through [employer]. Order/conf # [id number]. And should arrive in 7-10 days.

## 2016-07-08 NOTE — Patient Instructions (Signed)
For pain at the knee:  ICE and Tylenol as needed. We are referring to do sports medicine  Do your x-rays on the first floors today  Next visit in 6 months     Fall Prevention and Home Safety Falls cause injuries and can affect all age groups. It is possible to use preventive measures to significantly decrease the likelihood of falls. There are many simple measures which can make your home safer and prevent falls. OUTDOORS  Repair cracks and edges of walkways and driveways.  Remove high doorway thresholds.  Trim shrubbery on the main path into your home.  Have good outside lighting.  Clear walkways of tools, rocks, debris, and clutter.  Check that handrails are not broken and are securely fastened. Both sides of steps should have handrails.  Have leaves, snow, and ice cleared regularly.  Use sand or salt on walkways during winter months.  In the garage, clean up grease or oil spills. BATHROOM  Install night lights.  Install grab bars by the toilet and in the tub and shower.  Use non-skid mats or decals in the tub or shower.  Place a plastic non-slip stool in the shower to sit on, if needed.  Keep floors dry and clean up all water on the floor immediately.  Remove soap buildup in the tub or shower on a regular basis.  Secure bath mats with non-slip, double-sided rug tape.  Remove throw rugs and tripping hazards from the floors. BEDROOMS  Install night lights.  Make sure a bedside light is easy to reach.  Do not use oversized bedding.  Keep a telephone by your bedside.  Have a firm chair with side arms to use for getting dressed.  Remove throw rugs and tripping hazards from the floor. KITCHEN  Keep handles on pots and pans turned toward the center of the stove. Use back burners when possible.  Clean up spills quickly and allow time for drying.  Avoid walking on wet floors.  Avoid hot utensils and knives.  Position shelves so they are not too high  or low.  Place commonly used objects within easy reach.  If necessary, use a sturdy step stool with a grab bar when reaching.  Keep electrical cables out of the way.  Do not use floor polish or wax that makes floors slippery. If you must use wax, use non-skid floor wax.  Remove throw rugs and tripping hazards from the floor. STAIRWAYS  Never leave objects on stairs.  Place handrails on both sides of stairways and use them. Fix any loose handrails. Make sure handrails on both sides of the stairways are as long as the stairs.  Check carpeting to make sure it is firmly attached along stairs. Make repairs to worn or loose carpet promptly.  Avoid placing throw rugs at the top or bottom of stairways, or properly secure the rug with carpet tape to prevent slippage. Get rid of throw rugs, if possible.  Have an electrician put in a light switch at the top and bottom of the stairs. OTHER FALL PREVENTION TIPS  Wear low-heel or rubber-soled shoes that are supportive and fit well. Wear closed toe shoes.  When using a stepladder, make sure it is fully opened and both spreaders are firmly locked. Do not climb a closed stepladder.  Add color or contrast paint or tape to grab bars and handrails in your home. Place contrasting color strips on first and last steps.  Learn and use mobility aids as needed. Install  an electrical emergency response system.  Turn on lights to avoid dark areas. Replace light bulbs that burn out immediately. Get light switches that glow.  Arrange furniture to create clear pathways. Keep furniture in the same place.  Firmly attach carpet with non-skid or double-sided tape.  Eliminate uneven floor surfaces.  Select a carpet pattern that does not visually hide the edge of steps.  Be aware of all pets. OTHER HOME SAFETY TIPS  Set the water temperature for 120 F (48.8 C).  Keep emergency numbers on or near the telephone.  Keep smoke detectors on every level of  the home and near sleeping areas. Document Released: 07/29/2002 Document Revised: 02/07/2012 Document Reviewed: 10/28/2011 [facility] Patient Information 2015 [place], [place]. This information is not intended to replace advice given to you by your health care provider. Make sure you discuss any questions you have with your health care provider.   Preventive Care for Adults Ages 66 and over  Blood pressure check.** / Every 1 to 2 years.  Lipid and cholesterol check.**/ Every 5 years beginning at age 67.  Lung cancer screening. / Every year if you are aged 64-80 years and have a 30-pack-year history of smoking and currently smoke or have quit within the past 15 years. Yearly screening is stopped once you have quit smoking for at least 15 years or develop a health problem that would prevent you from having lung cancer treatment.  Fecal occult blood test (FOBT) of stool. / Every year beginning at age 74 and continuing until age 28. You may not have to do this test if you get a colonoscopy every 10 years.  Flexible sigmoidoscopy** or colonoscopy.** / Every 5 years for a flexible sigmoidoscopy or every 10 years for a colonoscopy beginning at age 20 and continuing until age 14.  Hepatitis C blood test.** / For all people born from [birth year] through 1963 and any individual with known risks for hepatitis C.  Abdominal aortic aneurysm (AAA) screening.** / A one-time screening for ages 86 to 39 years who are current or former smokers.  Skin self-exam. / Monthly.  Influenza vaccine. / Every year.  Tetanus, diphtheria, and acellular pertussis (Tdap/Td) vaccine.** / 1 dose of Td every 10 years.  Varicella vaccine.** / Consult your health care provider.  Zoster vaccine.** / 1 dose for adults aged 52 years or older.  Pneumococcal 13-valent conjugate (PCV13) vaccine.** / Consult your health care provider.  Pneumococcal polysaccharide (PPSV23) vaccine.** / 1 dose for all adults aged 54 years and  older.  Meningococcal vaccine.** / Consult your health care provider.  Hepatitis A vaccine.** / Consult your health care provider.  Hepatitis B vaccine.** / Consult your health care provider.  Haemophilus influenzae type b (Hib) vaccine.** / Consult your health care provider. **Family history and personal history of risk and conditions may change your health care provider's recommendations. Document Released: 10/04/2001 Document Revised: 08/13/2013 Document Reviewed: 01/03/2011 [facility] Patient Information 2015 [place], [place]. This information is not intended to replace advice given to you by your health care provider. Make sure you discuss any questions you have with your health care provider.

## 2016-07-08 NOTE — Progress Notes (Signed)
Subjective:    Patient ID: Antonio [name], male    DOB: Aug 02, 1929, 82 y.o.   MRN: [id number]  DOS:  07/08/2016 Type of visit - description :    Here for Medicare AWV: 1. Risk factors based on Past M, S, F history: reviewed 2. Physical Activities: less active than last year   3. Depression/mood: neg screening 4. Hearing: uses hearing aids; some tinnitus in the L x years, sx stable 5. ADL's: totally independent , still drives   6.  Fall Risk:  no recent falls, counseled, see instructions 7. Home Safety: does feel safe at home 8. Height, weight, &visual acuity: see VS, vision ok, s/p cataract surgery, sees  eye doctor q 3 months (glaucoma) 9.  Counseling: yes , see below   10. Labs ordered based on risk factors: yes 11.  Referral Coordination, if needed   12.  Care Plan, see a/p   13. Cognitive Assessment: motor skills appropriate for age, cognition and memory seem appropriate 14. Providers list updated 15. End of life care discussed last year, states he now has a HC POA   In addition we discussed Chronic and acute issues: Atrial fibrillation: Note from cardiology reviewed Hypothyroidism: Note from endocrinology reviewed. Medication list reviewed, good compliance. Has right knee pain for several days, some swelling, decreased range of motion but it gets better after he is active. Denies any injury, fever, chills, knee is not red or hot. Also complaining of neck stiffness and decreased range of motion, no actual pain. No upper or lower extremity paresthesias or difficulty with his gait. Has a rash of the face, referral? Likes to discuss the latest CT chest.   Review of Systems Constitutional: No fever. No chills. No unexplained wt changes. No unusual sweats  HEENT: No dental problems, no ear discharge, no facial swelling, no voice changes. No eye discharge, no eye  redness , no  intolerance to light   Respiratory: No wheezing , no  difficulty breathing. No cough , no mucus  production  Cardiovascular: No CP, no leg swelling , no  Palpitations  GI: no nausea, no vomiting, no diarrhea , no  abdominal pain.  No blood in the stools. No dysphagia, no odynophagia    Endocrine: No polyphagia, no polyuria , no polydipsia  GU: No dysuria, gross hematuria, difficulty urinating. No urinary urgency, no frequency.  Musculoskeletal: See above  Skin:  See above  Allergic, immunologic: No environmental allergies , no  food allergies  Neurological: No dizziness no  syncope. No headaches. No diplopia, no slurred, no slurred speech, no motor deficits, no facial  Numbness  Hematological: No enlarged lymph nodes, no easy bruising , no unusual bleedings  Psychiatry: No suicidal ideas, no hallucinations, no beavior problems, no confusion.  No unusual/severe anxiety, no depression   Past Medical History:  Diagnosis Date  . Anemia   . Appendicitis    SBP 7/09 ruputre w/ RLQ abscess 03/16/08  . Atrial fibrillation ([place])   . Basal cell carcinoma of nose 2016   Right side nasal tip, [name]  . BPH (benign prostatic hyperplasia) 06/29/2011  . Cardiomyopathy    secondary to Tachycardia. A-Acute on chronic admit for CHF 6/08. B- CHF coupled with atrial fib RVR electrophysiology study 09/27/07.  A- Multiple flutters and Atrial fib- no ablation performed b- ibutilide cardoversion c- amiordarone load- d/c in sinus rhyrthm d- coumadin anticoagulation- stopped at time of GI bleed 11/09  . Glaucoma   . History of blood transfusion  2009   "related to rupture in esophagus" (04/13/2016)  . Hyperlipidemia   . Hypertension   . Knee fracture, right 08/2014   "no OR" (04/13/2016)  . Lung nodules    [name] 04/13/2016  . Mallory - Weiss tear    esophagitis, 6 unit hemorrhage- EPI injections   . Small bowel obstruction     Past Surgical History:  Procedure Laterality Date  . APPENDECTOMY  02/2008   exploratory, ruptured appendix, RLQ abscess  . BASAL CELL CARCINOMA EXCISION     nose   . CATARACT EXTRACTION W/ INTRAOCULAR LENS  IMPLANT, BILATERAL Bilateral   . COLONOSCOPY    . COLONOSCOPY W/ BIOPSIES AND POLYPECTOMY  ~ 2015  . EP study  09/27/07   see pmh  . EXPLORATORY LAPAROTOMY  02/2008  . HEMORRHOID SURGERY    . TEE WITHOUT CARDIOVERSION N/A 02/08/2016   Procedure: TRANSESOPHAGEAL ECHOCARDIOGRAM (TEE);  Surgeon: Fay Records, MD;  Location: [facility] ENDOSCOPY;  Service: Cardiovascular;  Laterality: N/A;  . TEE WITHOUT CARDIOVERSION N/A 04/11/2016   Procedure: TRANSESOPHAGEAL ECHOCARDIOGRAM (TEE);  Surgeon: Josue Hector, MD;  Location: [facility] ENDOSCOPY;  Service: Cardiovascular;  Laterality: N/A;  . TEE/DCCV  02/07/07    Social History   Social History  . Marital status: Married    Spouse name: N/A  . Number of children: 3  . Years of education: N/A   Occupational History  . retired --Catering manager     works part time   Social History Main Topics  . Smoking status: Former Smoker    Packs/day: 1.00    Years: 23.00    Quit date: 06/30/1971  . Smokeless tobacco: Never Used  . Alcohol use No  . Drug use: No  . Sexual activity: No   Other Topics Concern  . Not on file   Social History Narrative   Household-- pt and wife (wife developing dementia)   One son lives in [place], goes there from time to time               Family History  Problem Relation Age of Onset  . Uterine cancer Mother   . Heart attack Maternal Uncle   . Heart disease Maternal Uncle   . Colon cancer Neg Hx   . Prostate cancer Neg Hx   . Diabetes Neg Hx       Medication List       Accurate as of 07/08/16 11:59 PM. Always use your most recent med list.          CENTRUM tablet Take 1 tablet by mouth daily.   diltiazem 120 MG 24 hr capsule Commonly known as:  CARDIZEM CD Take 1 capsule (120 mg total) by mouth daily.   dofetilide 125 MCG capsule Commonly known as:  TIKOSYN Take 375 mcg by mouth 2 (two) times daily.   dorzolamide 2 % ophthalmic solution Commonly known as:   TRUSOPT Place 1 drop into both eyes 2 (two) times daily.   ELIQUIS 5 MG Tabs tablet Generic drug:  apixaban Take 5 mg by mouth 2 (two) times daily.   furosemide 20 MG tablet Commonly known as:  LASIX Take one tablet (20 mg) by mouth once daily as needed for swelling/ shortness of breath   metoprolol 50 MG tablet Commonly known as:  LOPRESSOR Take 50 mg by mouth 2 (two) times daily.          Objective:   Physical Exam BP 130/78 (BP Location: Left Arm, Patient Position: Sitting,  Cuff Size: Normal)   Pulse 62   Temp 97.9 F (36.6 C) (Oral)   Resp 14   Ht 6\' 3"  (1.905 m)   Wt 252 lb (114.3 kg)   SpO2 94%   BMI 31.50 kg/m   General:   Well developed, well nourished . NAD.  Neck:  No  thyromegaly  No TTP at the cervical spine. Range of motion is definitely decreased throughout. HEENT:  Normocephalic . Face symmetric, atraumatic. HOH despite using hearing aids Lungs:  CTA B Normal respiratory effort, no intercostal retractions, no accessory muscle use. Heart: RRR,  no murmur.  No pretibial edema bilaterally  Abdomen:  Not distended, soft, non-tender. No rebound or rigidity.   Skin: At both sides of the face has skin lesions: papular, irregular, somewhat confluent, slightly hyperpigmented Neurologic:  alert & oriented X3.  Speech normal, gait appropriate for age and unassisted Strength symmetric and appropriate for age.  MSK: Both knees bony deformities consistent with DJD. Right knee is a slightly swollen, suspect effusion, no hot or red. Psych: Cognition and judgment appear intact.  Cooperative with normal attention span and concentration.  Behavior appropriate. No anxious or depressed appearing.    Assessment & Plan:   Assessment  HTN Hyperlipidemia: Has consistently declined to take medications, not checking FLPs BPH CV: --CHF, Cardiomyopathy due to tachycardia: resolved  --h/o A. Fib, tikosyn, eliquis  Skin cancer:  BCC Glaucoma ENDO: --Hyperthyroidism likely induced by Pacerone dx 10-2015 --Thyromegaly: [place] 07/2014, multiple nodules, no biopsy criteria Abnormal CT chest 8 06/20/2016. See report H/o GI bleed: Mallory Wise tear, PRBC 6 H/o Appendicitis 2009 ruptured H/o SBO   PLAN: HTN: Controlled on metoprolol and Cardizem. Last BMP satisfactory A. fibrillation: Per cardiology, anticoagulated,  seems to be doing well. H/o skin cancer, has skin lesions, refer to dermatology.  Hyperthyroidism: He is now euthyroid, Endo note reviewed, plan is to recheck in 6 months. Last TFTs satisfactory DJD: c/o stiff neck and swollen and painful right knee. 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 Knee efusion ?. Plan: Refer to sports meds  for the knee, Tylenol, ice. Offered PT for the neck  Abnormal CT chest scan: Had an abnormal chest x-ray follow-up by CT chest 04/13/2016: Multiple pulmonary nodules, multiple compression fractures. Will repeat CT by 09-2016 Compression fractures: Recommend a bone density test RTC 6 months   In addition to his medicare AWV I spent more than 30 minutes with the patient reviewing the chart, assessing chronic medical problems, discussing his abnormal CT chest.

## 2016-07-08 NOTE — Assessment & Plan Note (Addendum)
Td 2011 ;pneumonia shot 2009 ; prevnar-- 2015;  Flu shot today Zostavax-- never got it, see previous entries  09-2011 colonoscopy , Cscope again 11/2012--per Dr Ardis Hughs; adenomatous polyp--due to age no recall  Prostate cancer screening: See previous entries, no further screening  . diet and exercise discussed

## 2016-07-10 NOTE — Assessment & Plan Note (Signed)
HTN: Controlled on metoprolol and Cardizem. Last BMP satisfactory A. fibrillation: Per cardiology, anticoagulated,  seems to be doing well. H/o skin cancer, has skin lesions, refer to dermatology.  Hyperthyroidism: He is now euthyroid, Endo note reviewed, plan is to recheck in 6 months. Last TFTs satisfactory DJD: c/o stiff neck and swollen and painful right knee. 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 Knee efusion ?. Plan: Refer to sports meds  for the knee, Tylenol, ice. Offered PT for the neck  Abnormal CT chest scan: Had an abnormal chest x-ray follow-up by CT chest 04/13/2016: Multiple pulmonary nodules, multiple compression fractures. Will repeat CT by 09-2016 Compression fractures: Recommend a bone density test RTC 6 months

## 2016-07-12 ENCOUNTER — Telehealth: Payer: Self-pay

## 2016-07-12 ENCOUNTER — Encounter: Payer: Self-pay | Admitting: Family Medicine

## 2016-07-12 ENCOUNTER — Ambulatory Visit (INDEPENDENT_AMBULATORY_CARE_PROVIDER_SITE_OTHER): Payer: Medicare Other | Admitting: Family Medicine

## 2016-07-12 DIAGNOSIS — M25561 Pain in right knee: Secondary | ICD-10-CM | POA: Diagnosis not present

## 2016-07-12 NOTE — Patient Instructions (Signed)
Your knee pain and swelling are due to arthritis. These are the different medicines you can take for this: Tylenol 500mg  1-2 tabs three times a day for pain. Glucosamine sulfate 750mg  twice a day is a supplement that may help. Capsaicin, aspercreme, or biofreeze topically up to four times a day may also help with pain. Cortisone injections are an option - call me if you want to try one of these. If cortisone injections do not help, there are different types of shots that may help but they take longer to take effect. It's important that you continue to stay active. Consider physical therapy to strengthen muscles around the joint that hurts to take pressure off of the joint itself. Shoe inserts with good arch support may be helpful. Walker or cane if needed. Heat or ice 15 minutes at a time 3-4 times a day as needed to help with pain. Follow up with me as needed otherwise.

## 2016-07-12 NOTE — Telephone Encounter (Signed)
Left voicemail message that Antonio [name] has arrived from [employer]. medicine will be placed at front desk.

## 2016-07-17 DIAGNOSIS — M25561 Pain in right knee: Secondary | ICD-10-CM | POA: Insufficient documentation

## 2016-07-17 NOTE — Progress Notes (Signed)
PCP and consultation requested by: Kathlene November, MD  Subjective:   HPI: Patient is a 82 y.o. male here for right knee swelling.  Patient reports for about 4 days he's had worsening swelling of right knee. Worse by end of day. Knee feels weak as well. Pain is 6-7/10 level, can be sharp anterior and deep. Feels some better today. No warmth, redness, fever, skin changes otherwise. No numbness. Has tried only a topical cream for this with mild benefit.  Past Medical History:  Diagnosis Date  . Anemia   . Appendicitis    SBP 7/09 ruputre w/ RLQ abscess 03/16/08  . Atrial fibrillation ([place])   . Basal cell carcinoma of nose 2016   Right side nasal tip, [name]  . BPH (benign prostatic hyperplasia) 06/29/2011  . Cardiomyopathy    secondary to Tachycardia. A-Acute on chronic admit for CHF 6/08. B- CHF coupled with atrial fib RVR electrophysiology study 09/27/07.  A- Multiple flutters and Atrial fib- no ablation performed b- ibutilide cardoversion c- amiordarone load- d/c in sinus rhyrthm d- coumadin anticoagulation- stopped at time of GI bleed 11/09  . Glaucoma   . History of blood transfusion 2009   "related to rupture in esophagus" (04/13/2016)  . Hyperlipidemia   . Hypertension   . Knee fracture, right 08/2014   "no OR" (04/13/2016)  . Lung nodules    [name] 04/13/2016  . Mallory - Weiss tear    esophagitis, 6 unit hemorrhage- EPI injections   . Small bowel obstruction     Current Outpatient Prescriptions on File Prior to Visit  Medication Sig Dispense Refill  . apixaban (ELIQUIS) 5 MG TABS tablet Take 5 mg by mouth 2 (two) times daily.    Marland Kitchen diltiazem (CARDIZEM CD) 120 MG 24 hr capsule Take 1 capsule (120 mg total) by mouth daily. 90 capsule 3  . dofetilide (TIKOSYN) 125 MCG capsule Take 375 mcg by mouth 2 (two) times daily.     . dorzolamide (TRUSOPT) 2 % ophthalmic solution Place 1 drop into both eyes 2 (two) times daily.      . furosemide (LASIX) 20 MG tablet Take one tablet (20  mg) by mouth once daily as needed for swelling/ shortness of breath 30 tablet 2  . metoprolol (LOPRESSOR) 50 MG tablet Take 50 mg by mouth 2 (two) times daily.    . Multiple Vitamins-Minerals (CENTRUM) tablet Take 1 tablet by mouth daily.       No current facility-administered medications on file prior to visit.     Past Surgical History:  Procedure Laterality Date  . APPENDECTOMY  02/2008   exploratory, ruptured appendix, RLQ abscess  . BASAL CELL CARCINOMA EXCISION     nose  . CATARACT EXTRACTION W/ INTRAOCULAR LENS  IMPLANT, BILATERAL Bilateral   . COLONOSCOPY    . COLONOSCOPY W/ BIOPSIES AND POLYPECTOMY  ~ 2015  . EP study  09/27/07   see pmh  . EXPLORATORY LAPAROTOMY  02/2008  . HEMORRHOID SURGERY    . TEE WITHOUT CARDIOVERSION N/A 02/08/2016   Procedure: TRANSESOPHAGEAL ECHOCARDIOGRAM (TEE);  Surgeon: Fay Records, MD;  Location: [facility] ENDOSCOPY;  Service: Cardiovascular;  Laterality: N/A;  . TEE WITHOUT CARDIOVERSION N/A 04/11/2016   Procedure: TRANSESOPHAGEAL ECHOCARDIOGRAM (TEE);  Surgeon: Josue Hector, MD;  Location: [facility] ENDOSCOPY;  Service: Cardiovascular;  Laterality: N/A;  . TEE/DCCV  02/07/07    Allergies  Allergen Reactions  . Amiodarone Other (See Comments)    Thyroid toxicity    Social History  Social History  . Marital status: Married    Spouse name: N/A  . Number of children: 3  . Years of education: N/A   Occupational History  . retired --Catering manager     works part time   Social History Main Topics  . Smoking status: Former Smoker    Packs/day: 1.00    Years: 23.00    Quit date: 06/30/1971  . Smokeless tobacco: Never Used  . Alcohol use No  . Drug use: No  . Sexual activity: No   Other Topics Concern  . Not on file   Social History Narrative   Household-- pt and wife (wife developing dementia)   One son lives in [place], goes there from time to time              Family History  Problem Relation Age of Onset  . Uterine cancer Mother    . Heart attack Maternal Uncle   . Heart disease Maternal Uncle   . Colon cancer Neg Hx   . Prostate cancer Neg Hx   . Diabetes Neg Hx     BP (!) 150/87   Pulse 64   Ht 6\' 3"  (1.905 m)   Wt 250 lb (113.4 kg)   BMI 31.25 kg/m   Review of Systems: See HPI above.     Objective:  Physical Exam:  Gen: NAD, comfortable in exam room  Right knee: No gross deformity, ecchymoses.  Minimal effusion currently. No TTP. FROM. Negative ant/post drawers. Negative valgus/varus testing. Negative lachmanns. Negative mcmurrays, apleys, patellar apprehension. NV intact distally.  Left knee: FROM without pain.   Assessment & Plan:  1. Right knee pain - currently only minimal effusion - no evidence gouty arthritis.  Consistent with flare of DJD.  Discussed tylenol, glucosamine, topical medications.  He will consider injection if pain bothers him enough.  Shown home exercises to do daily also.  Heat or ice.  F/u prn.

## 2016-07-17 NOTE — Assessment & Plan Note (Signed)
currently only minimal effusion - no evidence gouty arthritis.  Consistent with flare of DJD.  Discussed tylenol, glucosamine, topical medications.  He will consider injection if pain bothers him enough.  Shown home exercises to do daily also.  Heat or ice.  F/u prn.

## 2016-07-19 ENCOUNTER — Ambulatory Visit (INDEPENDENT_AMBULATORY_CARE_PROVIDER_SITE_OTHER): Payer: Medicare Other | Admitting: Pharmacist

## 2016-07-19 DIAGNOSIS — I481 Persistent atrial fibrillation: Secondary | ICD-10-CM

## 2016-07-19 DIAGNOSIS — I4821 Permanent atrial fibrillation: Secondary | ICD-10-CM

## 2016-07-19 DIAGNOSIS — I482 Chronic atrial fibrillation, unspecified: Secondary | ICD-10-CM

## 2016-07-19 DIAGNOSIS — Z5181 Encounter for therapeutic drug level monitoring: Secondary | ICD-10-CM

## 2016-07-19 DIAGNOSIS — I4819 Other persistent atrial fibrillation: Secondary | ICD-10-CM

## 2016-07-19 LAB — CBC
HEMATOCRIT: 42.9 % (ref 38.5–50.0)
HEMOGLOBIN: 14.6 g/dL (ref 13.2–17.1)
MCH: 31.8 pg (ref 27.0–33.0)
MCHC: 34 g/dL (ref 32.0–36.0)
MCV: 93.5 fL (ref 80.0–100.0)
MPV: 9.7 fL (ref 7.5–12.5)
PLATELETS: 226 10*3/uL (ref 140–400)
RBC: 4.59 MIL/uL (ref 4.20–5.80)
RDW: 13.6 % (ref 11.0–15.0)
WBC: 7.3 10*3/uL (ref 3.8–10.8)

## 2016-07-19 LAB — BASIC METABOLIC PANEL
BUN: 19 mg/dL (ref 7–25)
CALCIUM: 9.4 mg/dL (ref 8.6–10.3)
CHLORIDE: 109 mmol/L (ref 98–110)
CO2: 24 mmol/L (ref 20–31)
CREATININE: 0.89 mg/dL (ref 0.70–1.11)
Glucose, Bld: 118 mg/dL — ABNORMAL HIGH (ref 65–99)
Potassium: 4.1 mmol/L (ref 3.5–5.3)
Sodium: 142 mmol/L (ref 135–146)

## 2016-07-19 NOTE — Progress Notes (Signed)
Pt was started on Eliquis for Afib in Oct 2017.  Reviewed patients medication list.  Pt is not currently on any combined P-gp and strong CYP3A4 inhibitors/inducers (ketoconazole, traconazole, ritonavir, carbamazepine, phenytoin, rifampin, St. John's wort).  Reviewed labs.  SCr 0.89, Weight 250 lbs (113 kgs), Age >80.  Dose is  based on age, weight, and SCr.  Hgb and HCT WNL.   A full discussion of the nature of anticoagulants has been carried out.  A benefit/risk analysis has been presented to the patient, so that they understand the justification for choosing anticoagulation with Eliquis at this time.  The need for compliance is stressed.  Pt is aware to take the medication twice daily.  Side effects of potential bleeding are discussed, including unusual colored urine or stools, coughing up blood or coffee ground emesis, nose bleeds or serious fall or head trauma.  Discussed signs and symptoms of stroke. The patient should avoid any OTC items containing aspirin or ibuprofen.  Avoid alcohol consumption.   Call if any signs of abnormal bleeding.  Discussed financial obligations and resolved any difficulty in obtaining medication.  Next lab test test in 6 months.  Please call "other phone" if no answer on primary number.   Spoke to pt and informed of above. Appt made for 6 months.

## 2016-07-26 ENCOUNTER — Ambulatory Visit (HOSPITAL_COMMUNITY)
Admission: RE | Admit: 2016-07-26 | Discharge: 2016-07-26 | Disposition: A | Discharge status: Home / Self Care | Payer: Medicare Other | Source: Ambulatory Visit | Attending: Nurse Practitioner | Admitting: Nurse Practitioner

## 2016-07-26 ENCOUNTER — Encounter (HOSPITAL_COMMUNITY): Payer: Self-pay | Admitting: Nurse Practitioner

## 2016-07-26 VITALS — BP 108/66 | HR 60 | Ht <= 58 in | Wt 251.2 lb

## 2016-07-26 DIAGNOSIS — Z9889 Other specified postprocedural states: Secondary | ICD-10-CM | POA: Insufficient documentation

## 2016-07-26 DIAGNOSIS — Z79899 Other long term (current) drug therapy: Secondary | ICD-10-CM | POA: Insufficient documentation

## 2016-07-26 DIAGNOSIS — I480 Paroxysmal atrial fibrillation: Secondary | ICD-10-CM

## 2016-07-26 DIAGNOSIS — E0590 Thyrotoxicosis, unspecified without thyrotoxic crisis or storm: Secondary | ICD-10-CM | POA: Insufficient documentation

## 2016-07-26 DIAGNOSIS — Z87891 Personal history of nicotine dependence: Secondary | ICD-10-CM | POA: Diagnosis not present

## 2016-07-26 DIAGNOSIS — Z5189 Encounter for other specified aftercare: Secondary | ICD-10-CM | POA: Insufficient documentation

## 2016-07-26 DIAGNOSIS — R918 Other nonspecific abnormal finding of lung field: Secondary | ICD-10-CM | POA: Diagnosis not present

## 2016-07-26 DIAGNOSIS — I481 Persistent atrial fibrillation: Secondary | ICD-10-CM | POA: Insufficient documentation

## 2016-07-26 DIAGNOSIS — I4819 Other persistent atrial fibrillation: Secondary | ICD-10-CM

## 2016-07-26 NOTE — Progress Notes (Addendum)
Patient ID: Antonio [name], male   DOB: 1929-04-01, 82 y.o.   MRN: [id number]     Primary Care Physician: Kathlene November, MD Referring Physician: [name], PA Electrophysiologist: [name] Cardiologist: None   Antonio [name] is a 82 y.o. male with a h/o afib that had maintained SR on amiodarone x 7-8 years. Prior to that had [place] which normalized with return of SR.  While visiting with his daughter several weeks ago in [name], [place], he had a syncopal episode. He was hospitalized at [name] was found to hyperthyroid with afib RVR. Amiodarone was d/ced. He was off anticoagulation due to remote  GI bleed and pt's desires  not to resume blood thinners. Plans were made for direct current cardioversion, however a TEE was performed which showed a left atrial thrombus. Left ventricular ejection fraction was normal at 55-60%.Chadsvasc score  at least 3. There were no significant valve abnormalities. Due to visualization of a left atrial thrombus, his cardioversion was canceled. He was placed on anticoagulation therapy with Eliquis, 5 mg twice a day. He was treated with rate control agents, Cardizem 120 mg in addition to Metroprolol 12.5 mg twice a day. He was instructed to continue aspirin along with Eliquis.  He was seen by [name], [place] 5/8 and referred to afib clinic for further addressing of restoring SR with AAD's and repeat cardioversion, when  thought to be safe to resume when thyroid  normalized and thrombus dissolved.QTc today is 523 ms, but RBBB is contributing.Qtc in SR around 440 ms. Last TSH drawn yesterday shows that thyroid is improving off amiodarone and on methimazole 10 mg daily. TSH 0.07 3 months ago and 6/7 0.31 and methimazole reduced to 1 tab daily by [name].   Pt is symptomatic with Afib and is c/o fatigue and shortness of breath with activity. No PND, orthopnea, appears normvolemic. Off amiodarone around 6 weeks now. Rate control is reasonable and  reports HR in 80's/low 90's at home. On BB/CCB.  Returns 7/10, he was set up for Tikosyn, hospitalization preceded with TEE to make sure left thrombus was resolved. Unfortunately, It was not. Plans for tikosyn were postponed and decision was made to start coumadin, stop DOAC. He has been on coumadin for 3 weeks, but has only had one INR that was therapeutic and last one was 1.3. He will have to have 3-4 weeks of therapeutic INR's before repeat TEE can be entertained. He has had lower extremity swelling which he thinks has come about since starting diltiazem. Since his thyroid is normalizing off amiodarone, his HR is better controlled in the 60's and may be able to do without cardizem now. Tolerating afib better at slower v rate.  He returns afib clinic 8/16 and has had 3 successive weekly therapeutic INR's and is pending the 4th INR on Monday. If therapeutic, plans are for TEE and admission for tikosyn that same day. He is now off methimazole, stopped when he saw [name] 8/14. Now TSH is chronically high. Pt is c/o of fatigue and shortness of breath. He has RVR today at 122 bpm. LLE did not improve with stopping Cardizem in July. Weight is up gradually 6 lbs since early July , I think this may be weight gain and not fluid since when he developed hyperthyroidism, his weight dropped to 228 from 260. He is not admitting to PND/orthopnea.   Returns 8/31, after hospitalization for Tikosyn. He did convert to SR and did not require cardioversion. Repeat  TEE revealed dissolved clot. He felt very well on d/c from hospital but last few days not as well. EKG shows aflutter with variable AV block, qtc 522. Last QTC in hospital SR 528, with RBBB contributing.. The daughter thinks he had a hard coughing spell and  he may have had returned to afib at that time. Tikosyn is being taken properly. Warfarin checked 8/29 and is 3.0. He does feel better that prior to hospitalization and v rate controlled. LLE much  improved.  Returns to afib clinic 9/12 and remains in afib with cvr.  The plan is to try cardioversion on tikosyn before saying he failed drug. The daughter reports that since she has spent more time with her father lately, she has noticed that he falls asleep easily and has spells of not breathing when asleep. Will order sleep study.   Returns to [place] clinic 12/5 and is in [place]. Cardioversion was planned but he presented in SR and feels that he has been in SR since then. He feels better. He is taking tikosyn and eliquis correctly.Sleep study did not show any significant sleep apnea.  Today, he denies symptoms of  chest pain,  orthopnea, PND, lower extremity edema, dizziness, presyncope, syncope, or neurologic sequela. Positive for fatigue The patient is tolerating medications without difficulties and is otherwise without complaint today.   Past Medical History:  Diagnosis Date  . Anemia   . Appendicitis    SBP 7/09 ruputre w/ RLQ abscess 03/16/08  . Atrial fibrillation ([place])   . Basal cell carcinoma of nose 2016   Right side nasal tip, [name]  . BPH (benign prostatic hyperplasia) 06/29/2011  . Cardiomyopathy    secondary to Tachycardia. A-Acute on chronic admit for CHF 6/08. B- CHF coupled with atrial fib RVR electrophysiology study 09/27/07.  A- Multiple flutters and Atrial fib- no ablation performed b- ibutilide cardoversion c- amiordarone load- d/c in sinus rhyrthm d- coumadin anticoagulation- stopped at time of GI bleed 11/09  . Glaucoma   . History of blood transfusion 2009   "related to rupture in esophagus" (04/13/2016)  . Hyperlipidemia   . Hypertension   . Knee fracture, right 08/2014   "no OR" (04/13/2016)  . Lung nodules    [name] 04/13/2016  . [place] - Weiss tear    esophagitis, 6 unit hemorrhage- EPI injections   . Small bowel obstruction    Past Surgical History:  Procedure Laterality Date  . APPENDECTOMY  02/2008   exploratory, ruptured appendix, RLQ abscess  . BASAL  CELL CARCINOMA EXCISION     nose  . CATARACT EXTRACTION W/ INTRAOCULAR LENS  IMPLANT, BILATERAL Bilateral   . COLONOSCOPY    . COLONOSCOPY W/ BIOPSIES AND POLYPECTOMY  ~ 2015  . EP study  09/27/07   see pmh  . EXPLORATORY LAPAROTOMY  02/2008  . HEMORRHOID SURGERY    . TEE WITHOUT CARDIOVERSION N/A 02/08/2016   Procedure: TRANSESOPHAGEAL ECHOCARDIOGRAM (TEE);  Surgeon: Fay Records, MD;  Location: [facility] ENDOSCOPY;  Service: Cardiovascular;  Laterality: N/A;  . TEE WITHOUT CARDIOVERSION N/A 04/11/2016   Procedure: TRANSESOPHAGEAL ECHOCARDIOGRAM (TEE);  Surgeon: Josue Hector, MD;  Location: [facility] ENDOSCOPY;  Service: Cardiovascular;  Laterality: N/A;  . TEE/DCCV  02/07/07    Current Outpatient Prescriptions  Medication Sig Dispense Refill  . apixaban (ELIQUIS) 5 MG TABS tablet Take 5 mg by mouth 2 (two) times daily.    Marland Kitchen diltiazem (CARDIZEM CD) 120 MG 24 hr capsule Take 1 capsule (120 mg total) by  mouth daily. 90 capsule 3  . dofetilide (TIKOSYN) 125 MCG capsule Take 375 mcg by mouth 2 (two) times daily.     . dorzolamide (TRUSOPT) 2 % ophthalmic solution Place 1 drop into both eyes 2 (two) times daily.      . furosemide (LASIX) 20 MG tablet Take one tablet (20 mg) by mouth once daily as needed for swelling/ shortness of breath 30 tablet 2  . metoprolol (LOPRESSOR) 50 MG tablet Take 50 mg by mouth 2 (two) times daily.    . Multiple Vitamins-Minerals (CENTRUM) tablet Take 1 tablet by mouth daily.       No current facility-administered medications for this encounter.     Allergies  Allergen Reactions  . Amiodarone Other (See Comments)    Thyroid toxicity    Social History   Social History  . Marital status: Married    Spouse name: N/A  . Number of children: 3  . Years of education: N/A   Occupational History  . retired --Catering manager     works part time   Social History Main Topics  . Smoking status: Former Smoker    Packs/day: 1.00    Years: 23.00    Quit date: 06/30/1971  .  Smokeless tobacco: Never Used  . Alcohol use No  . Drug use: No  . Sexual activity: No   Other Topics Concern  . Not on file   Social History Narrative   Household-- pt and wife (wife developing dementia)   One son lives in [place], goes there from time to time              Family History  Problem Relation Age of Onset  . Uterine cancer Mother   . Heart attack Maternal Uncle   . Heart disease Maternal Uncle   . Colon cancer Neg Hx   . Prostate cancer Neg Hx   . Diabetes Neg Hx     ROS- All systems are reviewed and negative except as per the HPI above  Physical Exam: Vitals:   07/26/16 1135  BP: 108/66  Pulse: 60  Weight: 251 lb 3.2 oz (113.9 kg)  Height: 1' (0.305 m)    GEN- The patient is well appearing, alert and oriented x 3 today.   Head- normocephalic, atraumatic Eyes-  Sclera clear, conjunctiva pink Ears- hearing intact Oropharynx- clear Neck- supple, no JVP Lymph- no cervical lymphadenopathy Lungs- Clear to ausculation bilaterally, normal work of breathing Heart- regular rate and rhythm, no murmurs, rubs or gallops, PMI not laterally displaced GI- soft, NT, ND, + BS Extremities- no clubbing, cyanosis,  2+ bilateral edema MS- no significant deformity or atrophy Skin- no rash or lesion Psych- euthymic mood, full affect Neuro- strength and sensation are intact  EKG- SR with first degree AV block, LAD, RBBB pr int 210 ms, qrs int 154 ms, qtc 502 ms Epic records reviewed CT chest w/o contrast- IMPRESSION: Multiple small bilateral pulmonary nodules are identified and cannot be definitively characterized. Non-contrast chest CT at 3-6 months is recommended. If the nodules are stable at time of repeat CT, then future CT at 18-24 months (from today's scan) is considered optional for low-risk patients, but is recommended for high-risk patients. This recommendation follows the consensus statement: Guidelines for Management of Incidental Pulmonary Nodules  Detected on CT Images:From the Fleischner Society 2017; published online before print (10.1148/radiol.SG:5268862).  Small bilateral pleural effusions.  Cardiomegaly.  Calcific aortic and coronary atherosclerosis.  Multiple thoracic spine compression fractures. The T7 compression  fracture appears new since 2016 the cannot be definitively Characterized.  Bmet checked 11/28 with Kt of 4.1, creat 0.89, Mag 10/25 1.9  Assessment and Plan: 1.Persistent symptomatic  afib secondary to thyroid toxicity  Maintaining SR Continue with metoprolol and cardizem Continue eliquis  2. Hyperthyroidism Resolved  Off methimazole  Per Endocrinologist  3.Persisitent left atrial thrombus Resolved   4. Pulmonology nodules Abnormal CXR Per pcp, plans for f/u 09/2016  F/u 3 months in afib clinic  [place]. Emanii Bugbee, [facility] [address] [place], [place] [address] [phone]

## 2016-09-01 DIAGNOSIS — H04123 Dry eye syndrome of bilateral lacrimal glands: Secondary | ICD-10-CM | POA: Diagnosis not present

## 2016-09-01 DIAGNOSIS — H11423 Conjunctival edema, bilateral: Secondary | ICD-10-CM | POA: Diagnosis not present

## 2016-09-01 DIAGNOSIS — H18413 Arcus senilis, bilateral: Secondary | ICD-10-CM | POA: Diagnosis not present

## 2016-09-01 DIAGNOSIS — H47233 Glaucomatous optic atrophy, bilateral: Secondary | ICD-10-CM | POA: Diagnosis not present

## 2016-09-01 DIAGNOSIS — H401113 Primary open-angle glaucoma, right eye, severe stage: Secondary | ICD-10-CM | POA: Diagnosis not present

## 2016-09-01 DIAGNOSIS — H11153 Pinguecula, bilateral: Secondary | ICD-10-CM | POA: Diagnosis not present

## 2016-09-01 DIAGNOSIS — H401122 Primary open-angle glaucoma, left eye, moderate stage: Secondary | ICD-10-CM | POA: Diagnosis not present

## 2016-09-01 DIAGNOSIS — H16143 Punctate keratitis, bilateral: Secondary | ICD-10-CM | POA: Diagnosis not present

## 2016-09-05 ENCOUNTER — Telehealth: Payer: Self-pay | Admitting: Internal Medicine

## 2016-09-05 NOTE — Telephone Encounter (Signed)
Patient is following up to see if we have received paperwork for patient for the Patient Assistance program? Please call, thanks.

## 2016-09-06 ENCOUNTER — Encounter (HOSPITAL_COMMUNITY): Payer: Self-pay | Admitting: Emergency Medicine

## 2016-09-06 ENCOUNTER — Inpatient Hospital Stay (HOSPITAL_COMMUNITY)
Admission: EM | Admit: 2016-09-06 | Discharge: 2016-09-22 | DRG: 963 | Disposition: E | Payer: Medicare Other | Attending: Surgery | Admitting: Surgery

## 2016-09-06 ENCOUNTER — Emergency Department (HOSPITAL_COMMUNITY): Payer: Medicare Other

## 2016-09-06 DIAGNOSIS — Z79899 Other long term (current) drug therapy: Secondary | ICD-10-CM

## 2016-09-06 DIAGNOSIS — Z7901 Long term (current) use of anticoagulants: Secondary | ICD-10-CM | POA: Diagnosis not present

## 2016-09-06 DIAGNOSIS — S3991XA Unspecified injury of abdomen, initial encounter: Secondary | ICD-10-CM | POA: Diagnosis not present

## 2016-09-06 DIAGNOSIS — Z515 Encounter for palliative care: Secondary | ICD-10-CM

## 2016-09-06 DIAGNOSIS — I6201 Nontraumatic acute subdural hemorrhage: Secondary | ICD-10-CM | POA: Diagnosis not present

## 2016-09-06 DIAGNOSIS — S0281XA Fracture of other specified skull and facial bones, right side, initial encounter for closed fracture: Secondary | ICD-10-CM | POA: Diagnosis present

## 2016-09-06 DIAGNOSIS — I480 Paroxysmal atrial fibrillation: Secondary | ICD-10-CM | POA: Diagnosis not present

## 2016-09-06 DIAGNOSIS — Z961 Presence of intraocular lens: Secondary | ICD-10-CM | POA: Diagnosis present

## 2016-09-06 DIAGNOSIS — S12100A Unspecified displaced fracture of second cervical vertebra, initial encounter for closed fracture: Secondary | ICD-10-CM | POA: Diagnosis not present

## 2016-09-06 DIAGNOSIS — S065X9A Traumatic subdural hemorrhage with loss of consciousness of unspecified duration, initial encounter: Secondary | ICD-10-CM | POA: Diagnosis not present

## 2016-09-06 DIAGNOSIS — I471 Supraventricular tachycardia: Secondary | ICD-10-CM | POA: Diagnosis not present

## 2016-09-06 DIAGNOSIS — S098XXA Other specified injuries of head, initial encounter: Secondary | ICD-10-CM | POA: Diagnosis not present

## 2016-09-06 DIAGNOSIS — S0219XA Other fracture of base of skull, initial encounter for closed fracture: Secondary | ICD-10-CM | POA: Diagnosis not present

## 2016-09-06 DIAGNOSIS — I7103 Dissection of thoracoabdominal aorta: Secondary | ICD-10-CM | POA: Diagnosis not present

## 2016-09-06 DIAGNOSIS — Z9841 Cataract extraction status, right eye: Secondary | ICD-10-CM

## 2016-09-06 DIAGNOSIS — S0231XA Fracture of orbital floor, right side, initial encounter for closed fracture: Secondary | ICD-10-CM | POA: Diagnosis not present

## 2016-09-06 DIAGNOSIS — S066X9A Traumatic subarachnoid hemorrhage with loss of consciousness of unspecified duration, initial encounter: Secondary | ICD-10-CM | POA: Diagnosis not present

## 2016-09-06 DIAGNOSIS — H409 Unspecified glaucoma: Secondary | ICD-10-CM | POA: Diagnosis present

## 2016-09-06 DIAGNOSIS — I110 Hypertensive heart disease with heart failure: Secondary | ICD-10-CM | POA: Diagnosis present

## 2016-09-06 DIAGNOSIS — I4510 Unspecified right bundle-branch block: Secondary | ICD-10-CM | POA: Diagnosis present

## 2016-09-06 DIAGNOSIS — S0240EA Zygomatic fracture, right side, initial encounter for closed fracture: Secondary | ICD-10-CM | POA: Diagnosis present

## 2016-09-06 DIAGNOSIS — S00432A Contusion of left ear, initial encounter: Secondary | ICD-10-CM | POA: Diagnosis not present

## 2016-09-06 DIAGNOSIS — R109 Unspecified abdominal pain: Secondary | ICD-10-CM | POA: Diagnosis not present

## 2016-09-06 DIAGNOSIS — S2232XA Fracture of one rib, left side, initial encounter for closed fracture: Secondary | ICD-10-CM | POA: Diagnosis present

## 2016-09-06 DIAGNOSIS — Z9842 Cataract extraction status, left eye: Secondary | ICD-10-CM | POA: Diagnosis not present

## 2016-09-06 DIAGNOSIS — W108XXA Fall (on) (from) other stairs and steps, initial encounter: Secondary | ICD-10-CM | POA: Diagnosis not present

## 2016-09-06 DIAGNOSIS — I4891 Unspecified atrial fibrillation: Secondary | ICD-10-CM | POA: Diagnosis not present

## 2016-09-06 DIAGNOSIS — Z8719 Personal history of other diseases of the digestive system: Secondary | ICD-10-CM

## 2016-09-06 DIAGNOSIS — Z9889 Other specified postprocedural states: Secondary | ICD-10-CM

## 2016-09-06 DIAGNOSIS — S0993XA Unspecified injury of face, initial encounter: Secondary | ICD-10-CM

## 2016-09-06 DIAGNOSIS — W109XXA Fall (on) (from) unspecified stairs and steps, initial encounter: Secondary | ICD-10-CM | POA: Diagnosis present

## 2016-09-06 DIAGNOSIS — I7101 Dissection of thoracic aorta: Secondary | ICD-10-CM | POA: Diagnosis not present

## 2016-09-06 DIAGNOSIS — Z66 Do not resuscitate: Secondary | ICD-10-CM | POA: Diagnosis present

## 2016-09-06 DIAGNOSIS — Z85828 Personal history of other malignant neoplasm of skin: Secondary | ICD-10-CM | POA: Diagnosis not present

## 2016-09-06 DIAGNOSIS — S0091XA Abrasion of unspecified part of head, initial encounter: Secondary | ICD-10-CM | POA: Diagnosis not present

## 2016-09-06 DIAGNOSIS — Z888 Allergy status to other drugs, medicaments and biological substances status: Secondary | ICD-10-CM

## 2016-09-06 DIAGNOSIS — S00431A Contusion of right ear, initial encounter: Secondary | ICD-10-CM | POA: Diagnosis present

## 2016-09-06 DIAGNOSIS — S0291XA Unspecified fracture of skull, initial encounter for closed fracture: Secondary | ICD-10-CM | POA: Diagnosis not present

## 2016-09-06 DIAGNOSIS — I5042 Chronic combined systolic (congestive) and diastolic (congestive) heart failure: Secondary | ICD-10-CM | POA: Diagnosis present

## 2016-09-06 DIAGNOSIS — S0280XA Fracture of other specified skull and facial bones, unspecified side, initial encounter for closed fracture: Secondary | ICD-10-CM | POA: Diagnosis not present

## 2016-09-06 DIAGNOSIS — S065XAA Traumatic subdural hemorrhage with loss of consciousness status unknown, initial encounter: Secondary | ICD-10-CM

## 2016-09-06 DIAGNOSIS — Z8049 Family history of malignant neoplasm of other genital organs: Secondary | ICD-10-CM | POA: Diagnosis not present

## 2016-09-06 DIAGNOSIS — H9190 Unspecified hearing loss, unspecified ear: Secondary | ICD-10-CM | POA: Diagnosis present

## 2016-09-06 DIAGNOSIS — S0240FA Zygomatic fracture, left side, initial encounter for closed fracture: Secondary | ICD-10-CM | POA: Diagnosis present

## 2016-09-06 DIAGNOSIS — S12600A Unspecified displaced fracture of seventh cervical vertebra, initial encounter for closed fracture: Secondary | ICD-10-CM | POA: Diagnosis not present

## 2016-09-06 DIAGNOSIS — R079 Chest pain, unspecified: Secondary | ICD-10-CM | POA: Diagnosis not present

## 2016-09-06 DIAGNOSIS — I10 Essential (primary) hypertension: Secondary | ICD-10-CM | POA: Diagnosis not present

## 2016-09-06 DIAGNOSIS — Y92009 Unspecified place in unspecified non-institutional (private) residence as the place of occurrence of the external cause: Secondary | ICD-10-CM

## 2016-09-06 DIAGNOSIS — S066X0A Traumatic subarachnoid hemorrhage without loss of consciousness, initial encounter: Secondary | ICD-10-CM | POA: Diagnosis not present

## 2016-09-06 DIAGNOSIS — I7100 Dissection of unspecified site of aorta: Secondary | ICD-10-CM | POA: Diagnosis present

## 2016-09-06 DIAGNOSIS — Z8249 Family history of ischemic heart disease and other diseases of the circulatory system: Secondary | ICD-10-CM

## 2016-09-06 DIAGNOSIS — S065X0A Traumatic subdural hemorrhage without loss of consciousness, initial encounter: Secondary | ICD-10-CM | POA: Diagnosis not present

## 2016-09-06 DIAGNOSIS — I4892 Unspecified atrial flutter: Secondary | ICD-10-CM | POA: Diagnosis not present

## 2016-09-06 DIAGNOSIS — S0282XB Fracture of other specified skull and facial bones, left side, initial encounter for open fracture: Secondary | ICD-10-CM | POA: Diagnosis not present

## 2016-09-06 DIAGNOSIS — Z09 Encounter for follow-up examination after completed treatment for conditions other than malignant neoplasm: Secondary | ICD-10-CM

## 2016-09-06 DIAGNOSIS — S299XXA Unspecified injury of thorax, initial encounter: Secondary | ICD-10-CM | POA: Diagnosis not present

## 2016-09-06 DIAGNOSIS — S14102A Unspecified injury at C2 level of cervical spinal cord, initial encounter: Secondary | ICD-10-CM | POA: Diagnosis present

## 2016-09-06 DIAGNOSIS — Y93I9 Activity, other involving external motion: Secondary | ICD-10-CM | POA: Diagnosis not present

## 2016-09-06 DIAGNOSIS — I7102 Dissection of abdominal aorta: Secondary | ICD-10-CM | POA: Diagnosis not present

## 2016-09-06 DIAGNOSIS — S0292XA Unspecified fracture of facial bones, initial encounter for closed fracture: Secondary | ICD-10-CM | POA: Diagnosis present

## 2016-09-06 DIAGNOSIS — Z7189 Other specified counseling: Secondary | ICD-10-CM | POA: Diagnosis not present

## 2016-09-06 DIAGNOSIS — Z87891 Personal history of nicotine dependence: Secondary | ICD-10-CM

## 2016-09-06 DIAGNOSIS — I609 Nontraumatic subarachnoid hemorrhage, unspecified: Secondary | ICD-10-CM | POA: Diagnosis not present

## 2016-09-06 DIAGNOSIS — S12191A Other nondisplaced fracture of second cervical vertebra, initial encounter for closed fracture: Secondary | ICD-10-CM | POA: Diagnosis not present

## 2016-09-06 DIAGNOSIS — S06899A Other specified intracranial injury with loss of consciousness of unspecified duration, initial encounter: Secondary | ICD-10-CM | POA: Diagnosis not present

## 2016-09-06 HISTORY — DX: Dissection of unspecified site of aorta: I71.00

## 2016-09-06 LAB — COMPREHENSIVE METABOLIC PANEL
ALT: 43 U/L (ref 17–63)
AST: 48 U/L — AB (ref 15–41)
Albumin: 3.9 g/dL (ref 3.5–5.0)
Alkaline Phosphatase: 116 U/L (ref 38–126)
Anion gap: 13 (ref 5–15)
BUN: 17 mg/dL (ref 6–20)
CO2: 21 mmol/L — ABNORMAL LOW (ref 22–32)
CREATININE: 0.99 mg/dL (ref 0.61–1.24)
Calcium: 9.6 mg/dL (ref 8.9–10.3)
Chloride: 104 mmol/L (ref 101–111)
GFR calc Af Amer: >60 mL/min (ref >60)
Glucose, Bld: 120 mg/dL — ABNORMAL HIGH (ref 65–99)
Potassium: 3.4 mmol/L — ABNORMAL LOW (ref 3.5–5.1)
Sodium: 138 mmol/L (ref 135–145)
TOTAL PROTEIN: 7.2 g/dL (ref 6.5–8.1)
Total Bilirubin: 0.6 mg/dL (ref 0.3–1.2)

## 2016-09-06 LAB — URINALYSIS, ROUTINE W REFLEX MICROSCOPIC
BACTERIA UA: NONE SEEN
Bilirubin Urine: NEGATIVE
GLUCOSE, UA: 50 mg/dL — AB
Ketones, ur: 5 mg/dL — AB
Leukocytes, UA: NEGATIVE
NITRITE: NEGATIVE
Protein, ur: NEGATIVE mg/dL
SPECIFIC GRAVITY, URINE: 1.04 — AB (ref 1.005–1.030)
Squamous Epithelial / LPF: NONE SEEN
pH: 5 (ref 5.0–8.0)

## 2016-09-06 LAB — CBC
HCT: 44 % (ref 39.0–52.0)
Hemoglobin: 14.8 g/dL (ref 13.0–17.0)
MCH: 31.8 pg (ref 26.0–34.0)
MCHC: 33.6 g/dL (ref 30.0–36.0)
MCV: 94.6 fL (ref 78.0–100.0)
PLATELETS: 251 10*3/uL (ref 150–400)
RBC: 4.65 MIL/uL (ref 4.22–5.81)
RDW: 13.2 % (ref 11.5–15.5)
WBC: 15.1 10*3/uL — AB (ref 4.0–10.5)

## 2016-09-06 LAB — I-STAT CHEM 8, ED
BUN: 21 mg/dL — AB (ref 6–20)
CHLORIDE: 103 mmol/L (ref 101–111)
CREATININE: 1 mg/dL (ref 0.61–1.24)
Calcium, Ion: 1.13 mmol/L — ABNORMAL LOW (ref 1.15–1.40)
Glucose, Bld: 122 mg/dL — ABNORMAL HIGH (ref 65–99)
HEMATOCRIT: 44 % (ref 39.0–52.0)
Hemoglobin: 15 g/dL (ref 13.0–17.0)
POTASSIUM: 3.2 mmol/L — AB (ref 3.5–5.1)
Sodium: 140 mmol/L (ref 135–145)
TCO2: 25 mmol/L (ref 0–100)

## 2016-09-06 LAB — PROTIME-INR
INR: 1.09
PROTHROMBIN TIME: 14.2 s (ref 11.4–15.2)

## 2016-09-06 LAB — I-STAT CG4 LACTIC ACID, ED: LACTIC ACID, VENOUS: 4.69 mmol/L — AB (ref 0.5–1.9)

## 2016-09-06 LAB — ETHANOL: Alcohol, Ethyl (B): <5 mg/dL (ref <5)

## 2016-09-06 MED ORDER — IOPAMIDOL (ISOVUE-300) INJECTION 61%
INTRAVENOUS | Status: AC
  Administered 2016-09-06: 100 mL
  Filled 2016-09-06: qty 100

## 2016-09-06 MED ORDER — DIPHENHYDRAMINE HCL 50 MG/ML IJ SOLN
25.0000 mg | Freq: Once | INTRAMUSCULAR | Status: AC
  Administered 2016-09-06: 25 mg via INTRAVENOUS
  Filled 2016-09-06: qty 1

## 2016-09-06 MED ORDER — LABETALOL HCL 5 MG/ML IV SOLN
5.0000 mg | INTRAVENOUS | Status: DC | PRN
  Administered 2016-09-06 – 2016-09-07 (×8): 5 mg via INTRAVENOUS
  Filled 2016-09-06 (×5): qty 4

## 2016-09-06 MED ORDER — ONDANSETRON HCL 4 MG/2ML IJ SOLN: 4.0000 mg | Freq: Four times a day (QID) | INTRAMUSCULAR | Status: DC | PRN

## 2016-09-06 MED ORDER — SODIUM CHLORIDE 0.9 % IV BOLUS (SEPSIS)
1000.0000 mL | Freq: Once | INTRAVENOUS | Status: AC
  Administered 2016-09-06: 1000 mL via INTRAVENOUS

## 2016-09-06 MED ORDER — MIDAZOLAM HCL 2 MG/2ML IJ SOLN
1.0000 mg | INTRAMUSCULAR | Status: DC | PRN
  Administered 2016-09-07 (×2): 0.5 mg via INTRAVENOUS
  Administered 2016-09-07: 1 mg via INTRAVENOUS
  Filled 2016-09-06 (×3): qty 2

## 2016-09-06 MED ORDER — MORPHINE SULFATE (PF) 2 MG/ML IV SOLN
1.0000 mg | INTRAVENOUS | Status: DC | PRN
  Administered 2016-09-06 – 2016-09-08 (×7): 1 mg via INTRAVENOUS
  Filled 2016-09-06 (×6): qty 1

## 2016-09-06 MED ORDER — ONDANSETRON HCL 4 MG PO TABS
4.0000 mg | ORAL_TABLET | Freq: Four times a day (QID) | ORAL | Status: DC | PRN
Start: 2016-09-06 — End: 2016-09-08

## 2016-09-06 MED ORDER — DIPHENHYDRAMINE HCL 50 MG/ML IJ SOLN
INTRAMUSCULAR | Status: AC
  Administered 2016-09-06: 25 mg via INTRAVENOUS
  Filled 2016-09-06: qty 1

## 2016-09-06 MED ORDER — FENTANYL CITRATE (PF) 100 MCG/2ML IJ SOLN
25.0000 ug | Freq: Once | INTRAMUSCULAR | Status: AC
  Administered 2016-09-06: 25 ug via INTRAVENOUS
  Filled 2016-09-06: qty 2

## 2016-09-06 MED ORDER — ORAL CARE MOUTH RINSE
15.0000 mL | Freq: Two times a day (BID) | OROMUCOSAL | Status: DC
  Administered 2016-09-06 – 2016-09-07 (×3): 15 mL via OROMUCOSAL

## 2016-09-06 MED ORDER — SODIUM CHLORIDE 0.9 % IV SOLN
INTRAVENOUS | Status: DC
  Administered 2016-09-06 – 2016-09-08 (×3): via INTRAVENOUS

## 2016-09-06 MED ORDER — PROTHROMBIN COMPLEX CONC HUMAN 500 UNITS IV KIT
5000.0000 [IU] | PACK | Status: AC
  Administered 2016-09-06: 5000 [IU] via INTRAVENOUS
  Filled 2016-09-06: qty 200

## 2016-09-06 MED ORDER — SODIUM CHLORIDE 0.9 % IV SOLN
Freq: Once | INTRAVENOUS | Status: AC
  Administered 2016-09-06: 20:00:00 via INTRAVENOUS

## 2016-09-06 NOTE — ED Notes (Signed)
Patient with red blotches on trunk and arms.  Patient with itchiness all over trunk and arms.  Patient medicated per Dr Myrene Buddy.

## 2016-09-06 NOTE — ED Notes (Signed)
Dr Tenna Delaine at bedside.

## 2016-09-06 NOTE — Progress Notes (Signed)
Orthopedic Tech Progress Note Patient Details:  Antonio [name] May 07, 1929 [id number] Level 2 trauma ortho visit. Patient ID: Antonio [name], male   DOB: May 16, 1929, 83 y.o.   MRN: [id number]   Braulio Bosch 09/05/2016, 7:25 PM

## 2016-09-06 NOTE — ED Provider Notes (Signed)
[facility] DEPT Provider Note   CSN: [id number] Arrival date & time: 09/15/2016  1828     History   Chief Complaint Chief Complaint  Patient presents with  . Fall    HPI Antonio [name] is a 83 y.o. male.  HPI 83 year old male with a history of A. fib on eliquis presenting after a fall. Per EMS he was carrying groceries up the stairs admitted approximately halfway up the flight of stairs and lost his balance and fell backwards. The floor was tile and there was also a metal pole at the bottom of the stairs that he hit his head on. He complains mostly of neck pain and headache. He denies numbness or weakness. He denies chest pain, shortness of breath, abdominal pain. Per EMS he has been increasingly more lethargic and disoriented. His family states that he is normally alert and oriented and active. Due to his lethargy it is difficult to get an adequate history from him.  Past Medical History:  Diagnosis Date  . Anemia   . Aortic dissection ([place]) 08/30/2016  . Appendicitis    SBP 7/09 ruputre w/ RLQ abscess 03/16/08  . Atrial fibrillation ([place])   . Basal cell carcinoma of nose 2016   Right side nasal tip, [name]  . BPH (benign prostatic hyperplasia) 06/29/2011  . Cardiomyopathy    secondary to Tachycardia. A-Acute on chronic admit for CHF 6/08. B- CHF coupled with atrial fib RVR electrophysiology study 09/27/07.  A- Multiple flutters and Atrial fib- no ablation performed b- ibutilide cardoversion c- amiordarone load- d/c in sinus rhyrthm d- coumadin anticoagulation- stopped at time of GI bleed 11/09  . Glaucoma   . History of blood transfusion 2009   "related to rupture in esophagus" (04/13/2016)  . Hyperlipidemia   . Hypertension   . Knee fracture, right 08/2014   "no OR" (04/13/2016)  . Lung nodules    [name] 04/13/2016  . Mallory - Weiss tear    esophagitis, 6 unit hemorrhage- EPI injections   . Small bowel obstruction     Patient Active Problem List   Diagnosis Date Noted   . Subdural hematoma, post-traumatic ([place]) 09/15/2016  . Aortic dissection ([place]) 08/24/2016  . Right knee pain 07/17/2016  . Persistent atrial fibrillation ([place]) 04/11/2016  . Encounter for therapeutic drug monitoring 02/15/2016  . PCP NOTES >>>>> 07/08/2015  . Cough 06/29/2012  . Annual physical exam 06/29/2011  . BPH (benign prostatic hyperplasia) 06/29/2011  . Abnormal TSH 06/28/2010  . HYPERLIPIDEMIA 12/30/2008  . Skin lesion 12/30/2008  . Hypertension 10/08/2007  . 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 ATRIAL FIBRILLATION 10/08/2007  . GLAUCOMA NOS 02/06/2007    Past Surgical History:  Procedure Laterality Date  . APPENDECTOMY  02/2008   exploratory, ruptured appendix, RLQ abscess  . BASAL CELL CARCINOMA EXCISION     nose  . CATARACT EXTRACTION W/ INTRAOCULAR LENS  IMPLANT, BILATERAL Bilateral   . COLONOSCOPY    . COLONOSCOPY W/ BIOPSIES AND POLYPECTOMY  ~ 2015  . EP study  09/27/07   see pmh  . EXPLORATORY LAPAROTOMY  02/2008  . HEMORRHOID SURGERY    . TEE WITHOUT CARDIOVERSION N/A 02/08/2016   Procedure: TRANSESOPHAGEAL ECHOCARDIOGRAM (TEE);  Surgeon: Fay Records, MD;  Location: [facility] ENDOSCOPY;  Service: Cardiovascular;  Laterality: N/A;  . TEE WITHOUT CARDIOVERSION N/A 04/11/2016   Procedure: TRANSESOPHAGEAL ECHOCARDIOGRAM (TEE);  Surgeon: Josue Hector, MD;  Location: [facility];  Service: Cardiovascular;  Laterality: N/A;  . TEE/DCCV  02/07/07  Home Medications    Prior to Admission medications   Medication Sig Start Date End Date Taking? Authorizing Provider  apixaban (ELIQUIS) 5 MG TABS tablet Take 5 mg by mouth 2 (two) times daily.   Yes Historical Provider, MD  diltiazem (CARDIZEM CD) 120 MG 24 hr capsule Take 1 capsule (120 mg total) by mouth daily. 04/15/16 08/24/2016 Yes Sherran Needs, NP  dofetilide (TIKOSYN) 125 MCG capsule Take 375 mcg by mouth 2 (two) times daily.    Yes Historical Provider, MD  furosemide (LASIX) 20 MG tablet Take one tablet  (20 mg) by mouth once daily as needed for swelling/ shortness of breath 05/31/16  Yes Deboraha Sprang, MD  metoprolol (LOPRESSOR) 50 MG tablet Take 50 mg by mouth 2 (two) times daily.   Yes Historical Provider, MD  Multiple Vitamins-Minerals (CENTRUM) tablet Take 1 tablet by mouth daily.     Yes Historical Provider, MD  UNKNOWN TO PATIENT Obtain new eyedrop recently (call pharmacy, please)   Yes Historical Provider, MD    Family History Family History  Problem Relation Age of Onset  . Uterine cancer Mother   . Heart attack Maternal Uncle   . Heart disease Maternal Uncle   . Colon cancer Neg Hx   . Prostate cancer Neg Hx   . Diabetes Neg Hx     Social History Social History  Substance Use Topics  . Smoking status: Former Smoker    Packs/day: 1.00    Years: 23.00    Quit date: 06/30/1971  . Smokeless tobacco: Never Used  . Alcohol use No     Allergies   Amiodarone; Fentanyl; and Kcentra [prothrombin complex conc human]   Review of Systems Review of Systems  Unable to perform ROS: Mental status change     Physical Exam Updated Vital Signs BP (!) 157/88 (BP Location: Left Arm)   Pulse 93   Temp 97.5 F (36.4 C) (Axillary)   Resp (!) 28   Ht 6\' 4"  (1.93 m)   Wt 112.9 kg   SpO2 93%   BMI 30.30 kg/m   Physical Exam  Constitutional: He appears well-developed and well-nourished. He appears lethargic. He appears ill.  HENT:  Head: Normocephalic. Head is with abrasion (to L parietal/occipital region) and with contusion.  Left Ear: No hemotympanum.  No obvious skull deformity or depression. R auricular hematoma. R ext auditory canal occluded with cerumen.  Eyes: EOM are normal. Pupils are equal, round, and reactive to light.  Neck: Trachea normal and phonation normal. Spinous process tenderness and muscular tenderness present.  Cardiovascular: Normal rate, S1 normal, S2 normal, normal heart sounds, intact distal pulses and normal pulses.  An irregularly irregular rhythm  present.  No murmur heard. Pulmonary/Chest: Effort normal. He has decreased breath sounds.  Abdominal: Soft. He exhibits no distension. There is no tenderness.  Musculoskeletal: Normal range of motion. He exhibits no tenderness or deformity.  Quarter size skin tear to posterior L and R hand, hemostatic.  Neurological: He has normal strength and normal reflexes. He appears lethargic. No cranial nerve deficit or sensory deficit.  Skin: Skin is warm. Capillary refill takes less than 2 seconds.  Nursing note and vitals reviewed.    ED Treatments / Results  Labs (all labs ordered are listed, but only abnormal results are displayed) Labs Reviewed  COMPREHENSIVE METABOLIC PANEL - Abnormal; Notable for the following:       Result Value   Potassium 3.4 (*)    CO2 21 (*)  Glucose, Bld 120 (*)    AST 48 (*)    All other components within normal limits  CBC - Abnormal; Notable for the following:    WBC 15.1 (*)    All other components within normal limits  URINALYSIS, ROUTINE W REFLEX MICROSCOPIC - Abnormal; Notable for the following:    Specific Gravity, Urine 1.040 (*)    Glucose, UA 50 (*)    Hgb urine dipstick SMALL (*)    Ketones, ur 5 (*)    All other components within normal limits  I-STAT CHEM 8, ED - Abnormal; Notable for the following:    Potassium 3.2 (*)    BUN 21 (*)    Glucose, Bld 122 (*)    Calcium, Ion 1.13 (*)    All other components within normal limits  I-STAT CG4 LACTIC ACID, ED - Abnormal; Notable for the following:    Lactic Acid, Venous 4.69 (*)    All other components within normal limits  ETHANOL  PROTIME-INR  CBC  BASIC METABOLIC PANEL    EKG  EKG Interpretation None       Radiology Ct Head Wo Contrast  Result Date: 09/04/2016 CLINICAL DATA:  83 year old male status post fall backwards while caring groceries up stairs. On anti coagulation for a fib. Lethargic and confused on arrival. Initial encounter. EXAM: CT HEAD WITHOUT CONTRAST CT  CERVICAL SPINE WITHOUT CONTRAST TECHNIQUE: Multidetector CT imaging of the head and cervical spine was performed following the standard protocol without intravenous contrast. Multiplanar CT image reconstructions of the cervical spine were also generated. COMPARISON:  Cervical spine radiographs 07/08/2016. FINDINGS: CT HEAD FINDINGS Brain: Small to moderate volume subarachnoid hemorrhage throughout the left convexity. There is a 5 mm hyperdense extra-axial hemorrhage along the left lateral convexity which could be subdural or a small epidural hematoma. There is a contralateral right parafalcine and tentorial subdural hematoma measuring up to 10 mm in thickness, predominantly posterior. This right side subdural hematoma also wraps around the inferior and lateral right hemisphere measuring up to 5 mm in thickness laterally and superiorly. Superimposed trace right vertex subarachnoid hemorrhage. No basilar cistern subarachnoid hemorrhage. No intraventricular hemorrhage or ventriculomegaly. No midline shift at this time. Basilar cisterns remain patent. No posterior fossa hemorrhage. No cortically based acute infarct identified. Vascular: Calcified atherosclerosis at the skull base. Skull: Osteopenia. Non depressed left sphenoid wing and squamous ule anterior temporal bone skull fracture. This tracks cephalad and is mildly comminuted toward the vertex. The fracture appears to terminated along the lateral aspect of the left middle cranial fossa. There is a nearby a nondisplaced left zygomatic arch fracture. No more central skullbase fracture is identified. However, there is also a nondisplaced right zygomatic arch fracture as well as a mildly displaced right lateral orbital wall fracture (series 202 images 13 in 22). There might be a trace amount of right intraorbital contusion (image 25). No other orbital wall fracture. Sinuses/Orbits: Small fluid levels in the left sphenoid sinus which appear possibly hyperdense such as  related to hemorrhage. Similar fluid level in the posterior left ethmoid air cell. Small lower density fluid level in the right maxillary sinus with alveolar recess mucosal thickening associated. Mild opacification of the and appear ear left mastoid air cells. The left tympanic cavity and remaining mastoids are clear. The right tympanic cavity and right mastoids are clear. Other: Large broad-based left scalp hematoma measuring up to 12 mm in thickness. Other scalp soft tissues appear normal. Negative orbits soft tissues. Negative visualized deep soft  tissue spaces of the face except for a small volume of gas in the left masticator space. CT CERVICAL SPINE FINDINGS Osteopenia. Occipital condyles -C1 alignment is preserved. 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 There is associated disruption of the right C2-C3 facet joint, with an avulsed fragment off of the posterior C3 facet (sagittal image 29). There is subsequent mild retrolisthesis of the right side body of C2 on C3. There is associated epidural hemorrhage. There is associated C2 level spinal stenosis which is probably mild. The C3 body and pedicles are intact. There is a mildly displaced C3 spinous process fracture in addition to the small avulsion from the posterior right facet. The C4 vertebra is intact.  The C5 vertebra is intact. The C6 vertebra appears intact. Possible mild compression fracture of the C7 superior endplate. There are questionable nondisplaced fractures through the bilateral C7 superior articulating facet - but this is probably artifact due to osteopenia. There is mild to moderate compression of the T3  vertebral body. There is a minimally displaced fracture of the posterior left first rib. There is a comminuted fracture of the right mid clavicle. The other visualized upper thoracic levels appear grossly intact. Negative lung apices.  No cervical prevertebral fluid. IMPRESSION: CT HEAD: 1. Comminuted non-depressed left frontotemporal skull fracture. Bilateral zygomatic arch fractures. Right lateral orbital wall fracture. 2. Five mm left lateral epidural versus subdural hematoma. Right subdural hematoma with 10 mm parafalcine and 5 mm peripheral components. 3. Left greater than right subarachnoid hemorrhage. 4. No midline shift at this time. No intraventricular hemorrhage or ventriculomegaly. No hemorrhagic cerebral contusion identified at this time. 5. Small volume of hemorrhage in the sphenoid sinuses but no central skullbase fracture identified. CT CERVICAL SPINE: 1. Severe C2 fracture is impacted, comminuted, and displaced. Associated disruption of the right C2-C3 facet joint. Subsequent C2 level epidural hematoma and multifactorial spinal stenosis (probably mild at this time). 2. Small avulsion fractures of the posterior right C3 facet and the C3 spinous process. 3. Suspected mild compression fracture of the C7 superior endplate. Questionable nondisplaced bilateral C7 superior articulating facet fractures - but favor artifact due to osteopenia instead. 4. Age indeterminate compression of the T3 vertebral body. 5. Mildly displaced posterior left first rib fracture. Critical Value/emergent results were called by telephone at the time of interpretation on 09/20/2016 at 1954 hours to Dr. Ellender Hose , who verbally acknowledged these results. Electronically Signed   By: Genevie Ann M.D.   On: 08/24/2016 20:09   Ct Chest W Contrast  Result Date: 09/02/2016 CLINICAL DATA:  Pain after fall EXAM: CT CHEST, ABDOMEN, AND PELVIS WITH CONTRAST TECHNIQUE: Multidetector CT imaging of the chest, abdomen and pelvis was performed  following the standard protocol during bolus administration of intravenous contrast. CONTRAST:  140mL ISOVUE-300 IOPAMIDOL (ISOVUE-300) INJECTION 61% COMPARISON:  04/13/2016 and 03/16/2008 CT abdomen and pelvis FINDINGS: CT CHEST FINDINGS Cardiovascular: Stable cardiomegaly without pericardial effusion. 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 What is indeterminate unfortunately is a whether there is an ascending  aortic component due to motion. Vascular surgery consultation is recommended. Would have a low threshold for repeat imaging, with cardiac gating that may help solve this dilemma. Much of the dissection from the arch distal is thrombosed, with probable fenestration causing opacification on both sides of the dissection along the descending aorta. No mediastinal hematoma. No aneurysm. No large central pulmonary embolus. Coronary arteriosclerosis. Mediastinum/Nodes: No adenopathy. Normal appearance of the thoracic esophagus. Trachea is patent. Lungs/Pleura: Small nodular densities measuring up to 7 mm are identified predominantly on the right and in the upper lobe. Study is somewhat limited to motion artifacts. Some the nodular densities may represent branch points for pulmonary vessels especially in the lower right upper lobe adjacent to the minor fissure. Dependent atelectasis noted. Musculoskeletal: Multiple chronic thoracolumbar compression fractures, with slight loss of height at T3 since prior exam but stable along the remainder to at least the L1 level. Chronic fractures involve T3 and T6 through L1. Go CT ABDOMEN PELVIS FINDINGS Hepatobiliary: 3 cm indeterminate hypodense lesion of the right hepatic lobe with Hounsfield units not definitive for simple cyst but given lack of significant change from 2009 is likely benign  finding possibly complex cyst. No biliary dilatation. No solid enhancing mass lesions seen. Pancreas: No pancreatic mass or ductal dilatation. Spleen: No splenomegaly or focal mass.  No splenic laceration. Adrenals/Urinary Tract: Unremarkable adrenal glands. No obstructive uropathy or mass. Physiologic distention of the bladder. Stomach/Bowel: No acute inflammation or bowel obstruction. Appendectomy by report. Vascular/Lymphatic: Aortic and branch vessel atherosclerosis. No lymphadenopathy. Reproductive: Enlarged prostate with peripheral zone calcifications of the prostate measures approximately 6.1 x 5.2 x 5.2 cm (volume = 86 cm^3) Other: Mild diastases of the rectus muscles. Musculoskeletal: Chronic L3 through L5 compression fractures. Stable L1 moderate compression fractures since prior chest CT dated 04/13/2016. Mild superior endplate compression of L2 since 2009 but incompletely imaged on recent CT. IMPRESSION: 1. 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 What is indeterminate unfortunately is a whether there is an ascending aortic component due to motion. Vascular surgery consultation is recommended. Would have a low threshold for repeat imaging, with cardiac gating that may help solve this dilemma. Much of the dissection from the arch distal is thrombosed, with probable fenestration causing opacification on both sides of the dissection along the descending aorta. Critical Value/emergent results were called by telephone at the time of interpretation on 09/07/2016 at 8:51 pm to Dr. Ovid Curd Zyann Mabry , who verbally acknowledged these results. 2. Relatively stable nodular opacities within the lungs more so on the right since recent comparison study, the largest measuring up to 7 mm. Given relative stability would recommend follow-up in  18-24 months from the initial CT dated 04/13/2016 per Fleischner Society recommendations. 3. Multiple thoracolumbar compression fractures are again noted slightly more accentuated at T3 since prior exam. An L2 compression is also noted which is new since the 2009 CT. 4. Approximately 3 cm right hepatic hypodensity grossly stable since 2009 and likely benign finding not definitively a simple cyst by Hounsfield unit criteria. Electronically Signed   By: Ashley Royalty M.D.   On: 09/21/2016 20:57   Ct Cervical Spine Wo Contrast  Result Date: 09/05/2016 CLINICAL DATA:  83 year old male status post fall backwards while caring groceries up stairs. On anti coagulation for a fib. Lethargic and confused on arrival.  Initial encounter. EXAM: CT HEAD WITHOUT CONTRAST CT CERVICAL SPINE WITHOUT CONTRAST TECHNIQUE: Multidetector CT imaging of the head and cervical spine was performed following the standard protocol without intravenous contrast. Multiplanar CT image reconstructions of the cervical spine were also generated. COMPARISON:  Cervical spine radiographs 07/08/2016. FINDINGS: CT HEAD FINDINGS Brain: Small to moderate volume subarachnoid hemorrhage throughout the left convexity. There is a 5 mm hyperdense extra-axial hemorrhage along the left lateral convexity which could be subdural or a small epidural hematoma. There is a contralateral right parafalcine and tentorial subdural hematoma measuring up to 10 mm in thickness, predominantly posterior. This right side subdural hematoma also wraps around the inferior and lateral right hemisphere measuring up to 5 mm in thickness laterally and superiorly. Superimposed trace right vertex subarachnoid hemorrhage. No basilar cistern subarachnoid hemorrhage. No intraventricular hemorrhage or ventriculomegaly. No midline shift at this time. Basilar cisterns remain patent. No posterior fossa hemorrhage. No cortically based acute infarct identified. Vascular: Calcified atherosclerosis at  the skull base. Skull: Osteopenia. Non depressed left sphenoid wing and squamous ule anterior temporal bone skull fracture. This tracks cephalad and is mildly comminuted toward the vertex. The fracture appears to terminated along the lateral aspect of the left middle cranial fossa. There is a nearby a nondisplaced left zygomatic arch fracture. No more central skullbase fracture is identified. However, there is also a nondisplaced right zygomatic arch fracture as well as a mildly displaced right lateral orbital wall fracture (series 202 images 13 in 22). There might be a trace amount of right intraorbital contusion (image 25). No other orbital wall fracture. Sinuses/Orbits: Small fluid levels in the left sphenoid sinus which appear possibly hyperdense such as related to hemorrhage. Similar fluid level in the posterior left ethmoid air cell. Small lower density fluid level in the right maxillary sinus with alveolar recess mucosal thickening associated. Mild opacification of the and appear ear left mastoid air cells. The left tympanic cavity and remaining mastoids are clear. The right tympanic cavity and right mastoids are clear. Other: Large broad-based left scalp hematoma measuring up to 12 mm in thickness. Other scalp soft tissues appear normal. Negative orbits soft tissues. Negative visualized deep soft tissue spaces of the face except for a small volume of gas in the left masticator space. CT CERVICAL SPINE FINDINGS Osteopenia. Occipital condyles -C1 alignment is preserved. 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 There is associated disruption of the right C2-C3 facet joint,  with an avulsed fragment off of the posterior C3 facet (sagittal image 29). There is subsequent mild retrolisthesis of the right side body of C2 on C3. There is associated epidural hemorrhage. There is associated C2 level spinal stenosis which is probably mild. The C3 body and pedicles are intact. There is a mildly displaced C3 spinous process fracture in addition to the small avulsion from the posterior right facet. The C4 vertebra is intact.  The C5 vertebra is intact. The C6 vertebra appears intact. Possible mild compression fracture of the C7 superior endplate. There are questionable nondisplaced fractures through the bilateral C7 superior articulating facet - but this is probably artifact due to osteopenia. There is mild to moderate compression of the T3 vertebral body. There is  a minimally displaced fracture of the posterior left first rib. There is a comminuted fracture of the right mid clavicle. The other visualized upper thoracic levels appear grossly intact. Negative lung apices.  No cervical prevertebral fluid. IMPRESSION: CT HEAD: 1. Comminuted non-depressed left frontotemporal skull fracture. Bilateral zygomatic arch fractures. Right lateral orbital wall fracture. 2. Five mm left lateral epidural versus subdural hematoma. Right subdural hematoma with 10 mm parafalcine and 5 mm peripheral components. 3. Left greater than right subarachnoid hemorrhage. 4. No midline shift at this time. No intraventricular hemorrhage or ventriculomegaly. No hemorrhagic cerebral contusion identified at this time. 5. Small volume of hemorrhage in the sphenoid sinuses but no central skullbase fracture identified. CT CERVICAL SPINE: 1. Severe C2 fracture is impacted, comminuted, and displaced. Associated disruption of the right C2-C3 facet joint. Subsequent C2 level epidural hematoma and multifactorial spinal stenosis (probably mild at this time). 2. Small avulsion fractures of the posterior right C3 facet and the C3 spinous  process. 3. Suspected mild compression fracture of the C7 superior endplate. Questionable nondisplaced bilateral C7 superior articulating facet fractures - but favor artifact due to osteopenia instead. 4. Age indeterminate compression of the T3 vertebral body. 5. Mildly displaced posterior left first rib fracture. Critical Value/emergent results were called by telephone at the time of interpretation on 09/17/2016 at 1954 hours to Dr. Ellender Hose , who verbally acknowledged these results. Electronically Signed   By: Genevie Ann M.D.   On: 09/13/2016 20:09   Ct Abdomen Pelvis W Contrast  Result Date: 09/05/2016 CLINICAL DATA:  Pain after fall EXAM: CT CHEST, ABDOMEN, AND PELVIS WITH CONTRAST TECHNIQUE: Multidetector CT imaging of the chest, abdomen and pelvis was performed following the standard protocol during bolus administration of intravenous contrast. CONTRAST:  158mL ISOVUE-300 IOPAMIDOL (ISOVUE-300) INJECTION 61% COMPARISON:  04/13/2016 and 03/16/2008 CT abdomen and pelvis FINDINGS: CT CHEST FINDINGS Cardiovascular: Stable cardiomegaly without pericardial effusion. 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 What is indeterminate unfortunately is a whether there is an ascending aortic component due to motion. Vascular surgery consultation is recommended. Would have a low threshold for repeat imaging, with cardiac gating that may help solve this dilemma. Much of the dissection from the arch distal is thrombosed, with probable fenestration causing opacification on both sides of the dissection along the descending aorta. No mediastinal hematoma. No aneurysm. No large central pulmonary embolus. Coronary arteriosclerosis. Mediastinum/Nodes: No adenopathy. Normal appearance of the thoracic esophagus. Trachea is patent. Lungs/Pleura: Small  nodular densities measuring up to 7 mm are identified predominantly on the right and in the upper lobe. Study is somewhat limited to motion artifacts. Some the nodular densities may represent branch points for pulmonary vessels especially in the lower right upper lobe adjacent to the minor fissure. Dependent atelectasis noted. Musculoskeletal: Multiple chronic thoracolumbar compression fractures, with slight loss of height at T3 since prior exam but stable along the remainder to at least the L1 level. Chronic fractures involve T3 and T6 through L1. Go CT ABDOMEN PELVIS FINDINGS Hepatobiliary: 3 cm indeterminate hypodense lesion of the right hepatic lobe with Hounsfield units not definitive for simple cyst but given lack of significant change from 2009 is likely benign finding possibly complex cyst. No biliary dilatation. No solid enhancing mass lesions seen. Pancreas: No pancreatic mass or ductal dilatation. Spleen: No splenomegaly or focal mass.  No splenic laceration. Adrenals/Urinary Tract: Unremarkable adrenal glands. No obstructive uropathy or mass. Physiologic distention of the bladder. Stomach/Bowel: No acute inflammation or bowel obstruction. Appendectomy by report. Vascular/Lymphatic: Aortic and branch vessel atherosclerosis. No lymphadenopathy. Reproductive: Enlarged prostate with peripheral zone calcifications of the prostate measures approximately 6.1 x 5.2 x 5.2 cm (volume = 86 cm^3) Other: Mild diastases of the rectus muscles. Musculoskeletal: Chronic L3 through L5 compression fractures. Stable L1 moderate compression fractures since prior chest CT dated 04/13/2016. Mild superior endplate compression of L2 since 2009 but incompletely imaged on recent CT. IMPRESSION: 1. 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 What is  indeterminate unfortunately is a whether there is an ascending aortic component due to motion. Vascular surgery consultation is recommended. Would have a low threshold for repeat imaging, with cardiac gating that may help solve this dilemma. Much of the dissection from the arch distal is thrombosed, with probable fenestration causing opacification on both sides of the dissection along the descending aorta. Critical Value/emergent results were called by telephone at the time of interpretation on 09/04/2016 at 8:51 pm to Dr. Ovid Curd Latiqua Daloia , who verbally acknowledged these results. 2. Relatively stable nodular opacities within the lungs more so on the right since recent comparison study, the largest measuring up to 7 mm. Given relative stability would recommend follow-up in 18-24 months from the initial CT dated 04/13/2016 per Fleischner Society recommendations. 3. Multiple thoracolumbar compression fractures are again noted slightly more accentuated at T3 since prior exam. An L2 compression is also noted which is new since the 2009 CT. 4. Approximately 3 cm right hepatic hypodensity grossly stable since 2009 and likely benign finding not definitively a simple cyst by Hounsfield unit criteria. Electronically Signed   By: Ashley Royalty M.D.   On: 08/28/2016 20:57    Procedures Procedures (including critical care time)  Medications Ordered in ED Medications  0.9 %  sodium chloride infusion ( Intravenous Started During Downtime 09/05/2016 2210)  morphine 2 MG/ML injection 1 mg (1 mg Intravenous Given 09/19/2016 2259)  ondansetron (ZOFRAN) tablet 4 mg (not administered)    Or  ondansetron (ZOFRAN) injection 4 mg (not administered)  labetalol (NORMODYNE,TRANDATE) injection 5 mg (5 mg Intravenous Given 08/24/2016 2324)  midazolam (VERSED) injection 1 mg (not administered)  MEDLINE mouth rinse (15 mLs Mouth Rinse Given 09/19/2016 2319)  iopamidol (ISOVUE-300) 61 % injection (100 mLs  Contrast Given 09/04/2016 1922)  prothrombin  complex conc human (KCENTRA) IVPB 5,000 Units (5,000 Units Intravenous Given 09/03/2016 2008)  sodium chloride 0.9 % bolus 1,000 mL (0 mLs Intravenous Stopped 08/28/2016 2110)  0.9 %  sodium chloride infusion ( Intravenous New Bag/Given 09/12/2016 2008)  fentaNYL (SUBLIMAZE) injection 25 mcg (25 mcg Intravenous Given 09/21/2016 2046)  diphenhydrAMINE (BENADRYL) injection 25 mg (25 mg Intravenous Given 08/25/2016 2108)     Initial Impression / Assessment and Plan / ED Course  I have reviewed the triage vital signs and the nursing notes.  Pertinent labs & imaging results that were available during my care of the patient were reviewed by me and considered in my medical decision making (see chart for details).  Clinical Course    83 year old male on eliquis presenting after a fall from approximately 7 and he fell backwards and hit his head on the floor and a metal pole. Per EMS he  has had worsening confusion and somnolence. On Exam he is lethargic and difficult to arouse. He has nonfocal neuro exam complains of severe neck pain and headache. Further exam is as above. Trauma labs and scans ordered. Patient placed on 2 L oxygen due to O2 sat low 90s. he is otherwise HDS.  CT is notable for left frontotemporal skull fracture, bilateral zygomatic arch fractures, right lateral orbital wall fracture as well as lateral epidural versus subdural hematoma, bilateral subarachnoid with no evidence of midline shift, severe C2 impacted comminuted and displaced fracture with bilateral C2-C3 facet joint fracture, compression fracture of C7 superior endplate, concern for thoracic aortic dissection when compared to a CT from August. Trauma surgery consulted and at bedside.  Neurosurgery, Dr. Ellene Route, Consulted for head bleed and spinal fx.  Vascular surgery (dr. Scot Dock) consulted for concern of aortic dissection, who then requested CT surgery  consult. CT surgery (dr. Roxy Manns) consulted.  ENT, Dr. Remer Macho, consulted for auricular  hematoma who states it is non-op mangement.  Pt will be admitted to trauma surgery for further eval and management. Pt remained HDS while in the ED.  Final Clinical Impressions(s) / ED Diagnoses   Final diagnoses:  Facial trauma    New Prescriptions Current Discharge Medication List       Wynter Isaacs Mali Zalia Hautala, MD 09/07/16 0006    Duffy Bruce, MD 09/07/16 1306

## 2016-09-06 NOTE — H&P (Signed)
History   Antonio [name] is an 83 y.o. male.   Chief Complaint:  Chief Complaint  Patient presents with  . Fall    HPI 83 yo male with multiple significant medical problems presents after a fall.  He was walking up some stairs carrying groceries when he fell onto his face.  Questionable LOC.  Significant pain to face, head, neck.  Brought in as a non-trauma code - upgraded to level II by EDP.  Patient is anticoagulated on Eliquis for atrial fibrillation.  Cardiology - Caryl Comes.  Significant brain and cervical spine injuries.  We are asked to evaluate and admit the patient.  Past Medical History:  Diagnosis Date  . Anemia   . Appendicitis    SBP 7/09 ruputre w/ RLQ abscess 03/16/08  . Atrial fibrillation ([place])   . Basal cell carcinoma of nose 2016   Right side nasal tip, [name]  . BPH (benign prostatic hyperplasia) 06/29/2011  . Cardiomyopathy    secondary to Tachycardia. A-Acute on chronic admit for CHF 6/08. B- CHF coupled with atrial fib RVR electrophysiology study 09/27/07.  A- Multiple flutters and Atrial fib- no ablation performed b- ibutilide cardoversion c- amiordarone load- d/c in sinus rhyrthm d- coumadin anticoagulation- stopped at time of GI bleed 11/09  . Glaucoma   . History of blood transfusion 2009   "related to rupture in esophagus" (04/13/2016)  . Hyperlipidemia   . Hypertension   . Knee fracture, right 08/2014   "no OR" (04/13/2016)  . Lung nodules    [name] 04/13/2016  . Mallory - Weiss tear    esophagitis, 6 unit hemorrhage- EPI injections   . Small bowel obstruction     Past Surgical History:  Procedure Laterality Date  . APPENDECTOMY  02/2008   exploratory, ruptured appendix, RLQ abscess  . BASAL CELL CARCINOMA EXCISION     nose  . CATARACT EXTRACTION W/ INTRAOCULAR LENS  IMPLANT, BILATERAL Bilateral   . COLONOSCOPY    . COLONOSCOPY W/ BIOPSIES AND POLYPECTOMY  ~ 2015  . EP study  09/27/07   see pmh  . EXPLORATORY LAPAROTOMY  02/2008  . HEMORRHOID SURGERY     . TEE WITHOUT CARDIOVERSION N/A 02/08/2016   Procedure: TRANSESOPHAGEAL ECHOCARDIOGRAM (TEE);  Surgeon: Fay Records, MD;  Location: [facility] ENDOSCOPY;  Service: Cardiovascular;  Laterality: N/A;  . TEE WITHOUT CARDIOVERSION N/A 04/11/2016   Procedure: TRANSESOPHAGEAL ECHOCARDIOGRAM (TEE);  Surgeon: Josue Hector, MD;  Location: [facility] ENDOSCOPY;  Service: Cardiovascular;  Laterality: N/A;  . TEE/DCCV  02/07/07    Family History  Problem Relation Age of Onset  . Uterine cancer Mother   . Heart attack Maternal Uncle   . Heart disease Maternal Uncle   . Colon cancer Neg Hx   . Prostate cancer Neg Hx   . Diabetes Neg Hx    Social History:  reports that he quit smoking about 45 years ago. He has a 23.00 pack-year smoking history. He has never used smokeless tobacco. He reports that he does not drink alcohol or use drugs.  Allergies   Allergies  Allergen Reactions  . Amiodarone Other (See Comments)    Thyroid toxicity    Home Medications   Prior to Admission medications   Medication Sig Start Date End Date Taking? Authorizing Provider  apixaban (ELIQUIS) 5 MG TABS tablet Take 5 mg by mouth 2 (two) times daily.   Yes Historical Provider, MD  diltiazem (CARDIZEM CD) 120 MG 24 hr capsule Take 1 capsule (120 mg  total) by mouth daily. 04/15/16 09/07/2016 Yes Sherran Needs, NP  dofetilide (TIKOSYN) 125 MCG capsule Take 375 mcg by mouth 2 (two) times daily.    Yes Historical Provider, MD  furosemide (LASIX) 20 MG tablet Take one tablet (20 mg) by mouth once daily as needed for swelling/ shortness of breath 05/31/16  Yes Deboraha Sprang, MD  metoprolol (LOPRESSOR) 50 MG tablet Take 50 mg by mouth 2 (two) times daily.   Yes Historical Provider, MD  Multiple Vitamins-Minerals (CENTRUM) tablet Take 1 tablet by mouth daily.     Yes Historical Provider, MD  UNKNOWN TO PATIENT Obtain new eyedrop recently (call pharmacy, please)   Yes Historical Provider, MD     Trauma Course   Results for orders placed  or performed during the hospital encounter of 08/23/2016 (from the past 48 hour(s))  Comprehensive metabolic panel     Status: Abnormal   Collection Time: 09/21/2016  6:33 PM  Result Value Ref Range   Sodium 138 135 - 145 mmol/L   Potassium 3.4 (L) 3.5 - 5.1 mmol/L   Chloride 104 101 - 111 mmol/L   CO2 21 (L) 22 - 32 mmol/L   Glucose, Bld 120 (H) 65 - 99 mg/dL   BUN 17 6 - 20 mg/dL   Creatinine, Ser 0.99 0.61 - 1.24 mg/dL   Calcium 9.6 8.9 - 10.3 mg/dL   Total Protein 7.2 6.5 - 8.1 g/dL   Albumin 3.9 3.5 - 5.0 g/dL   AST 48 (H) 15 - 41 U/L   ALT 43 17 - 63 U/L   Alkaline Phosphatase 116 38 - 126 U/L   Total Bilirubin 0.6 0.3 - 1.2 mg/dL   GFR calc non Af Amer >60 >60 mL/min   GFR calc Af Amer >60 >60 mL/min    Comment: (NOTE) The eGFR has been calculated using the CKD EPI equation. This calculation has not been validated in all clinical situations. eGFR's persistently <60 mL/min signify possible Chronic Kidney Disease.    Anion gap 13 5 - 15  CBC     Status: Abnormal   Collection Time: 08/26/2016  6:33 PM  Result Value Ref Range   WBC 15.1 (H) 4.0 - 10.5 K/uL   RBC 4.65 4.22 - 5.81 MIL/uL   Hemoglobin 14.8 13.0 - 17.0 g/dL   HCT 44.0 39.0 - 52.0 %   MCV 94.6 78.0 - 100.0 fL   MCH 31.8 26.0 - 34.0 pg   MCHC 33.6 30.0 - 36.0 g/dL   RDW 13.2 11.5 - 15.5 %   Platelets 251 150 - 400 K/uL  Protime-INR     Status: None   Collection Time: 08/29/2016  6:33 PM  Result Value Ref Range   Prothrombin Time 14.2 11.4 - 15.2 seconds   INR 1.09   I-Stat Chem 8, ED     Status: Abnormal   Collection Time: 08/26/2016  7:18 PM  Result Value Ref Range   Sodium 140 135 - 145 mmol/L   Potassium 3.2 (L) 3.5 - 5.1 mmol/L   Chloride 103 101 - 111 mmol/L   BUN 21 (H) 6 - 20 mg/dL   Creatinine, Ser 1.00 0.61 - 1.24 mg/dL   Glucose, Bld 122 (H) 65 - 99 mg/dL   Calcium, Ion 1.13 (L) 1.15 - 1.40 mmol/L   TCO2 25 0 - 100 mmol/L   Hemoglobin 15.0 13.0 - 17.0 g/dL   HCT 44.0 39.0 - 52.0 %  I-Stat CG4  Lactic Acid, ED  Status: Abnormal   Collection Time: 09/12/2016  7:18 PM  Result Value Ref Range   Lactic Acid, Venous 4.69 (HH) 0.5 - 1.9 mmol/L   Comment NOTIFIED PHYSICIAN   Ethanol     Status: None   Collection Time: 09/21/2016  7:30 PM  Result Value Ref Range   Alcohol, Ethyl (B) <5 <5 mg/dL    Comment:        LOWEST DETECTABLE LIMIT FOR SERUM ALCOHOL IS 5 mg/dL FOR MEDICAL PURPOSES ONLY    Ct Head Wo Contrast  Result Date: 09/03/2016 CLINICAL DATA:  83 year old male status post fall backwards while caring groceries up stairs. On anti coagulation for a fib. Lethargic and confused on arrival. Initial encounter. EXAM: CT HEAD WITHOUT CONTRAST CT CERVICAL SPINE WITHOUT CONTRAST TECHNIQUE: Multidetector CT imaging of the head and cervical spine was performed following the standard protocol without intravenous contrast. Multiplanar CT image reconstructions of the cervical spine were also generated. COMPARISON:  Cervical spine radiographs 07/08/2016. FINDINGS: CT HEAD FINDINGS Brain: Small to moderate volume subarachnoid hemorrhage throughout the left convexity. There is a 5 mm hyperdense extra-axial hemorrhage along the left lateral convexity which could be subdural or a small epidural hematoma. There is a contralateral right parafalcine and tentorial subdural hematoma measuring up to 10 mm in thickness, predominantly posterior. This right side subdural hematoma also wraps around the inferior and lateral right hemisphere measuring up to 5 mm in thickness laterally and superiorly. Superimposed trace right vertex subarachnoid hemorrhage. No basilar cistern subarachnoid hemorrhage. No intraventricular hemorrhage or ventriculomegaly. No midline shift at this time. Basilar cisterns remain patent. No posterior fossa hemorrhage. No cortically based acute infarct identified. Vascular: Calcified atherosclerosis at the skull base. Skull: Osteopenia. Non depressed left sphenoid wing and squamous ule anterior  temporal bone skull fracture. This tracks cephalad and is mildly comminuted toward the vertex. The fracture appears to terminated along the lateral aspect of the left middle cranial fossa. There is a nearby a nondisplaced left zygomatic arch fracture. No more central skullbase fracture is identified. However, there is also a nondisplaced right zygomatic arch fracture as well as a mildly displaced right lateral orbital wall fracture (series 202 images 13 in 22). There might be a trace amount of right intraorbital contusion (image 25). No other orbital wall fracture. Sinuses/Orbits: Small fluid levels in the left sphenoid sinus which appear possibly hyperdense such as related to hemorrhage. Similar fluid level in the posterior left ethmoid air cell. Small lower density fluid level in the right maxillary sinus with alveolar recess mucosal thickening associated. Mild opacification of the and appear ear left mastoid air cells. The left tympanic cavity and remaining mastoids are clear. The right tympanic cavity and right mastoids are clear. Other: Large broad-based left scalp hematoma measuring up to 12 mm in thickness. Other scalp soft tissues appear normal. Negative orbits soft tissues. Negative visualized deep soft tissue spaces of the face except for a small volume of gas in the left masticator space. CT CERVICAL SPINE FINDINGS Osteopenia. Occipital condyles -C1 alignment is preserved. 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 There is associated disruption of the right C2-C3 facet joint, with an avulsed fragment off of the posterior C3  facet (sagittal image 29). There is subsequent  mild retrolisthesis of the right side body of C2 on C3. There is associated epidural hemorrhage. There is associated C2 level spinal stenosis which is probably mild. The C3 body and pedicles are intact. There is a mildly displaced C3 spinous process fracture in addition to the small avulsion from the posterior right facet. The C4 vertebra is intact.  The C5 vertebra is intact. The C6 vertebra appears intact. Possible mild compression fracture of the C7 superior endplate. There are questionable nondisplaced fractures through the bilateral C7 superior articulating facet - but this is probably artifact due to osteopenia. There is mild to moderate compression of the T3 vertebral body. There is a minimally displaced fracture of the posterior left first rib. There is a comminuted fracture of the right mid clavicle. The other visualized upper thoracic levels appear grossly intact. Negative lung apices.  No cervical prevertebral fluid. IMPRESSION: CT HEAD: 1. Comminuted non-depressed left frontotemporal skull fracture. Bilateral zygomatic arch fractures. Right lateral orbital wall fracture. 2. Five mm left lateral epidural versus subdural hematoma. Right subdural hematoma with 10 mm parafalcine and 5 mm peripheral components. 3. Left greater than right subarachnoid hemorrhage. 4. No midline shift at this time. No intraventricular hemorrhage or ventriculomegaly. No hemorrhagic cerebral contusion identified at this time. 5. Small volume of hemorrhage in the sphenoid sinuses but no central skullbase fracture identified. CT CERVICAL SPINE: 1. Severe C2 fracture is impacted, comminuted, and displaced. Associated disruption of the right C2-C3 facet joint. Subsequent C2 level epidural hematoma and multifactorial spinal stenosis (probably mild at this time). 2. Small avulsion fractures of the posterior right C3 facet and the C3 spinous process. 3. Suspected mild compression fracture of the C7 superior endplate. Questionable  nondisplaced bilateral C7 superior articulating facet fractures - but favor artifact due to osteopenia instead. 4. Age indeterminate compression of the T3 vertebral body. 5. Mildly displaced posterior left first rib fracture. Critical Value/emergent results were called by telephone at the time of interpretation on 09/05/2016 at 1954 hours to Dr. Ellender Hose , who verbally acknowledged these results. Electronically Signed   By: Genevie Ann M.D.   On: 08/27/2016 20:09   Ct Cervical Spine Wo Contrast  Result Date: 08/29/2016 CLINICAL DATA:  83 year old male status post fall backwards while caring groceries up stairs. On anti coagulation for a fib. Lethargic and confused on arrival. Initial encounter. EXAM: CT HEAD WITHOUT CONTRAST CT CERVICAL SPINE WITHOUT CONTRAST TECHNIQUE: Multidetector CT imaging of the head and cervical spine was performed following the standard protocol without intravenous contrast. Multiplanar CT image reconstructions of the cervical spine were also generated. COMPARISON:  Cervical spine radiographs 07/08/2016. FINDINGS: CT HEAD FINDINGS Brain: Small to moderate volume subarachnoid hemorrhage throughout the left convexity. There is a 5 mm hyperdense extra-axial hemorrhage along the left lateral convexity which could be subdural or a small epidural hematoma. There is a contralateral right parafalcine and tentorial subdural hematoma measuring up to 10 mm in thickness, predominantly posterior. This right side subdural hematoma also wraps around the inferior and lateral right hemisphere measuring up to 5 mm in thickness laterally and superiorly. Superimposed trace right vertex subarachnoid hemorrhage. No basilar cistern subarachnoid hemorrhage. No intraventricular hemorrhage or ventriculomegaly. No midline shift at this time. Basilar cisterns remain patent. No posterior fossa hemorrhage. No cortically based acute infarct identified. Vascular: Calcified atherosclerosis at the skull base. Skull: Osteopenia.  Non depressed left sphenoid wing and squamous ule anterior temporal bone skull fracture. This tracks cephalad and  is mildly comminuted toward the vertex. The fracture appears to terminated along the lateral aspect of the left middle cranial fossa. There is a nearby a nondisplaced left zygomatic arch fracture. No more central skullbase fracture is identified. However, there is also a nondisplaced right zygomatic arch fracture as well as a mildly displaced right lateral orbital wall fracture (series 202 images 13 in 22). There might be a trace amount of right intraorbital contusion (image 25). No other orbital wall fracture. Sinuses/Orbits: Small fluid levels in the left sphenoid sinus which appear possibly hyperdense such as related to hemorrhage. Similar fluid level in the posterior left ethmoid air cell. Small lower density fluid level in the right maxillary sinus with alveolar recess mucosal thickening associated. Mild opacification of the and appear ear left mastoid air cells. The left tympanic cavity and remaining mastoids are clear. The right tympanic cavity and right mastoids are clear. Other: Large broad-based left scalp hematoma measuring up to 12 mm in thickness. Other scalp soft tissues appear normal. Negative orbits soft tissues. Negative visualized deep soft tissue spaces of the face except for a small volume of gas in the left masticator space. CT CERVICAL SPINE FINDINGS Osteopenia. Occipital condyles -C1 alignment is preserved. 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 There is associated disruption of the right C2-C3 facet joint, with an avulsed fragment off of the  posterior C3 facet (sagittal image 29). There is subsequent mild retrolisthesis of the right side body of C2 on C3. There is associated epidural hemorrhage. There is associated C2 level spinal stenosis which is probably mild. The C3 body and pedicles are intact. There is a mildly displaced C3 spinous process fracture in addition to the small avulsion from the posterior right facet. The C4 vertebra is intact.  The C5 vertebra is intact. The C6 vertebra appears intact. Possible mild compression fracture of the C7 superior endplate. There are questionable nondisplaced fractures through the bilateral C7 superior articulating facet - but this is probably artifact due to osteopenia. There is mild to moderate compression of the T3 vertebral body. There is a minimally displaced fracture of the posterior left first rib. There is a comminuted fracture of the right mid clavicle. The other visualized upper thoracic levels appear grossly intact. Negative lung apices.  No cervical prevertebral fluid. IMPRESSION: CT HEAD: 1. Comminuted non-depressed left frontotemporal skull fracture. Bilateral zygomatic arch fractures. Right lateral orbital wall fracture. 2. Five mm left lateral epidural versus subdural hematoma. Right subdural hematoma with 10 mm parafalcine and 5 mm peripheral components. 3. Left greater than right subarachnoid hemorrhage. 4. No midline shift at this time. No intraventricular hemorrhage or ventriculomegaly. No hemorrhagic cerebral contusion identified at this time. 5. Small volume of hemorrhage in the sphenoid sinuses but no central skullbase fracture identified. CT CERVICAL SPINE: 1. Severe C2 fracture is impacted, comminuted, and displaced. Associated disruption of the right C2-C3 facet joint. Subsequent C2 level epidural hematoma and multifactorial spinal stenosis (probably mild at this time). 2. Small avulsion fractures of the posterior right C3 facet and the C3 spinous process. 3. Suspected mild  compression fracture of the C7 superior endplate. Questionable nondisplaced bilateral C7 superior articulating facet fractures - but favor artifact  due to osteopenia instead. 4. Age indeterminate compression of the T3 vertebral body. 5. Mildly displaced posterior left first rib fracture. Critical Value/emergent results were called by telephone at the time of interpretation on 09/10/2016 at 1954 hours to Dr. Ellender Hose , who verbally acknowledged these results. Electronically Signed   By: Genevie Ann M.D.   On: 09/19/2016 20:09   CLINICAL DATA:  Pain after fall  EXAM: CT CHEST, ABDOMEN, AND PELVIS WITH CONTRAST  TECHNIQUE: Multidetector CT imaging of the chest, abdomen and pelvis was performed following the standard protocol during bolus administration of intravenous contrast.  CONTRAST:  142m ISOVUE-300 IOPAMIDOL (ISOVUE-300) INJECTION 61%  COMPARISON:  04/13/2016 and 03/16/2008 CT abdomen and pelvis  FINDINGS: CT CHEST FINDINGS  Cardiovascular: Stable cardiomegaly without pericardial effusion. 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 What is indeterminate unfortunately is a whether there is an ascending aortic component due to motion. Vascular surgery consultation is recommended. Would have a low threshold for repeat imaging, with cardiac gating that may help solve this dilemma. Much of the dissection from the arch distal is thrombosed, with probable fenestration causing opacification on both sides of the dissection along the descending aorta. No mediastinal hematoma. No aneurysm. No large central pulmonary embolus. Coronary arteriosclerosis.  Mediastinum/Nodes: No adenopathy. Normal appearance of the thoracic esophagus. Trachea is patent.  Lungs/Pleura: Small nodular densities measuring up to 7 mm  are identified predominantly on the right and in the upper lobe. Study is somewhat limited to motion artifacts. Some the nodular densities may represent branch points for pulmonary vessels especially in the lower right upper lobe adjacent to the minor fissure. Dependent atelectasis noted.  Musculoskeletal: Multiple chronic thoracolumbar compression fractures, with slight loss of height at T3 since prior exam but stable along the remainder to at least the L1 level. Chronic fractures involve T3 and T6 through L1.  Go  CT ABDOMEN PELVIS FINDINGS  Hepatobiliary: 3 cm indeterminate hypodense lesion of the right hepatic lobe with Hounsfield units not definitive for simple cyst but given lack of significant change from 2009 is likely benign finding possibly complex cyst. No biliary dilatation. No solid enhancing mass lesions seen.  Pancreas: No pancreatic mass or ductal dilatation.  Spleen: No splenomegaly or focal mass.  No splenic laceration.  Adrenals/Urinary Tract: Unremarkable adrenal glands. No obstructive uropathy or mass. Physiologic distention of the bladder.  Stomach/Bowel: No acute inflammation or bowel obstruction. Appendectomy by report.  Vascular/Lymphatic: Aortic and branch vessel atherosclerosis. No lymphadenopathy.  Reproductive: Enlarged prostate with peripheral zone calcifications of the prostate measures approximately 6.1 x 5.2 x 5.2 cm (volume = 86 cm^3)  Other: Mild diastases of the rectus muscles.  Musculoskeletal: Chronic L3 through L5 compression fractures. Stable L1 moderate compression fractures since prior chest CT dated 04/13/2016. Mild superior endplate compression of L2 since 2009 but incompletely imaged on recent CT.  IMPRESSION: 1. New since the unenhanced exam of 04/13/2016 is a thoracic aortic dissection that is definitive from aortic arch at or just past the brachiocephalic artery to the distal descending thoracic aorta to the  level of the diaphragm. What is indeterminate unfortunately is a whether there is an ascending aortic component due to motion. Vascular surgery consultation is recommended. Would have a low threshold for repeat imaging, with cardiac gating that may help solve this dilemma. Much of the  dissection from the arch distal is thrombosed, with probable fenestration causing opacification on both sides of the dissection along the descending aorta. Critical Value/emergent results were called by telephone at the time of interpretation on  at 8:51 pm to Dr. Ovid Curd PAGE , who verbally acknowledged these results. 2. Relatively stable nodular opacities within the lungs more so on the right since recent comparison study, the largest measuring up to 7 mm. Given relative stability would recommend follow-up in 18-24 months from the initial CT dated 04/13/2016 per Fleischner Society recommendations. 3. Multiple thoracolumbar compression fractures are again noted slightly more accentuated at T3 since prior exam. An L2 compression is also noted which is new since the 2009 CT. 4. Approximately 3 cm right hepatic hypodensity grossly stable since 2009 and likely benign finding not definitively a simple cyst by Hounsfield unit criteria.   Electronically Signed   By: Ashley Royalty M.D.   On: 09/07/2016 20:57 Review of Systems  Constitutional: Negative for weight loss.  HENT: Negative for ear discharge, ear pain, hearing loss and tinnitus.   Eyes: Negative for blurred vision, double vision, photophobia and pain.  Respiratory: Positive for shortness of breath. Negative for cough and sputum production.   Cardiovascular: Negative for chest pain.  Gastrointestinal: Negative for abdominal pain, nausea and vomiting.  Genitourinary: Negative for dysuria, flank pain, frequency and urgency.  Musculoskeletal: Positive for falls and neck pain. Negative for back pain, joint pain and myalgias.  Neurological:  Positive for weakness. Negative for dizziness, tingling, sensory change, focal weakness, loss of consciousness and headaches.  Endo/Heme/Allergies: Does not bruise/bleed easily.  Psychiatric/Behavioral: Negative for depression, memory loss and substance abuse. The patient is not nervous/anxious.     Blood pressure (!) 160/103, pulse 88, temperature 97.8 F (36.6 C), temperature source Oral, resp. rate (!) 28, height '6\' 4"'$  (1.93 m), weight 116.6 kg (257 lb 0.9 oz), SpO2 96 %. Physical Exam  Constitutional: He appears well-developed and well-nourished.  HENT:  Head: Normocephalic.  Right Ear: External ear normal.  Left Ear: External ear normal.  Right auricular hematoma Left temporal hematoma/ abrasion Left zygomatic tenderness   Eyes: EOM are normal. Pupils are equal, round, and reactive to light.  Neck: No tracheal deviation present. No thyromegaly present.  Tender posteriorly  Cardiovascular: Normal rate.   Irregular rhythm   Respiratory: Effort normal and breath sounds normal.  GI: Soft. Bowel sounds are normal.  Healed midline incision  Genitourinary: Rectum normal and penis normal.  Musculoskeletal: Normal range of motion.  Neurological:  Agitated, confused; Follows some commands   Skin: Skin is warm and dry. Rash (After receiving KCentra and Fentanyl) noted.     Assessment/Plan Fall - anticoagulated 1.  Comminuted non-depressed left frontotemporal skull fracture 2.  Bilateral zygomatic arch fractures 3.  Right lateral orbital wall fracture 4.  Left subdural hematoma 5.  Right subdural hematoma  6.  Bilateral subarachnoid hemorrhage 7.  C2 fracture - impacted, comminuted, displaced 8.  C2-C3 right facet joint disruption 9.  C2 epidural hematoma 10.  Right C3 facet/ C3 spinous process 11.  C7 compression fracture 12.  Left posterior first rib fracture 13.  Thoracic aortic dissection - unable to determine whether there is ascending aortic component  Plan:  Admit  to ICU Neurosurgery has evaluated patient in ED.  No emergent recommendations Face - Will evaluate in AM Cardiology - to help manage BP and rhythm; no PO meds Vasc/ CT Surgery - evaluate aorta  With this constellation of injuries and  findings, prognosis is poor.  I have asked family regarding code status.  They think that he has a living will, but are looking for the document.  They don't think he wants "heroic measures" but are unsure at this time.     [facility] - [facility] - [facility] - [facility] - Colleen Can. 09/14/2016, 8:44 PM   Procedures

## 2016-09-06 NOTE — Consult Note (Addendum)
[facility]       [place] [address]             [phone]          CARDIOTHORACIC SURGERY CONSULTATION REPORT  PCP is Kathlene November, MD Referring Provider is Donnie Mesa, MD  Reason for consultation:  Aortic dissection  HPI:  Patient is an 83 year old male with multiple medical problems including long-standing atrial fibrillation on chronic anticoagulation therapy using Eliquis, cardiomyopathy with chronic combined systolic and diastolic congestive heart failure, anemia, hypertension, and history of GI bleed with Mallory-Weiss tear reportedly was in his usual state of health until this evening when he suffered a fall. According to the patient's family he was walking up a flight of stairs and was close to the top when apparently stumbled. His family heard a loud noise and found him laying prone at the bottom of the stairs. He was moaning incomprehensibly. EMS was called.  He was brought directly to the emergency department where he was evaluated and found to have subdural hematoma, fractured C2 cervical spine with impaction of the C2 vertebrae anteriorly and posteriorly, and an aortic dissection. Cardiothoracic surgical consultation was requested. The patient has been evaluated by Dr. Georgette Dover from the trauma service, Dr. Ellene Route from neurosurgery, and Dr. Scot Dock from vascular surgery. The patient has been made no CODE BLUE by Dr. Georgette Dover.  Upon my arrival the patient is lethargic but arousable. He responds to verbal stimuli. He is thrashing about. When he asks about whether or not he is having pain he states that he hurts all over. He denies chest pain or back pain.  The patient is married and lives with his wife locally. He has remained ambulatory and drives an automobile. According to his family has been having some cognitive problems recently. He has not had any specific complaints of chest pain or back pain. He has not had any recent falls prior to that which brought him to  the hospital although he has had some falls in the distant past. He has long-standing problems with severe exertional shortness of breath and fatigue, but this has been chronic and fairly stable.  Past Medical History:  Diagnosis Date  . Anemia   . Appendicitis    SBP 7/09 ruputre w/ RLQ abscess 03/16/08  . Atrial fibrillation ([place])   . Basal cell carcinoma of nose 2016   Right side nasal tip, [name]  . BPH (benign prostatic hyperplasia) 06/29/2011  . Cardiomyopathy    secondary to Tachycardia. A-Acute on chronic admit for CHF 6/08. B- CHF coupled with atrial fib RVR electrophysiology study 09/27/07.  A- Multiple flutters and Atrial fib- no ablation performed b- ibutilide cardoversion c- amiordarone load- d/c in sinus rhyrthm d- coumadin anticoagulation- stopped at time of GI bleed 11/09  . Glaucoma   . History of blood transfusion 2009   "related to rupture in esophagus" (04/13/2016)  . Hyperlipidemia   . Hypertension   . Knee fracture, right 08/2014   "no OR" (04/13/2016)  . Lung nodules    [name] 04/13/2016  . Mallory - Weiss tear    esophagitis, 6 unit hemorrhage- EPI injections   . Small bowel obstruction     Past Surgical History:  Procedure Laterality Date  . APPENDECTOMY  02/2008   exploratory, ruptured appendix, RLQ abscess  . BASAL CELL CARCINOMA EXCISION     nose  . CATARACT EXTRACTION W/ INTRAOCULAR LENS  IMPLANT, BILATERAL Bilateral   . COLONOSCOPY    .  COLONOSCOPY W/ BIOPSIES AND POLYPECTOMY  ~ 2015  . EP study  09/27/07   see pmh  . EXPLORATORY LAPAROTOMY  02/2008  . HEMORRHOID SURGERY    . TEE WITHOUT CARDIOVERSION N/A 02/08/2016   Procedure: TRANSESOPHAGEAL ECHOCARDIOGRAM (TEE);  Surgeon: Fay Records, MD;  [facility] ENDOSCOPY;  Service: Cardiovascular;  Laterality: N/A;  . TEE WITHOUT CARDIOVERSION N/A 04/11/2016   Procedure: TRANSESOPHAGEAL ECHOCARDIOGRAM (TEE);  Surgeon: Josue Hector, MD;  Location[facility] ENDOSCOPY;  Service: Cardiovascular;  Laterality: N/A;   . TEE/DCCV  02/07/07    Family History  Problem Relation Age of Onset  . Uterine cancer Mother   . Heart attack Maternal Uncle   . Heart disease Maternal Uncle   . Colon cancer Neg Hx   . Prostate cancer Neg Hx   . Diabetes Neg Hx     Social History   Social History  . Marital status: Married    Spouse name: N/A  . Number of children: 3  . Years of education: N/A   Occupational History  . retired --Catering manager     works part time   Social History Main Topics  . Smoking status: Former Smoker    Packs/day: 1.00    Years: 23.00    Quit date: 06/30/1971  . Smokeless tobacco: Never Used  . Alcohol use No  . Drug use: No  . Sexual activity: No   Other Topics Concern  . Not on file   Social History Narrative   Household-- pt and wife (wife developing dementia)   One son lives in [place], goes there from time to time              Prior to Admission medications   Medication Sig Start Date End Date Taking? Authorizing Provider  apixaban (ELIQUIS) 5 MG TABS tablet Take 5 mg by mouth 2 (two) times daily.   Yes Historical Provider, MD  diltiazem (CARDIZEM CD) 120 MG 24 hr capsule Take 1 capsule (120 mg total) by mouth daily. 04/15/16 09/04/2016 Yes Sherran Needs, NP  dofetilide (TIKOSYN) 125 MCG capsule Take 375 mcg by mouth 2 (two) times daily.    Yes Historical Provider, MD  furosemide (LASIX) 20 MG tablet Take one tablet (20 mg) by mouth once daily as needed for swelling/ shortness of breath 05/31/16  Yes Deboraha Sprang, MD  metoprolol (LOPRESSOR) 50 MG tablet Take 50 mg by mouth 2 (two) times daily.   Yes Historical Provider, MD  Multiple Vitamins-Minerals (CENTRUM) tablet Take 1 tablet by mouth daily.     Yes Historical Provider, MD  UNKNOWN TO PATIENT Obtain new eyedrop recently (call pharmacy, please)   Yes Historical Provider, MD    Current Facility-Administered Medications  Medication Dose Route Frequency Provider Last Rate Last Dose  . labetalol  (NORMODYNE,TRANDATE) injection 5 mg  5 mg Intravenous Q1H PRN Donnie Mesa, MD   5 mg at 09/21/2016 2132    Allergies  Allergen Reactions  . Amiodarone Other (See Comments)    Thyroid toxicity  . Fentanyl Rash    Possible source of a reaction he has experienced (given Benadryl to counteract)  . Kcentra [Prothrombin Complex Conc Human] Rash    Possible source of a reaction he has experienced (given Benadryl to counteract)      Review of Systems:  Per HPI     Physical Exam:   BP 165/99   Pulse 95   Temp 97.8 F (36.6 C) (Oral)   Resp (!) 27  Ht 6\' 4"  (1.93 m)   Wt 257 lb 0.9 oz (116.6 kg)   SpO2 91%   BMI 31.29 kg/m   General:  Elderly male in C-collar  HEENT:  Unremarkable   Neck:   no JVD, no bruits, no adenopathy   Chest:   clear to auscultation, symmetrical breath sounds, no wheezes, no rhonchi   CV:   RRR, no murmur   Abdomen:  soft, non-tender, no masses   Extremities:  warm, well-perfused, pulses diminished but palpable, no lower extremity edema  Rectal/GU  Deferred  Neuro:   Grossly non-focal and symmetrical throughout  Skin:   Clean and dry, no rashes, no breakdown  Diagnostic Tests:  CT CHEST, ABDOMEN, AND PELVIS WITH CONTRAST  TECHNIQUE: Multidetector CT imaging of the chest, abdomen and pelvis was performed following the standard protocol during bolus administration of intravenous contrast.  CONTRAST:  149mL ISOVUE-300 IOPAMIDOL (ISOVUE-300) INJECTION 61%  COMPARISON:  04/13/2016 and 03/16/2008 CT abdomen and pelvis  FINDINGS: CT CHEST FINDINGS  Cardiovascular: Stable cardiomegaly without pericardial effusion. 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 What is indeterminate unfortunately is a whether there is an ascending aortic component due to  motion. Vascular surgery consultation is recommended. Would have a low threshold for repeat imaging, with cardiac gating that may help solve this dilemma. Much of the dissection from the arch distal is thrombosed, with probable fenestration causing opacification on both sides of the dissection along the descending aorta. No mediastinal hematoma. No aneurysm. No large central pulmonary embolus. Coronary arteriosclerosis.  Mediastinum/Nodes: No adenopathy. Normal appearance of the thoracic esophagus. Trachea is patent.  Lungs/Pleura: Small nodular densities measuring up to 7 mm are identified predominantly on the right and in the upper lobe. Study is somewhat limited to motion artifacts. Some the nodular densities may represent branch points for pulmonary vessels especially in the lower right upper lobe adjacent to the minor fissure. Dependent atelectasis noted.  Musculoskeletal: Multiple chronic thoracolumbar compression fractures, with slight loss of height at T3 since prior exam but stable along the remainder to at least the L1 level. Chronic fractures involve T3 and T6 through L1.  Go  CT ABDOMEN PELVIS FINDINGS  Hepatobiliary: 3 cm indeterminate hypodense lesion of the right hepatic lobe with Hounsfield units not definitive for simple cyst but given lack of significant change from 2009 is likely benign finding possibly complex cyst. No biliary dilatation. No solid enhancing mass lesions seen.  Pancreas: No pancreatic mass or ductal dilatation.  Spleen: No splenomegaly or focal mass.  No splenic laceration.  Adrenals/Urinary Tract: Unremarkable adrenal glands. No obstructive uropathy or mass. Physiologic distention of the bladder.  Stomach/Bowel: No acute inflammation or bowel obstruction. Appendectomy by report.  Vascular/Lymphatic: Aortic and branch vessel atherosclerosis. No lymphadenopathy.  Reproductive: Enlarged prostate with peripheral zone  calcifications of the prostate measures approximately 6.1 x 5.2 x 5.2 cm (volume = 86 cm^3)  Other: Mild diastases of the rectus muscles.  Musculoskeletal: Chronic L3 through L5 compression fractures. Stable L1 moderate compression fractures since prior chest CT dated 04/13/2016. Mild superior endplate compression of L2 since 2009 but incompletely imaged on recent CT.  IMPRESSION: 1. New since the unenhanced exam of 04/13/2016 is a thoracic aortic dissection that is definitive from aortic arch at or just past the brachiocephalic artery to the distal descending thoracic  aorta to the level of the diaphragm. What is indeterminate unfortunately is a whether there is an ascending aortic component due to motion. Vascular surgery consultation is recommended. Would have a low threshold for repeat imaging, with cardiac gating that may help solve this dilemma. Much of the dissection from the arch distal is thrombosed, with probable fenestration causing opacification on both sides of the dissection along the descending aorta. Critical Value/emergent results were called by telephone at the time of interpretation on 09/02/2016 at 8:51 pm to Dr. Ovid Curd PAGE , who verbally acknowledged these results. 2. Relatively stable nodular opacities within the lungs more so on the right since recent comparison study, the largest measuring up to 7 mm. Given relative stability would recommend follow-up in 18-24 months from the initial CT dated 04/13/2016 per Fleischner Society recommendations. 3. Multiple thoracolumbar compression fractures are again noted slightly more accentuated at T3 since prior exam. An L2 compression is also noted which is new since the 2009 CT. 4. Approximately 3 cm right hepatic hypodensity grossly stable since 2009 and likely benign finding not definitively a simple cyst by Hounsfield unit criteria.   Electronically Signed   By: Ashley Royalty M.D.   On: 09/01/2016  20:57    CT HEAD WITHOUT CONTRAST  CT CERVICAL SPINE WITHOUT CONTRAST  TECHNIQUE: Multidetector CT imaging of the head and cervical spine was performed following the standard protocol without intravenous contrast. Multiplanar CT image reconstructions of the cervical spine were also generated.  COMPARISON:  Cervical spine radiographs 07/08/2016.  FINDINGS: CT HEAD FINDINGS  Brain: Small to moderate volume subarachnoid hemorrhage throughout the left convexity. There is a 5 mm hyperdense extra-axial hemorrhage along the left lateral convexity which could be subdural or a small epidural hematoma. There is a contralateral right parafalcine and tentorial subdural hematoma measuring up to 10 mm in thickness, predominantly posterior. This right side subdural hematoma also wraps around the inferior and lateral right hemisphere measuring up to 5 mm in thickness laterally and superiorly. Superimposed trace right vertex subarachnoid hemorrhage.  No basilar cistern subarachnoid hemorrhage. No intraventricular hemorrhage or ventriculomegaly. No midline shift at this time. Basilar cisterns remain patent. No posterior fossa hemorrhage. No cortically based acute infarct identified.  Vascular: Calcified atherosclerosis at the skull base.  Skull: Osteopenia. Non depressed left sphenoid wing and squamous ule anterior temporal bone skull fracture. This tracks cephalad and is mildly comminuted toward the vertex. The fracture appears to terminated along the lateral aspect of the left middle cranial fossa. There is a nearby a nondisplaced left zygomatic arch fracture.  No more central skullbase fracture is identified.  However, there is also a nondisplaced right zygomatic arch fracture as well as a mildly displaced right lateral orbital wall fracture (series 202 images 13 in 22). There might be a trace amount of right intraorbital contusion (image 25). No other orbital wall  fracture.  Sinuses/Orbits: Small fluid levels in the left sphenoid sinus which appear possibly hyperdense such as related to hemorrhage. Similar fluid level in the posterior left ethmoid air cell. Small lower density fluid level in the right maxillary sinus with alveolar recess mucosal thickening associated. Mild opacification of the and appear ear left mastoid air cells. The left tympanic cavity and remaining mastoids are clear. The right tympanic cavity and right mastoids are clear.  Other: Large broad-based left scalp hematoma measuring up to 12 mm in thickness.  Other scalp soft tissues appear normal. Negative orbits soft tissues. Negative visualized deep soft tissue spaces of the  face except for a small volume of gas in the left masticator space.  CT CERVICAL SPINE FINDINGS  Osteopenia.  Occipital condyles -C1 alignment is preserved. And anterior C1 ring -odontoid alignment is preserved (probably due to ankylosis  However, there is a severe C2 vertebral fracture including comminuted and impacted fractures of the C2 body (sagittal image 41), comminuted and displaced fractures of the posterior right C2 arch and spinous process, and comminuted fractures of the right C2 lateral mass and right C2 transverse process.  There is associated disruption of the right C2-C3 facet joint, with an avulsed fragment off of the posterior C3 facet (sagittal image 29). There is subsequent mild retrolisthesis of the right side body of C2 on C3.  There is associated epidural hemorrhage. There is associated C2 level spinal stenosis which is probably mild.  The C3 body and pedicles are intact. There is a mildly displaced C3 spinous process fracture in addition to the small avulsion from the posterior right facet.  The C4 vertebra is intact.  The C5 vertebra is intact.  The C6 vertebra appears intact.  Possible mild compression fracture of the C7 superior endplate. There are  questionable nondisplaced fractures through the bilateral C7 superior articulating facet - but this is probably artifact due to osteopenia.  There is mild to moderate compression of the T3 vertebral body. There is a minimally displaced fracture of the posterior left first rib. There is a comminuted fracture of the right mid clavicle. The other visualized upper thoracic levels appear grossly intact.  Negative lung apices.  No cervical prevertebral fluid.  IMPRESSION: CT HEAD:  1. Comminuted non-depressed left frontotemporal skull fracture. Bilateral zygomatic arch fractures. Right lateral orbital wall fracture. 2. Five mm left lateral epidural versus subdural hematoma. Right subdural hematoma with 10 mm parafalcine and 5 mm peripheral components. 3. Left greater than right subarachnoid hemorrhage. 4. No midline shift at this time. No intraventricular hemorrhage or ventriculomegaly. No hemorrhagic cerebral contusion identified at this time. 5. Small volume of hemorrhage in the sphenoid sinuses but no central skullbase fracture identified.  CT CERVICAL SPINE:  1. Severe C2 fracture is impacted, comminuted, and displaced. Associated disruption of the right C2-C3 facet joint. Subsequent C2 level epidural hematoma and multifactorial spinal stenosis (probably mild at this time). 2. Small avulsion fractures of the posterior right C3 facet and the C3 spinous process. 3. Suspected mild compression fracture of the C7 superior endplate. Questionable nondisplaced bilateral C7 superior articulating facet fractures - but favor artifact due to osteopenia instead. 4. Age indeterminate compression of the T3 vertebral body. 5. Mildly displaced posterior left first rib fracture. Critical Value/emergent results were called by telephone at the time of interpretation on 09/14/2016 at 1954 hours to Dr. Ellender Hose , who verbally acknowledged these results.   Electronically Signed   By: Genevie Ann M.D.   On: 09/17/2016 20:09     Impression:  I have personally reviewed the CT scan of the chest, abdomen, and pelvis performed at the time the patient was evaluated in the emergency department this evening. The patient has an aortic dissection that is probably type B.  It clearly involves the entire descending thoracic aorta. The proximal extent of the dissection is not clear as this scan was compromised by motion artifact and the fact that it was not performed as a CT angiogram, but there is no definite involvement of the ascending thoracic aorta or transverse aortic arch.  There is no pericardial effusion. The radiographic appearance of  the thoracic aorta is not characteristic of a typical posttraumatic aortic transection. Whether or not the aortic dissection developed as a complication of the patient's fall or was present prior to the fall is unclear.  The patient would not be considered candidate for surgical repair of type A aortic dissection, complicated type B dissection, or traumatic aortic disruption under any circumstances because of his advanced age, numerous comorbid medical problems, and acute presentation with subdural hematoma and fractured cervical spine.    Plan:  I recommend treating the patient's aortic dissection as though it is a type B dissection using pharmacologic control of his blood pressure, preferably including the use of beta blockers.  Anticoagulation for atrial fibrillation must be stopped indefinitely. Repeat imaging could be considered at some point for prognostic purposes to more carefully identify the proximal stent of the dissection, but this will not change the patient's management.  I discussed matters at length with the patient's wife and family. The patient has a living will and they have expressed clear wishes that the patient would not want have aggressive or heroic efforts in the event of sudden cardiac or respiratory decompensation. We will continue to  follow intermittently but defer the patient's day-to-day management to the trauma service and other consultants.  Please call if specific questions arise.   I spent in excess of 60 minutes during the conduct of this hospital consultation and >50% of this time involved direct face-to-face encounter for counseling and/or coordination of the patient's care.    Valentina Gu. Roxy Manns, MD 09/11/2016 10:02 PM

## 2016-09-06 NOTE — Consult Note (Signed)
Cardiology Consult    Patient ID: Antonio [name] MRN: [id number], DOB/AGE: March 20, 1929   Admit date: 09/05/2016 Date of Consult: 08/30/2016  Primary Physician: Kathlene November, MD Primary Cardiologist: Caryl Comes Requesting Provider: Georgette Dover  Patient Profile    83 y o man with afib now s/p fall  Past Medical History   Past Medical History:  Diagnosis Date  . Anemia   . Appendicitis    SBP 7/09 ruputre w/ RLQ abscess 03/16/08  . Atrial fibrillation ([place])   . Basal cell carcinoma of nose 2016   Right side nasal tip, [name]  . BPH (benign prostatic hyperplasia) 06/29/2011  . Cardiomyopathy    secondary to Tachycardia. A-Acute on chronic admit for CHF 6/08. B- CHF coupled with atrial fib RVR electrophysiology study 09/27/07.  A- Multiple flutters and Atrial fib- no ablation performed b- ibutilide cardoversion c- amiordarone load- d/c in sinus rhyrthm d- coumadin anticoagulation- stopped at time of GI bleed 11/09  . Glaucoma   . History of blood transfusion 2009   "related to rupture in esophagus" (04/13/2016)  . Hyperlipidemia   . Hypertension   . Knee fracture, right 08/2014   "no OR" (04/13/2016)  . Lung nodules    [name] 04/13/2016  . Mallory - Weiss tear    esophagitis, 6 unit hemorrhage- EPI injections   . Small bowel obstruction     Past Surgical History:  Procedure Laterality Date  . APPENDECTOMY  02/2008   exploratory, ruptured appendix, RLQ abscess  . BASAL CELL CARCINOMA EXCISION     nose  . CATARACT EXTRACTION W/ INTRAOCULAR LENS  IMPLANT, BILATERAL Bilateral   . COLONOSCOPY    . COLONOSCOPY W/ BIOPSIES AND POLYPECTOMY  ~ 2015  . EP study  09/27/07   see pmh  . EXPLORATORY LAPAROTOMY  02/2008  . HEMORRHOID SURGERY    . TEE WITHOUT CARDIOVERSION N/A 02/08/2016   Procedure: TRANSESOPHAGEAL ECHOCARDIOGRAM (TEE);  Surgeon: Fay Records, MD;  Location: [facility] ENDOSCOPY;  Service: Cardiovascular;  Laterality: N/A;  . TEE WITHOUT CARDIOVERSION N/A 04/11/2016   Procedure:  TRANSESOPHAGEAL ECHOCARDIOGRAM (TEE);  Surgeon: Josue Hector, MD;  Location: [facility] ENDOSCOPY;  Service: Cardiovascular;  Laterality: N/A;  . TEE/DCCV  02/07/07     Allergies  Allergies  Allergen Reactions  . Amiodarone Other (See Comments)    Thyroid toxicity  . Fentanyl Rash    Possible source of a reaction he has experienced (given Benadryl to counteract)  . Kcentra [Prothrombin Complex Conc Human] Rash    Possible source of a reaction he has experienced (given Benadryl to counteract)    History of Present Illness    Antonio [name] has a PMH of Afib and tachycardia induced CM. NO rate and rhythm controled an LVEF recovered. More recently transitioned to Tikosyn. Presents from home after a fall. Unclear what led to the fall but he was bringing up groceries the stairs. Unwitnessed fall. No loss of consciousness per family. Patient was confused after the fall. Signs of bleeding externally. Last apixaban intake in the morning.  In the ED CT scans showed a subdural and subarachnoid bleed, fractures C spine and dissected arch/descending aorta. Due to motion artefact the radiologist could not fully determine whether ascending aorta is affected.   I was asked by surgery to assist in the medical management of his home meds and determine which meds need to be continued given his inability for po intake due to confusion and combativeness.     Inpatient Medications  fentanyl shot once  Family History    Family History  Problem Relation Age of Onset  . Uterine cancer Mother   . Heart attack Maternal Uncle   . Heart disease Maternal Uncle   . Colon cancer Neg Hx   . Prostate cancer Neg Hx   . Diabetes Neg Hx     Social History    Social History   Social History  . Marital status: Married    Spouse name: N/A  . Number of children: 3  . Years of education: N/A   Occupational History  . retired --Catering manager     works part time   Social History Main Topics  . Smoking status:  Former Smoker    Packs/day: 1.00    Years: 23.00    Quit date: 06/30/1971  . Smokeless tobacco: Never Used  . Alcohol use No  . Drug use: No  . Sexual activity: No   Other Topics Concern  . Not on file   Social History Narrative   Household-- pt and wife (wife developing dementia)   One son lives in [place], goes there from time to time               Review of Systems    General:  No chills, fever, night sweats or weight changes.  Cardiovascular:  No chest pain, dyspnea on exertion, edema, orthopnea, palpitations, paroxysmal nocturnal dyspnea. Dermatological: No rash, lesions/masses Respiratory: No cough, dyspnea Urologic: No hematuria, dysuria Abdominal:   No nausea, vomiting, diarrhea, bright red blood per rectum, melena, or hematemesis Neurologic:  No visual changes, wkns, changes in mental status. All other systems reviewed and are otherwise negative except as noted above.  Physical Exam    Blood pressure 165/99, pulse 95, temperature 97.8 F (36.6 C), temperature source Oral, resp. rate (!) 27, height 6\' 4"  (1.93 m), weight 116.6 kg (257 lb 0.9 oz), SpO2 91 %.  General: in distress Psych: confused Neuro: Alert and oriented X 3. Moves all extremities spontaneously. HEENT: swollen right ear  Neck: Supple without bruits or JVD. Lungs:  Resp regular and unlabored, CTA. Heart: RRR no s3, s4, or murmurs. Abdomen: Soft, non-tender, non-distended, BS + x 4.  Extremities: No clubbing, cyanosis or edema. DP/PT/Radials 2+ and equal bilaterally.  Labs    Troponin (Point of Care Test) No results for input(s): TROPIPOC in the last 72 hours. No results for input(s): CKTOTAL, CKMB, TROPONINI in the last 72 hours. Lab Results  Component Value Date   WBC 15.1 (H) 09/15/2016   HGB 15.0 09/14/2016   HCT 44.0 09/15/2016   MCV 94.6 09/07/2016   PLT 251 09/03/2016    Recent Labs Lab 08/26/2016 1833 08/29/2016 1918  NA 138 140  K 3.4* 3.2*  CL 104 103  CO2 21*  --   BUN  17 21*  CREATININE 0.99 1.00  CALCIUM 9.6  --   PROT 7.2  --   BILITOT 0.6  --   ALKPHOS 116  --   ALT 43  --   AST 48*  --   GLUCOSE 120* 122*   Lab Results  Component Value Date   CHOL 225 (H) 06/29/2012   HDL 38.90 (L) 06/29/2012   LDLCALC  02/08/2007    82        Total Cholesterol/HDL:CHD Risk Coronary Heart Disease Risk Table                     Men   Women  1/2 Average Risk   3.4   3.3   TRIG 183.0 (H) 06/29/2012   No results found for: [facility]   Radiology Studies    Ct Head Wo Contrast  Result Date: 08/27/2016 CLINICAL DATA:  83 year old male status post fall backwards while caring groceries up stairs. On anti coagulation for a fib. Lethargic and confused on arrival. Initial encounter. EXAM: CT HEAD WITHOUT CONTRAST CT CERVICAL SPINE WITHOUT CONTRAST TECHNIQUE: Multidetector CT imaging of the head and cervical spine was performed following the standard protocol without intravenous contrast. Multiplanar CT image reconstructions of the cervical spine were also generated. COMPARISON:  Cervical spine radiographs 07/08/2016. FINDINGS: CT HEAD FINDINGS Brain: Small to moderate volume subarachnoid hemorrhage throughout the left convexity. There is a 5 mm hyperdense extra-axial hemorrhage along the left lateral convexity which could be subdural or a small epidural hematoma. There is a contralateral right parafalcine and tentorial subdural hematoma measuring up to 10 mm in thickness, predominantly posterior. This right side subdural hematoma also wraps around the inferior and lateral right hemisphere measuring up to 5 mm in thickness laterally and superiorly. Superimposed trace right vertex subarachnoid hemorrhage. No basilar cistern subarachnoid hemorrhage. No intraventricular hemorrhage or ventriculomegaly. No midline shift at this time. Basilar cisterns remain patent. No posterior fossa hemorrhage. No cortically based acute infarct identified. Vascular: Calcified atherosclerosis at the  skull base. Skull: Osteopenia. Non depressed left sphenoid wing and squamous ule anterior temporal bone skull fracture. This tracks cephalad and is mildly comminuted toward the vertex. The fracture appears to terminated along the lateral aspect of the left middle cranial fossa. There is a nearby a nondisplaced left zygomatic arch fracture. No more central skullbase fracture is identified. However, there is also a nondisplaced right zygomatic arch fracture as well as a mildly displaced right lateral orbital wall fracture (series 202 images 13 in 22). There might be a trace amount of right intraorbital contusion (image 25). No other orbital wall fracture. Sinuses/Orbits: Small fluid levels in the left sphenoid sinus which appear possibly hyperdense such as related to hemorrhage. Similar fluid level in the posterior left ethmoid air cell. Small lower density fluid level in the right maxillary sinus with alveolar recess mucosal thickening associated. Mild opacification of the and appear ear left mastoid air cells. The left tympanic cavity and remaining mastoids are clear. The right tympanic cavity and right mastoids are clear. Other: Large broad-based left scalp hematoma measuring up to 12 mm in thickness. Other scalp soft tissues appear normal. Negative orbits soft tissues. Negative visualized deep soft tissue spaces of the face except for a small volume of gas in the left masticator space. CT CERVICAL SPINE FINDINGS Osteopenia. Occipital condyles -C1 alignment is preserved. 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 There is associated disruption of the right C2-C3 facet joint, with  an avulsed fragment off of the posterior C3 facet (sagittal image 29). There is subsequent mild retrolisthesis of the right side body of C2 on C3. There is associated epidural hemorrhage. There is associated C2 level spinal stenosis which is probably mild. The C3 body and pedicles are intact. There is a mildly displaced C3 spinous process fracture in addition to the small avulsion from the  posterior right facet. The C4 vertebra is intact.  The C5 vertebra is intact. The C6 vertebra appears intact. Possible mild compression fracture of the C7 superior endplate. There are questionable nondisplaced fractures through the bilateral C7 superior articulating facet - but this is probably artifact due to osteopenia. There is mild to moderate compression of the T3 vertebral body. There is a minimally displaced fracture of the posterior left first rib. There is a comminuted fracture of the right mid clavicle. The other visualized upper thoracic levels appear grossly intact. Negative lung apices.  No cervical prevertebral fluid. IMPRESSION: CT HEAD: 1. Comminuted non-depressed left frontotemporal skull fracture. Bilateral zygomatic arch fractures. Right lateral orbital wall fracture. 2. Five mm left lateral epidural versus subdural hematoma. Right subdural hematoma with 10 mm parafalcine and 5 mm peripheral components. 3. Left greater than right subarachnoid hemorrhage. 4. No midline shift at this time. No intraventricular hemorrhage or ventriculomegaly. No hemorrhagic cerebral contusion identified at this time. 5. Small volume of hemorrhage in the sphenoid sinuses but no central skullbase fracture identified. CT CERVICAL SPINE: 1. Severe C2 fracture is impacted, comminuted, and displaced. Associated disruption of the right C2-C3 facet joint. Subsequent C2 level epidural hematoma and multifactorial spinal stenosis (probably mild at this time). 2. Small avulsion fractures of the posterior right C3 facet and the C3 spinous  process. 3. Suspected mild compression fracture of the C7 superior endplate. Questionable nondisplaced bilateral C7 superior articulating facet fractures - but favor artifact due to osteopenia instead. 4. Age indeterminate compression of the T3 vertebral body. 5. Mildly displaced posterior left first rib fracture. Critical Value/emergent results were called by telephone at the time of interpretation on 09/02/2016 at 1954 hours to Dr. Ellender Hose , who verbally acknowledged these results. Electronically Signed   By: Genevie Ann M.D.   On: 09/03/2016 20:09   Ct Chest W Contrast  Result Date: 09/14/2016 CLINICAL DATA:  Pain after fall EXAM: CT CHEST, ABDOMEN, AND PELVIS WITH CONTRAST TECHNIQUE: Multidetector CT imaging of the chest, abdomen and pelvis was performed following the standard protocol during bolus administration of intravenous contrast. CONTRAST:  151mL ISOVUE-300 IOPAMIDOL (ISOVUE-300) INJECTION 61% COMPARISON:  04/13/2016 and 03/16/2008 CT abdomen and pelvis FINDINGS: CT CHEST FINDINGS Cardiovascular: Stable cardiomegaly without pericardial effusion. 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 What is indeterminate unfortunately is a whether there is an ascending aortic component due to motion. Vascular surgery consultation is recommended. Would have a low threshold for repeat imaging, with cardiac gating that may help solve this dilemma. Much of the dissection from the arch distal is thrombosed, with probable fenestration causing opacification on both sides of the dissection along the descending aorta. No mediastinal hematoma. No aneurysm. No large central pulmonary embolus. Coronary arteriosclerosis. Mediastinum/Nodes: No adenopathy. Normal appearance of the thoracic esophagus. Trachea is patent. Lungs/Pleura: Small nodular  densities measuring up to 7 mm are identified predominantly on the right and in the upper lobe. Study is somewhat limited to motion artifacts. Some the nodular densities may represent branch points for pulmonary vessels especially in the lower right upper lobe adjacent to the minor fissure. Dependent atelectasis noted. Musculoskeletal: Multiple chronic thoracolumbar compression fractures, with slight loss of height at T3 since prior exam but stable along the remainder to at least the L1 level. Chronic fractures involve T3 and T6 through L1.  Go CT ABDOMEN PELVIS FINDINGS Hepatobiliary: 3 cm indeterminate hypodense lesion of the right hepatic lobe with Hounsfield units not definitive for simple cyst but given lack of significant change from 2009 is likely benign finding possibly complex cyst. No biliary dilatation. No solid enhancing mass lesions seen. Pancreas: No pancreatic mass or ductal dilatation. Spleen: No splenomegaly or focal mass.  No splenic laceration. Adrenals/Urinary Tract: Unremarkable adrenal glands. No obstructive uropathy or mass. Physiologic distention of the bladder. Stomach/Bowel: No acute inflammation or bowel obstruction. Appendectomy by report. Vascular/Lymphatic: Aortic and branch vessel atherosclerosis. No lymphadenopathy. Reproductive: Enlarged prostate with peripheral zone calcifications of the prostate measures approximately 6.1 x 5.2 x 5.2 cm (volume = 86 cm^3) Other: Mild diastases of the rectus muscles. Musculoskeletal: Chronic L3 through L5 compression fractures. Stable L1 moderate compression fractures since prior chest CT dated 04/13/2016. Mild superior endplate compression of L2 since 2009 but incompletely imaged on recent CT. IMPRESSION: 1. 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 What is  indeterminate unfortunately is a whether there is an ascending aortic component due to motion. Vascular surgery consultation is recommended. Would have a low threshold for repeat imaging, with cardiac gating that may help solve this dilemma. Much of the dissection from the arch distal is thrombosed, with probable fenestration causing opacification on both sides of the dissection along the descending aorta. Critical Value/emergent results were called by telephone at the time of interpretation on  at 8:51 pm to Dr. Ovid Curd PAGE , who verbally acknowledged these results. 2. Relatively stable nodular opacities within the lungs more so on the right since recent comparison study, the largest measuring up to 7 mm. Given relative stability would recommend follow-up in 18-24 months from the initial CT dated 04/13/2016 per Fleischner Society recommendations. 3. Multiple thoracolumbar compression fractures are again noted slightly more accentuated at T3 since prior exam. An L2 compression is also noted which is new since the 2009 CT. 4. Approximately 3 cm right hepatic hypodensity grossly stable since 2009 and likely benign finding not definitively a simple cyst by Hounsfield unit criteria. Electronically Signed   By: Ashley Royalty M.D.   On: 09/15/2016 20:57   Ct Cervical Spine Wo Contrast  Result Date: 09/05/2016 CLINICAL DATA:  83 year old male status post fall backwards while caring groceries up stairs. On anti coagulation for a fib. Lethargic and confused on arrival. Initial encounter. EXAM: CT HEAD WITHOUT CONTRAST CT CERVICAL SPINE WITHOUT CONTRAST TECHNIQUE: Multidetector CT imaging of the head and cervical spine was performed following the standard protocol without intravenous contrast. Multiplanar CT image reconstructions of the cervical spine were also generated. COMPARISON:  Cervical spine radiographs 07/08/2016. FINDINGS: CT HEAD FINDINGS Brain: Small to moderate volume subarachnoid hemorrhage throughout  the left convexity. There is a 5 mm hyperdense extra-axial hemorrhage along the left lateral convexity which could be subdural or a small epidural hematoma. There is a contralateral right parafalcine and tentorial subdural hematoma measuring up to 10 mm in thickness, predominantly posterior. This right side subdural hematoma also wraps around the inferior and lateral right hemisphere measuring up to 5 mm in thickness laterally and superiorly. Superimposed trace right vertex subarachnoid hemorrhage. No basilar cistern subarachnoid hemorrhage. No intraventricular hemorrhage or ventriculomegaly. No midline shift at this time. Basilar cisterns remain patent. No posterior fossa hemorrhage. No cortically based acute infarct identified.  Vascular: Calcified atherosclerosis at the skull base. Skull: Osteopenia. Non depressed left sphenoid wing and squamous ule anterior temporal bone skull fracture. This tracks cephalad and is mildly comminuted toward the vertex. The fracture appears to terminated along the lateral aspect of the left middle cranial fossa. There is a nearby a nondisplaced left zygomatic arch fracture. No more central skullbase fracture is identified. However, there is also a nondisplaced right zygomatic arch fracture as well as a mildly displaced right lateral orbital wall fracture (series 202 images 13 in 22). There might be a trace amount of right intraorbital contusion (image 25). No other orbital wall fracture. Sinuses/Orbits: Small fluid levels in the left sphenoid sinus which appear possibly hyperdense such as related to hemorrhage. Similar fluid level in the posterior left ethmoid air cell. Small lower density fluid level in the right maxillary sinus with alveolar recess mucosal thickening associated. Mild opacification of the and appear ear left mastoid air cells. The left tympanic cavity and remaining mastoids are clear. The right tympanic cavity and right mastoids are clear. Other: Large broad-based  left scalp hematoma measuring up to 12 mm in thickness. Other scalp soft tissues appear normal. Negative orbits soft tissues. Negative visualized deep soft tissue spaces of the face except for a small volume of gas in the left masticator space. CT CERVICAL SPINE FINDINGS Osteopenia. Occipital condyles -C1 alignment is preserved. 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 There is associated disruption of the right C2-C3 facet joint, with an avulsed fragment off of the posterior C3 facet (sagittal image 29). There is subsequent mild retrolisthesis of the right side body of C2 on C3. There is associated epidural hemorrhage. There is associated C2 level spinal stenosis which is probably mild. The C3 body and pedicles are intact. There is a mildly displaced C3 spinous process fracture in addition to the small avulsion from the posterior right facet. The C4 vertebra is intact.  The C5 vertebra is intact. The C6 vertebra appears intact. Possible mild compression fracture of the C7 superior endplate. There are questionable nondisplaced fractures through the bilateral C7 superior articulating facet - but this is probably artifact due to osteopenia. There is mild to moderate compression of the T3 vertebral body. There is a minimally displaced fracture of the posterior left first rib. There is a comminuted fracture of the right mid clavicle. The other visualized upper thoracic levels appear grossly intact. Negative lung apices.  No cervical prevertebral fluid. IMPRESSION: CT HEAD: 1. Comminuted non-depressed left frontotemporal skull fracture. Bilateral zygomatic arch fractures. Right  lateral orbital wall fracture. 2. Five mm left lateral epidural versus subdural hematoma. Right subdural hematoma with 10 mm parafalcine and 5 mm peripheral components. 3. Left greater than right subarachnoid hemorrhage. 4. No midline shift at this time. No intraventricular hemorrhage or ventriculomegaly. No hemorrhagic cerebral contusion identified at this time. 5. Small volume of hemorrhage in the sphenoid sinuses but no central skullbase fracture identified. CT CERVICAL SPINE: 1. Severe C2 fracture is impacted, comminuted, and displaced. Associated disruption of the right C2-C3 facet joint. Subsequent C2 level epidural hematoma and multifactorial spinal stenosis (probably mild at this time). 2. Small avulsion fractures of the posterior right C3 facet and  the C3 spinous process. 3. Suspected mild compression fracture of the C7 superior endplate. Questionable nondisplaced bilateral C7 superior articulating facet fractures - but favor artifact due to osteopenia instead. 4. Age indeterminate compression of the T3 vertebral body. 5. Mildly displaced posterior left first rib fracture. Critical Value/emergent results were called by telephone at the time of interpretation on 09/15/2016 at 1954 hours to Dr. Ellender Hose , who verbally acknowledged these results. Electronically Signed   By: Genevie Ann M.D.   On: 09/19/2016 20:09   Ct Abdomen Pelvis W Contrast  Result Date: 09/01/2016 CLINICAL DATA:  Pain after fall EXAM: CT CHEST, ABDOMEN, AND PELVIS WITH CONTRAST TECHNIQUE: Multidetector CT imaging of the chest, abdomen and pelvis was performed following the standard protocol during bolus administration of intravenous contrast. CONTRAST:  189mL ISOVUE-300 IOPAMIDOL (ISOVUE-300) INJECTION 61% COMPARISON:  04/13/2016 and 03/16/2008 CT abdomen and pelvis FINDINGS: CT CHEST FINDINGS Cardiovascular: Stable cardiomegaly without pericardial effusion. New since the unenhanced exam of 04/13/2016 is a thoracic aortic dissection that is  definitive from aortic arch at or just past the brachiocephalic artery to the distal descending thoracic aorta to the level of the diaphragm. What is indeterminate unfortunately is a whether there is an ascending aortic component due to motion. Vascular surgery consultation is recommended. Would have a low threshold for repeat imaging, with cardiac gating that may help solve this dilemma. Much of the dissection from the arch distal is thrombosed, with probable fenestration causing opacification on both sides of the dissection along the descending aorta. No mediastinal hematoma. No aneurysm. No large central pulmonary embolus. Coronary arteriosclerosis. Mediastinum/Nodes: No adenopathy. Normal appearance of the thoracic esophagus. Trachea is patent. Lungs/Pleura: Small nodular densities measuring up to 7 mm are identified predominantly on the right and in the upper lobe. Study is somewhat limited to motion artifacts. Some the nodular densities may represent branch points for pulmonary vessels especially in the lower right upper lobe adjacent to the minor fissure. Dependent atelectasis noted. Musculoskeletal: Multiple chronic thoracolumbar compression fractures, with slight loss of height at T3 since prior exam but stable along the remainder to at least the L1 level. Chronic fractures involve T3 and T6 through L1. Go CT ABDOMEN PELVIS FINDINGS Hepatobiliary: 3 cm indeterminate hypodense lesion of the right hepatic lobe with Hounsfield units not definitive for simple cyst but given lack of significant change from 2009 is likely benign finding possibly complex cyst. No biliary dilatation. No solid enhancing mass lesions seen. Pancreas: No pancreatic mass or ductal dilatation. Spleen: No splenomegaly or focal mass.  No splenic laceration. Adrenals/Urinary Tract: Unremarkable adrenal glands. No obstructive uropathy or mass. Physiologic distention of the bladder. Stomach/Bowel: No acute inflammation or bowel obstruction.  Appendectomy by report. Vascular/Lymphatic: Aortic and branch vessel atherosclerosis. No lymphadenopathy. Reproductive: Enlarged prostate with peripheral zone calcifications of the prostate measures approximately 6.1 x 5.2 x 5.2 cm (volume = 86 cm^3) Other: Mild diastases of the rectus muscles. Musculoskeletal: Chronic L3 through L5 compression fractures. Stable L1 moderate compression fractures since prior chest CT dated 04/13/2016. Mild superior endplate compression of L2 since 2009 but incompletely imaged on recent CT. IMPRESSION: 1. 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 What is indeterminate unfortunately is a whether there is an ascending aortic component due to motion. Vascular surgery consultation is recommended. Would have a low threshold for  repeat imaging, with cardiac gating that may help solve this dilemma. Much of the dissection from the arch distal is thrombosed, with probable fenestration causing opacification on both sides of the dissection along the descending aorta. Critical Value/emergent results were called by telephone at the time of interpretation on 09/16/2016 at 8:51 pm to Dr. Ovid Curd PAGE , who verbally acknowledged these results. 2. Relatively stable nodular opacities within the lungs more so on the right since recent comparison study, the largest measuring up to 7 mm. Given relative stability would recommend follow-up in 18-24 months from the initial CT dated 04/13/2016 per Fleischner Society recommendations. 3. Multiple thoracolumbar compression fractures are again noted slightly more accentuated at T3 since prior exam. An L2 compression is also noted which is new since the 2009 CT. 4. Approximately 3 cm right hepatic hypodensity grossly stable since 2009 and likely benign finding not definitively a simple  cyst by Hounsfield unit criteria. Electronically Signed   By: Ashley Royalty M.D.   On: 08/30/2016 20:57    ECG & Cardiac Imaging    Not obtained  Assessment & Plan    Antonio Albany has known Afib on Apixaban and on tikosyn. Now s/p fall and multiple trauma to head and spine. Also with aortic dissection.   Recommendations: None of his home meds are vital thus can be missed. Please hold all meds. Please start on labetalol iv to control BP which is currently elevated to 170. Also will control HR which will be trending up with his missed rate controll agents   Signed, Cristina Gong, MD 09/03/2016, 9:50 PM

## 2016-09-06 NOTE — Progress Notes (Signed)
I reviewed the patient's advance directives, which were brought in by his son-in-law.  Based on the written wishes of the patient, we will make him DNR.  We will continue current therapy and make sure that he is comfortable.    The order is placed into Epic.  Imogene Burn. Georgette Dover, MD, [facility]  General/ Trauma Surgery  08/23/2016 10:03 PM

## 2016-09-06 NOTE — Telephone Encounter (Signed)
Pt fu from call to [facility] from yesterday-checking on patient assistance form-pls call

## 2016-09-06 NOTE — Consult Note (Addendum)
Patient name: Antonio [name] MRN: [id number] DOB: 27-Oct-1928 Sex: male  REASON FOR CONSULT: Thoracic aortic dissection. Consult is from Dr. Georgette Dover.  HPI: Antonio [name] is a 83 y.o. male, who fell carrying groceries up stairs. According to the family he fell approximately 5-6 steps. He sustained neck and head injuries and workup included a CT scan of the chest and abdomen. This showed evidence of a section involving the thoracic aorta. It was not clear from the CT scan if the dissection involving the ascending aorta. Vascular surgery was consult for further evaluation.  I am unable to obtain significant history from the patient. He is hard of hearing. According to the family he had not been complaining of any chest pain, chest pressure, or back pain. His daughter was with him right before his fall and it sounds like he simply fell on the stairs and that the fall was not related to some type of a cardiac event or loss of consciousness.  The patient does have a history of atrial fibrillation and is on Eliquis.  Past Medical History:  Diagnosis Date  . Anemia   . Appendicitis    SBP 7/09 ruputre w/ RLQ abscess 03/16/08  . Atrial fibrillation ([place])   . Basal cell carcinoma of nose 2016   Right side nasal tip, [name]  . BPH (benign prostatic hyperplasia) 06/29/2011  . Cardiomyopathy    secondary to Tachycardia. A-Acute on chronic admit for CHF 6/08. B- CHF coupled with atrial fib RVR electrophysiology study 09/27/07.  A- Multiple flutters and Atrial fib- no ablation performed b- ibutilide cardoversion c- amiordarone load- d/c in sinus rhyrthm d- coumadin anticoagulation- stopped at time of GI bleed 11/09  . Glaucoma   . History of blood transfusion 2009   "related to rupture in esophagus" (04/13/2016)  . Hyperlipidemia   . Hypertension   . Knee fracture, right 08/2014   "no OR" (04/13/2016)  . Lung nodules    [name] 04/13/2016  . Mallory - Weiss tear    esophagitis, 6 unit hemorrhage- EPI  injections   . Small bowel obstruction     Family History  Problem Relation Age of Onset  . Uterine cancer Mother   . Heart attack Maternal Uncle   . Heart disease Maternal Uncle   . Colon cancer Neg Hx   . Prostate cancer Neg Hx   . Diabetes Neg Hx     SOCIAL HISTORY: Social History   Social History  . Marital status: Married    Spouse name: N/A  . Number of children: 3  . Years of education: N/A   Occupational History  . retired --Catering manager     works part time   Social History Main Topics  . Smoking status: Former Smoker    Packs/day: 1.00    Years: 23.00    Quit date: 06/30/1971  . Smokeless tobacco: Never Used  . Alcohol use No  . Drug use: No  . Sexual activity: No   Other Topics Concern  . Not on file   Social History Narrative   Household-- pt and wife (wife developing dementia)   One son lives in [place], goes there from time to time              Allergies  Allergen Reactions  . Amiodarone Other (See Comments)    Thyroid toxicity  . Fentanyl Rash    Possible source of a reaction he has experienced (given Benadryl to counteract)  . Kemp.Albe [  Prothrombin Complex Conc Human] Rash    Possible source of a reaction he has experienced (given Benadryl to counteract)    Current Facility-Administered Medications  Medication Dose Route Frequency Provider Last Rate Last Dose  . labetalol (NORMODYNE,TRANDATE) injection 5 mg  5 mg Intravenous Q1H PRN Donnie Mesa, MD   5 mg at 08/27/2016 2132    REVIEW OF SYSTEMS:  [X]  denotes positive finding, [ ]  denotes negative finding Cardiac  Comments:  Chest pain or chest pressure:    Shortness of breath upon exertion:    Short of breath when lying flat:    Irregular heart rhythm: X History of atrial fibrillation       Vascular    Pain in calf, thigh, or hip brought on by ambulation: X   Pain in feet at night that wakes you up from your sleep:     Blood clot in your veins:    Leg swelling:  X         Pulmonary    Oxygen at home:    Productive cough:     Wheezing:         Neurologic    Sudden weakness in arms or legs:     Sudden numbness in arms or legs:     Sudden onset of difficulty speaking or slurred speech:    Temporary loss of vision in one eye:     Problems with dizziness:         Gastrointestinal    Blood in stool:     Vomited blood:         Genitourinary    Burning when urinating:     Blood in urine:        Psychiatric    Major depression:         Hematologic    Bleeding problems:    Problems with blood clotting too easily:        Skin    Rashes or ulcers:        Constitutional    Fever or chills:      PHYSICAL EXAM: Vitals:   09/11/2016 2115 09/21/2016 2130 08/31/2016 2145 09/09/2016 2147  BP: (!) 183/105 (!) 160/134 (!) 172/111 165/99  Pulse: 100  97 95  Resp:    (!) 27  Temp:      TempSrc:      SpO2: 97%  91% 91%  Weight:      Height:        GENERAL: The patient is a well-nourished male, in no acute distress. The vital signs are documented above. CARDIAC: There is a regular rate and rhythm.  VASCULAR: I do not detect carotid bruits although it is difficult to assess with his cervical collar in place. He has palpable femoral and popliteal pulses bilaterally. He has diminished but palpable pedal pulses bilaterally. He has brisk Doppler signals in the dorsalis pedis and posterior tibial positions bilaterally. PULMONARY: There is good air exchange bilaterally without wheezing or rales. ABDOMEN: Soft and non-tender with normal pitched bowel sounds.  MUSCULOSKELETAL: There are no major deformities or cyanosis. NEUROLOGIC: No focal weakness or paresthesias are detected. SKIN: There are no ulcers or rashes noted. PSYCHIATRIC: The patient has a normal affect.  DATA:   ECHO: His echo on 04/11/2016 showed an EF of 55-60%. The aorta was noted to be normal at that time.  CT HEAD: CT of the head shows multiple skull fractures. In addition he has a 5 mm left  lateral epidural versus subdural  hematoma. He has a larger right subdural hematoma. There is also bilateral subarachnoid hemorrhages.  CT CERVICAL SPINE: This shows a severe C2 fracture. There is a C2 level epidural hematoma with some spinal stenosis.  CT CHEST AND ABDOMEN: This shows a thoracic aortic dissection. It is difficult to determine exactly where this originates. The dissection ends at the diaphragm.  MEDICAL ISSUES:   THORACIC AORTIC DISSECTION: This patient has a thoracic aortic dissection. He fell 5-6 steps so it seems less likely that this is a traumatic injury although given his age this might be possible. Regardless, even if this is a traumatic dissection he is not a candidate for intervention at this time given his intracranial bleed and also C2 fracture with epidural hematoma. In addition, he is 83 years old and quite debilitated. I think it's certainly possible that this is a chronic dissection in which case this would simply be followed and treated with blood pressure control. He is clearly not a candidate for surgery.   Will be available as needed.   Deitra Mayo [facility] [phone]

## 2016-09-06 NOTE — ED Triage Notes (Signed)
Pt to ER by [facility] from home after falling while carrying groceries up the stairs, fell backwards hitting his posterior head, patient is on eliquis for atrial fibrillation. On arrival patient is lethargic, confused. Per EMS patient was answering questions appropriately in the truck. VSS. Hematoma present to left posterior head. CBG 129.

## 2016-09-06 NOTE — Consult Note (Signed)
Reason for Consult: Close head injury, cervical spine fracture, status post fall Referring Physician: Dr. Lulu Riding  Antonio [name] is an 83 y.o. male.  HPI: Patient is an 83 year old individual who had a fall down several stairs today. He hit his face suffered some lacerations and abrasions about his scalp and complained of significant neck pain. He came to the emergency department or CT scan of the head was performed this demonstrates presence of subarachnoid blood over the convex [place] on the left side and small subdural hematomas bilaterally. The patient is anticoagulated for atrial fibrillation. He's had a history of CHF. A CT scan of the C-spine was performed and this demonstrates presence of a complex C2 fracture with both impaction of the C2 vertebrae anteriorly and posteriorly. The alignment of his vertebrae in the canal appear to be intact. While in the emergency room the patient is becoming progressively more agitated. Discussion was had with the patient's wife and her daughter regarding what aggressive measures like to undertake.  Past Medical History:  Diagnosis Date  . Anemia   . Appendicitis    SBP 7/09 ruputre w/ RLQ abscess 03/16/08  . Atrial fibrillation ([place])   . Basal cell carcinoma of nose 2016   Right side nasal tip, [name]  . BPH (benign prostatic hyperplasia) 06/29/2011  . Cardiomyopathy    secondary to Tachycardia. A-Acute on chronic admit for CHF 6/08. B- CHF coupled with atrial fib RVR electrophysiology study 09/27/07.  A- Multiple flutters and Atrial fib- no ablation performed b- ibutilide cardoversion c- amiordarone load- d/c in sinus rhyrthm d- coumadin anticoagulation- stopped at time of GI bleed 11/09  . Glaucoma   . History of blood transfusion 2009   "related to rupture in esophagus" (04/13/2016)  . Hyperlipidemia   . Hypertension   . Knee fracture, right 08/2014   "no OR" (04/13/2016)  . Lung nodules    Antonio [name] 04/13/2016  . Mallory - Weiss tear     esophagitis, 6 unit hemorrhage- EPI injections   . Small bowel obstruction     Past Surgical History:  Procedure Laterality Date  . APPENDECTOMY  02/2008   exploratory, ruptured appendix, RLQ abscess  . BASAL CELL CARCINOMA EXCISION     nose  . CATARACT EXTRACTION W/ INTRAOCULAR LENS  IMPLANT, BILATERAL Bilateral   . COLONOSCOPY    . COLONOSCOPY W/ BIOPSIES AND POLYPECTOMY  ~ 2015  . EP study  09/27/07   see pmh  . EXPLORATORY LAPAROTOMY  02/2008  . HEMORRHOID SURGERY    . TEE WITHOUT CARDIOVERSION N/A 02/08/2016   Procedure: TRANSESOPHAGEAL ECHOCARDIOGRAM (TEE);  Surgeon: Fay Records, MD;  Location: [facility] ENDOSCOPY;  Service: Cardiovascular;  Laterality: N/A;  . TEE WITHOUT CARDIOVERSION N/A 04/11/2016   Procedure: TRANSESOPHAGEAL ECHOCARDIOGRAM (TEE);  Surgeon: Josue Hector, MD;  Location: [facility] ENDOSCOPY;  Service: Cardiovascular;  Laterality: N/A;  . TEE/DCCV  02/07/07    Family History  Problem Relation Age of Onset  . Uterine cancer Mother   . Heart attack Maternal Uncle   . Heart disease Maternal Uncle   . Colon cancer Neg Hx   . Prostate cancer Neg Hx   . Diabetes Neg Hx     Social History:  reports that he quit smoking about 45 years ago. He has a 23.00 pack-year smoking history. He has never used smokeless tobacco. He reports that he does not drink alcohol or use drugs.  Allergies:  Allergies  Allergen Reactions  . Amiodarone Other (See Comments)  Thyroid toxicity    Medications: I have not reviewed the patient's medication list  Results for orders placed or performed during the hospital encounter of 09/15/2016 (from the past 48 hour(s))  Comprehensive metabolic panel     Status: Abnormal   Collection Time: 08/26/2016  6:33 PM  Result Value Ref Range   Sodium 138 135 - 145 mmol/L   Potassium 3.4 (L) 3.5 - 5.1 mmol/L   Chloride 104 101 - 111 mmol/L   CO2 21 (L) 22 - 32 mmol/L   Glucose, Bld 120 (H) 65 - 99 mg/dL   BUN 17 6 - 20 mg/dL   Creatinine, Ser 0.99 0.61 -  1.24 mg/dL   Calcium 9.6 8.9 - 10.3 mg/dL   Total Protein 7.2 6.5 - 8.1 g/dL   Albumin 3.9 3.5 - 5.0 g/dL   AST 48 (H) 15 - 41 U/L   ALT 43 17 - 63 U/L   Alkaline Phosphatase 116 38 - 126 U/L   Total Bilirubin 0.6 0.3 - 1.2 mg/dL   GFR calc non Af Amer >60 >60 mL/min   GFR calc Af Amer >60 >60 mL/min    Comment: (NOTE) The eGFR has been calculated using the CKD EPI equation. This calculation has not been validated in all clinical situations. eGFR's persistently <60 mL/min signify possible Chronic Kidney Disease.    Anion gap 13 5 - 15  CBC     Status: Abnormal   Collection Time: 09/12/2016  6:33 PM  Result Value Ref Range   WBC 15.1 (H) 4.0 - 10.5 K/uL   RBC 4.65 4.22 - 5.81 MIL/uL   Hemoglobin 14.8 13.0 - 17.0 g/dL   HCT 44.0 39.0 - 52.0 %   MCV 94.6 78.0 - 100.0 fL   MCH 31.8 26.0 - 34.0 pg   MCHC 33.6 30.0 - 36.0 g/dL   RDW 13.2 11.5 - 15.5 %   Platelets 251 150 - 400 K/uL  Protime-INR     Status: None   Collection Time: 09/02/2016  6:33 PM  Result Value Ref Range   Prothrombin Time 14.2 11.4 - 15.2 seconds   INR 1.09   I-Stat Chem 8, ED     Status: Abnormal   Collection Time: 09/10/2016  7:18 PM  Result Value Ref Range   Sodium 140 135 - 145 mmol/L   Potassium 3.2 (L) 3.5 - 5.1 mmol/L   Chloride 103 101 - 111 mmol/L   BUN 21 (H) 6 - 20 mg/dL   Creatinine, Ser 1.00 0.61 - 1.24 mg/dL   Glucose, Bld 122 (H) 65 - 99 mg/dL   Calcium, Ion 1.13 (L) 1.15 - 1.40 mmol/L   TCO2 25 0 - 100 mmol/L   Hemoglobin 15.0 13.0 - 17.0 g/dL   HCT 44.0 39.0 - 52.0 %  I-Stat CG4 Lactic Acid, ED     Status: Abnormal   Collection Time: 09/07/2016  7:18 PM  Result Value Ref Range   Lactic Acid, Venous 4.69 (HH) 0.5 - 1.9 mmol/L   Comment NOTIFIED PHYSICIAN   Ethanol     Status: None   Collection Time: 08/31/2016  7:30 PM  Result Value Ref Range   Alcohol, Ethyl (B) <5 <5 mg/dL    Comment:        LOWEST DETECTABLE LIMIT FOR SERUM ALCOHOL IS 5 mg/dL FOR MEDICAL PURPOSES ONLY     Ct Head  Wo Contrast  Result Date: 09/13/2016 CLINICAL DATA:  83 year old male status post fall backwards while caring groceries up  stairs. On anti coagulation for a fib. Lethargic and confused on arrival. Initial encounter. EXAM: CT HEAD WITHOUT CONTRAST CT CERVICAL SPINE WITHOUT CONTRAST TECHNIQUE: Multidetector CT imaging of the head and cervical spine was performed following the standard protocol without intravenous contrast. Multiplanar CT image reconstructions of the cervical spine were also generated. COMPARISON:  Cervical spine radiographs 07/08/2016. FINDINGS: CT HEAD FINDINGS Brain: Small to moderate volume subarachnoid hemorrhage throughout the left convexity. There is a 5 mm hyperdense extra-axial hemorrhage along the left lateral convexity which could be subdural or a small epidural hematoma. There is a contralateral right parafalcine and tentorial subdural hematoma measuring up to 10 mm in thickness, predominantly posterior. This right side subdural hematoma also wraps around the inferior and lateral right hemisphere measuring up to 5 mm in thickness laterally and superiorly. Superimposed trace right vertex subarachnoid hemorrhage. No basilar cistern subarachnoid hemorrhage. No intraventricular hemorrhage or ventriculomegaly. No midline shift at this time. Basilar cisterns remain patent. No posterior fossa hemorrhage. No cortically based acute infarct identified. Vascular: Calcified atherosclerosis at the skull base. Skull: Osteopenia. Non depressed left sphenoid wing and squamous ule anterior temporal bone skull fracture. This tracks cephalad and is mildly comminuted toward the vertex. The fracture appears to terminated along the lateral aspect of the left middle cranial fossa. There is a nearby a nondisplaced left zygomatic arch fracture. No more central skullbase fracture is identified. However, there is also a nondisplaced right zygomatic arch fracture as well as a mildly displaced right lateral orbital  wall fracture (series 202 images 13 in 22). There might be a trace amount of right intraorbital contusion (image 25). No other orbital wall fracture. Sinuses/Orbits: Small fluid levels in the left sphenoid sinus which appear possibly hyperdense such as related to hemorrhage. Similar fluid level in the posterior left ethmoid air cell. Small lower density fluid level in the right maxillary sinus with alveolar recess mucosal thickening associated. Mild opacification of the and appear ear left mastoid air cells. The left tympanic cavity and remaining mastoids are clear. The right tympanic cavity and right mastoids are clear. Other: Large broad-based left scalp hematoma measuring up to 12 mm in thickness. Other scalp soft tissues appear normal. Negative orbits soft tissues. Negative visualized deep soft tissue spaces of the face except for a small volume of gas in the left masticator space. CT CERVICAL SPINE FINDINGS Osteopenia. Occipital condyles -C1 alignment is preserved. 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 There is associated disruption of the right C2-C3 facet joint, with an avulsed fragment off of the posterior C3 facet (sagittal image 29). There is subsequent mild retrolisthesis of the right side body of C2 on C3. There is associated epidural hemorrhage. There is associated C2 level spinal stenosis which is probably mild. The C3 body and pedicles are intact. There is a mildly displaced C3 spinous process fracture in addition to the small avulsion from the posterior right facet. The C4 vertebra is intact.  The C5 vertebra is intact. The C6 vertebra appears intact. Possible  mild compression fracture of the C7 superior endplate. There are questionable nondisplaced fractures through the bilateral C7 superior articulating facet - but this is probably artifact due to osteopenia. There  is mild to moderate compression of the T3 vertebral body. There is a minimally displaced fracture of the posterior left first rib. There is a comminuted fracture of the right mid clavicle. The other visualized upper thoracic levels appear grossly intact. Negative lung apices.  No cervical prevertebral fluid. IMPRESSION: CT HEAD: 1. Comminuted non-depressed left frontotemporal skull fracture. Bilateral zygomatic arch fractures. Right lateral orbital wall fracture. 2. Five mm left lateral epidural versus subdural hematoma. Right subdural hematoma with 10 mm parafalcine and 5 mm peripheral components. 3. Left greater than right subarachnoid hemorrhage. 4. No midline shift at this time. No intraventricular hemorrhage or ventriculomegaly. No hemorrhagic cerebral contusion identified at this time. 5. Small volume of hemorrhage in the sphenoid sinuses but no central skullbase fracture identified. CT CERVICAL SPINE: 1. Severe C2 fracture is impacted, comminuted, and displaced. Associated disruption of the right C2-C3 facet joint. Subsequent C2 level epidural hematoma and multifactorial spinal stenosis (probably mild at this time). 2. Small avulsion fractures of the posterior right C3 facet and the C3 spinous process. 3. Suspected mild compression fracture of the C7 superior endplate. Questionable nondisplaced bilateral C7 superior articulating facet fractures - but favor artifact due to osteopenia instead. 4. Age indeterminate compression of the T3 vertebral body. 5. Mildly displaced posterior left first rib fracture. Critical Value/emergent results were called by telephone at the time of interpretation on 09/05/2016 at 1954 hours to Dr. Ellender Hose , who verbally acknowledged these results. Electronically Signed   By: Genevie Ann M.D.   On: 09/21/2016 20:09   Ct Chest W Contrast  Result Date: 08/29/2016 CLINICAL DATA:  Pain after fall EXAM: CT CHEST, ABDOMEN, AND PELVIS WITH CONTRAST TECHNIQUE: Multidetector CT imaging of the chest, abdomen and pelvis was performed following the standard protocol during bolus administration of intravenous contrast. CONTRAST:  117m ISOVUE-300 IOPAMIDOL (ISOVUE-300) INJECTION 61% COMPARISON:  04/13/2016 and 03/16/2008 CT abdomen and pelvis FINDINGS: CT CHEST FINDINGS Cardiovascular: Stable cardiomegaly without pericardial effusion. 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 What is indeterminate unfortunately is a whether there is an ascending aortic component due to motion. Vascular surgery consultation is recommended. Would have a low threshold for repeat imaging, with cardiac gating that may help solve this dilemma. Much of the dissection from the arch distal is thrombosed, with probable fenestration causing opacification on both sides of the dissection along the descending aorta. No mediastinal hematoma. No aneurysm. No large central pulmonary embolus. Coronary arteriosclerosis. Mediastinum/Nodes: No adenopathy. Normal appearance of the thoracic esophagus. Trachea is patent. Lungs/Pleura: Small nodular densities measuring up to 7 mm are identified predominantly on the right and in the upper lobe. Study is somewhat limited to motion artifacts. Some the nodular densities may represent branch points for pulmonary vessels especially in the lower right upper lobe adjacent to the minor fissure. Dependent atelectasis noted. Musculoskeletal: Multiple chronic thoracolumbar compression fractures, with slight loss of height at T3 since prior exam but stable along the remainder to at least the L1 level. Chronic fractures involve  T3 and T6 through L1. Go CT ABDOMEN PELVIS FINDINGS Hepatobiliary: 3 cm indeterminate hypodense lesion of the right hepatic lobe with Hounsfield units not definitive for simple cyst but given lack of significant change from 2009 is likely benign finding possibly complex cyst. No biliary dilatation. No solid enhancing mass lesions seen. Pancreas: No  pancreatic mass or ductal dilatation. Spleen: No splenomegaly or focal mass.  No splenic laceration. Adrenals/Urinary Tract: Unremarkable adrenal glands. No obstructive uropathy or mass. Physiologic distention of the bladder. Stomach/Bowel: No acute inflammation or bowel obstruction. Appendectomy by report. Vascular/Lymphatic: Aortic and branch vessel atherosclerosis. No lymphadenopathy. Reproductive: Enlarged prostate with peripheral zone calcifications of the prostate measures approximately 6.1 x 5.2 x 5.2 cm (volume = 86 cm^3) Other: Mild diastases of the rectus muscles. Musculoskeletal: Chronic L3 through L5 compression fractures. Stable L1 moderate compression fractures since prior chest CT dated 04/13/2016. Mild superior endplate compression of L2 since 2009 but incompletely imaged on recent CT. IMPRESSION: 1. 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 What is indeterminate unfortunately is a whether there is an ascending aortic component due to motion. Vascular surgery consultation is recommended. Would have a low threshold for repeat imaging, with cardiac gating that may help solve this dilemma. Much of the dissection from the arch distal is thrombosed, with probable fenestration causing opacification on both sides of the dissection along the descending aorta. Critical Value/emergent results were called by telephone at the time of interpretation on 09/19/2016 at 8:51 pm to Dr. Ovid Curd  PAGE , who verbally acknowledged these results. 2. Relatively stable nodular opacities within the lungs more so on the right since recent comparison study, the largest measuring up to 7 mm. Given relative stability would recommend follow-up in 18-24 months from the initial CT dated 04/13/2016 per Fleischner Society recommendations. 3. Multiple thoracolumbar compression fractures are again noted slightly more accentuated at T3 since prior exam. An L2 compression is also noted which is new since the 2009 CT. 4. Approximately 3 cm right hepatic hypodensity grossly stable since 2009 and likely benign finding not definitively a simple cyst by Hounsfield unit criteria. Electronically Signed   By: Ashley Royalty M.D.   On: 09/12/2016 20:57   Ct Cervical Spine Wo Contrast  Result Date: 09/10/2016 CLINICAL DATA:  83 year old male status post fall backwards while caring groceries up stairs. On anti coagulation for a fib. Lethargic and confused on arrival. Initial encounter. EXAM: CT HEAD WITHOUT CONTRAST CT CERVICAL SPINE WITHOUT CONTRAST TECHNIQUE: Multidetector CT imaging of the head and cervical spine was performed following the standard protocol without intravenous contrast. Multiplanar CT image reconstructions of the cervical spine were also generated. COMPARISON:  Cervical spine radiographs 07/08/2016. FINDINGS: CT HEAD FINDINGS Brain: Small to moderate volume subarachnoid hemorrhage throughout the left convexity. There is a 5 mm hyperdense extra-axial hemorrhage along the left lateral convexity which could be subdural or a small epidural hematoma. There is a contralateral right parafalcine and tentorial subdural hematoma measuring up to 10 mm in thickness, predominantly posterior. This right side subdural hematoma also wraps around the inferior and lateral right hemisphere measuring up to 5 mm in thickness laterally and superiorly. Superimposed trace right vertex subarachnoid hemorrhage. No basilar cistern  subarachnoid hemorrhage. No intraventricular hemorrhage or ventriculomegaly. No midline shift at this time. Basilar cisterns remain patent. No posterior fossa hemorrhage. No cortically based acute infarct identified. Vascular: Calcified atherosclerosis at the skull base. Skull: Osteopenia. Non depressed left sphenoid wing and squamous ule anterior temporal bone skull fracture. This tracks cephalad and is mildly comminuted toward the vertex. The fracture appears to terminated along the lateral aspect of the left middle cranial fossa. There is a  nearby a nondisplaced left zygomatic arch fracture. No more central skullbase fracture is identified. However, there is also a nondisplaced right zygomatic arch fracture as well as a mildly displaced right lateral orbital wall fracture (series 202 images 13 in 22). There might be a trace amount of right intraorbital contusion (image 25). No other orbital wall fracture. Sinuses/Orbits: Small fluid levels in the left sphenoid sinus which appear possibly hyperdense such as related to hemorrhage. Similar fluid level in the posterior left ethmoid air cell. Small lower density fluid level in the right maxillary sinus with alveolar recess mucosal thickening associated. Mild opacification of the and appear ear left mastoid air cells. The left tympanic cavity and remaining mastoids are clear. The right tympanic cavity and right mastoids are clear. Other: Large broad-based left scalp hematoma measuring up to 12 mm in thickness. Other scalp soft tissues appear normal. Negative orbits soft tissues. Negative visualized deep soft tissue spaces of the face except for a small volume of gas in the left masticator space. CT CERVICAL SPINE FINDINGS Osteopenia. Occipital condyles -C1 alignment is preserved. And anterior C1 ring -odontoid alignment is preserved (probably due to ankylosis However, there is a severe C2 vertebral fracture including comminuted and impacted fractures of the C2 body  (sagittal image 41), comminuted and displaced fractures of the posterior right C2 arch and spinous process, and comminuted fractures of the right C2 lateral mass and right C2 transverse process. There is associated disruption of the right C2-C3 facet joint, with an avulsed fragment off of the posterior C3 facet (sagittal image 29). There is subsequent mild retrolisthesis of the right side body of C2 on C3. There is associated epidural hemorrhage. There is associated C2 level spinal stenosis which is probably mild. The C3 body and pedicles are intact. There is a mildly displaced C3 spinous process fracture in addition to the small avulsion from the posterior right facet. The C4 vertebra is intact.  The C5 vertebra is intact. The C6 vertebra appears intact. Possible mild compression fracture of the C7 superior endplate. There are questionable nondisplaced fractures through the bilateral C7 superior articulating facet - but this is probably artifact due to osteopenia. There is mild to moderate compression of the T3 vertebral body. There is a minimally displaced fracture of the posterior left first rib. There is a comminuted fracture of the right mid clavicle. The other visualized upper thoracic levels appear grossly intact. Negative lung apices.  No cervical prevertebral fluid. IMPRESSION: CT HEAD: 1. Comminuted non-depressed left frontotemporal skull fracture. Bilateral zygomatic arch fractures. Right lateral orbital wall fracture. 2. Five mm left lateral epidural versus subdural hematoma. Right subdural hematoma with 10 mm parafalcine and 5 mm peripheral components. 3. Left greater than right subarachnoid hemorrhage. 4. No midline shift at this time. No intraventricular hemorrhage or ventriculomegaly. No hemorrhagic cerebral contusion identified at this time. 5. Small volume of hemorrhage in the sphenoid sinuses but no central skullbase fracture identified. CT CERVICAL SPINE: 1. Severe C2 fracture is impacted,  comminuted, and displaced. Associated disruption of the right C2-C3 facet joint. Subsequent C2 level epidural hematoma and multifactorial spinal stenosis (probably mild at this time). 2. Small avulsion fractures of the posterior right C3 facet and the C3 spinous process. 3. Suspected mild compression fracture of the C7 superior endplate. Questionable nondisplaced bilateral C7 superior articulating facet fractures - but favor artifact due to osteopenia instead. 4. Age indeterminate compression of the T3 vertebral body. 5. Mildly displaced posterior left first rib fracture. Critical Value/emergent results  were called by telephone at the time of interpretation on 08/28/2016 at 1954 hours to Dr. Ellender Hose , who verbally acknowledged these results. Electronically Signed   By: Genevie Ann M.D.   On: 09/09/2016 20:09   Ct Abdomen Pelvis W Contrast  Result Date: 08/30/2016 CLINICAL DATA:  Pain after fall EXAM: CT CHEST, ABDOMEN, AND PELVIS WITH CONTRAST TECHNIQUE: Multidetector CT imaging of the chest, abdomen and pelvis was performed following the standard protocol during bolus administration of intravenous contrast. CONTRAST:  137m ISOVUE-300 IOPAMIDOL (ISOVUE-300) INJECTION 61% COMPARISON:  04/13/2016 and 03/16/2008 CT abdomen and pelvis FINDINGS: CT CHEST FINDINGS Cardiovascular: Stable cardiomegaly without pericardial effusion. 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 What is indeterminate unfortunately is a whether there is an ascending aortic component due to motion. Vascular surgery consultation is recommended. Would have a low threshold for repeat imaging, with cardiac gating that may help solve this dilemma. Much of the dissection from the arch distal is thrombosed, with probable fenestration causing opacification on both sides of the  dissection along the descending aorta. No mediastinal hematoma. No aneurysm. No large central pulmonary embolus. Coronary arteriosclerosis. Mediastinum/Nodes: No adenopathy. Normal appearance of the thoracic esophagus. Trachea is patent. Lungs/Pleura: Small nodular densities measuring up to 7 mm are identified predominantly on the right and in the upper lobe. Study is somewhat limited to motion artifacts. Some the nodular densities may represent branch points for pulmonary vessels especially in the lower right upper lobe adjacent to the minor fissure. Dependent atelectasis noted. Musculoskeletal: Multiple chronic thoracolumbar compression fractures, with slight loss of height at T3 since prior exam but stable along the remainder to at least the L1 level. Chronic fractures involve T3 and T6 through L1. Go CT ABDOMEN PELVIS FINDINGS Hepatobiliary: 3 cm indeterminate hypodense lesion of the right hepatic lobe with Hounsfield units not definitive for simple cyst but given lack of significant change from 2009 is likely benign finding possibly complex cyst. No biliary dilatation. No solid enhancing mass lesions seen. Pancreas: No pancreatic mass or ductal dilatation. Spleen: No splenomegaly or focal mass.  No splenic laceration. Adrenals/Urinary Tract: Unremarkable adrenal glands. No obstructive uropathy or mass. Physiologic distention of the bladder. Stomach/Bowel: No acute inflammation or bowel obstruction. Appendectomy by report. Vascular/Lymphatic: Aortic and branch vessel atherosclerosis. No lymphadenopathy. Reproductive: Enlarged prostate with peripheral zone calcifications of the prostate measures approximately 6.1 x 5.2 x 5.2 cm (volume = 86 cm^3) Other: Mild diastases of the rectus muscles. Musculoskeletal: Chronic L3 through L5 compression fractures. Stable L1 moderate compression fractures since prior chest CT dated 04/13/2016. Mild superior endplate compression of L2 since 2009 but incompletely imaged on  recent CT. IMPRESSION: 1. 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 What is indeterminate unfortunately is a whether there is an ascending aortic component due to motion. Vascular surgery consultation is recommended. Would have a low threshold for repeat imaging, with cardiac gating that may help solve this dilemma. Much of the dissection from the arch distal is thrombosed, with probable fenestration causing opacification on both sides of the dissection along the descending aorta. Critical Value/emergent results were called by telephone at the time of interpretation on 08/23/2016  at 8:51 pm to Dr. Ovid Curd PAGE , who verbally acknowledged these results. 2. Relatively stable nodular opacities within the lungs more so on the right since recent comparison study, the largest measuring up to 7 mm. Given relative stability would recommend follow-up in 18-24 months from the initial CT dated 04/13/2016 per Fleischner Society recommendations. 3. Multiple thoracolumbar compression fractures are again noted slightly more accentuated at T3 since prior exam. An L2 compression is also noted which is new since the 2009 CT. 4. Approximately 3 cm right hepatic hypodensity grossly stable since 2009 and likely benign finding not definitively a simple cyst by Hounsfield unit criteria. Electronically Signed   By: Ashley Royalty M.D.   On: 09/03/2016 20:57    ROS Blood pressure 181/99, pulse 99, temperature 97.8 F (36.6 C), temperature source Oral, resp. rate 21, height '6\' 4"'$  (1.93 m), weight 116.6 kg (257 lb 0.9 oz), SpO2 95 %. Physical Exam  Assessment/Plan: Acute subdural hematoma with subarachnoid hemorrhage. Patient has a moderate amount of atrophy is significant room intracranially. He is on anticoagulation and this needs to be stopped. The patient  also has a C2 fracture. He can be immobilized in a hard cervical collar. Best treatment at this point would be to wait and watch. I discussed the situation with the patient's wife and daughter. They do have some concerns as to what to do should the patient deteriorate clinically and they voiced some concern that he would not like to be intubated.  Audreanna Torrisi J 09/07/2016, 9:06 PM

## 2016-09-06 NOTE — ED Notes (Signed)
Pt more restless, worsening swelling to R ear. Pt able to use urinal with assistance, requesting to stand up. Pt remains confused, does follow commands

## 2016-09-07 DIAGNOSIS — I10 Essential (primary) hypertension: Secondary | ICD-10-CM

## 2016-09-07 DIAGNOSIS — I480 Paroxysmal atrial fibrillation: Secondary | ICD-10-CM

## 2016-09-07 DIAGNOSIS — I7103 Dissection of thoracoabdominal aorta: Secondary | ICD-10-CM

## 2016-09-07 LAB — CBC
HCT: 39.5 % (ref 39.0–52.0)
Hemoglobin: 13.4 g/dL (ref 13.0–17.0)
MCH: 31.5 pg (ref 26.0–34.0)
MCHC: 33.9 g/dL (ref 30.0–36.0)
MCV: 92.9 fL (ref 78.0–100.0)
PLATELETS: 165 10*3/uL (ref 150–400)
RBC: 4.25 MIL/uL (ref 4.22–5.81)
RDW: 13.1 % (ref 11.5–15.5)
WBC: 11.9 10*3/uL — ABNORMAL HIGH (ref 4.0–10.5)

## 2016-09-07 LAB — BASIC METABOLIC PANEL
Anion gap: 14 (ref 5–15)
BUN: 13 mg/dL (ref 6–20)
CALCIUM: 9.1 mg/dL (ref 8.9–10.3)
CO2: 18 mmol/L — ABNORMAL LOW (ref 22–32)
CREATININE: 1.03 mg/dL (ref 0.61–1.24)
Chloride: 103 mmol/L (ref 101–111)
GFR calc Af Amer: >60 mL/min (ref >60)
Glucose, Bld: 191 mg/dL — ABNORMAL HIGH (ref 65–99)
POTASSIUM: 4.1 mmol/L (ref 3.5–5.1)
SODIUM: 135 mmol/L (ref 135–145)

## 2016-09-07 LAB — LACTIC ACID, PLASMA: Lactic Acid, Venous: 4.6 mmol/L (ref 0.5–1.9)

## 2016-09-07 LAB — MAGNESIUM: Magnesium: 1.5 mg/dL — ABNORMAL LOW (ref 1.7–2.4)

## 2016-09-07 MED ORDER — LABETALOL HCL 5 MG/ML IV SOLN
0.5000 mg/min | INTRAVENOUS | Status: DC
  Administered 2016-09-07: 1.5 mg/min via INTRAVENOUS
  Administered 2016-09-07: 0.5 mg/min via INTRAVENOUS
  Filled 2016-09-07 (×2): qty 100

## 2016-09-07 NOTE — Progress Notes (Signed)
Patient ID: Antonio [name], male   DOB: 1929/03/18, 83 y.o.   MRN: [id number] Vital signs show hypertension but otherwise stable. Respiratory status patient is breathing on his own and maintaining. Alertness: Arouses to voice intermittently follows commands but mostly agitated when stimulated Moves all 4 extremities well Requires substantial restraint to prevent from pulling a collar and IV lines. At this point his condition appears stable and I have advised that we'll hold off on repeating a CT scan today and plan on doing this tomorrow morning if his condition remains unchanged. I discussed the situation with family namely the daughter and wife and explained that this will likely be a long drawn out process. At this point we can only offer supportive care.

## 2016-09-07 NOTE — Progress Notes (Signed)
Patient ID: Antonio [name], male   DOB: [birth year], 83 y.o.   MRN: [id number]   LOS: 1 day   Subjective: Less responsive per family.    Objective: Vital signs in last 24 hours: Temp:  [97.5 F (36.4 C)-99.9 F (37.7 C)] 99.9 F (37.7 C) (01/17 0800) Pulse Rate:  [76-114] 100 (01/17 1000) Resp:  [15-34] 19 (01/17 1000) BP: (133-184)/(81-134) 162/93 (01/17 1000) SpO2:  [90 %-100 %] 99 % (01/17 1000) Weight:  [112.9 kg (248 lb 14.4 oz)-116.6 kg (257 lb 0.9 oz)] 112.9 kg (248 lb 14.4 oz) (01/16 2218)    UOP: >132ml/h   Laboratory CBC  Recent Labs  09/11/2016 1833 09/04/2016 1918 09/07/16 0316  WBC 15.1*  --  11.9*  HGB 14.8 15.0 13.4  HCT 44.0 44.0 39.5  PLT 251  --  165   BMET  Recent Labs  09/19/2016 1833 08/28/2016 1918 09/07/16 0316  NA 138 140 135  K 3.4* 3.2* 4.1  CL 104 103 103  CO2 21*  --  18*  GLUCOSE 120* 122* 191*  BUN 17 21* 13  CREATININE 0.99 1.00 1.03  CALCIUM 9.6  --  9.1    Physical Exam General appearance: no distress Resp: clear to auscultation bilaterally Cardio: Tachycardia GI: normal findings: bowel sounds normal and soft, non-tender Pulses: 2+ and symmetric Neuro: PERRL, Ox1 maybe, +FC   Assessment/Plan: Fall  TBI w/SDH, SAH, skull fxs -- per Dr. Ellene Route, TBI team Multiple facial fxs with right auricular hematoma -- Awaiting facial consult C2,3,7 fxs -- Collar per Dr. Ellene Route Left rib fx -- Pulmonary toilet Ao dissection -- non-operative per Drs. Lorel Monaco Multiple medical problems -- Appreciate cardiology consult FEN -- Will need nutrition at some point if family wants to place [place] VTE -- SCD's Dispo -- Will have palliative consult    Lisette Abu, PA-C Pager: [phone] General Trauma PA Pager: [phone]  09/07/2016

## 2016-09-07 NOTE — Progress Notes (Signed)
Visited w/ 3 family members in rm while pt slept, providing spiritual/emotional support and prayer (nurse joined in latter). Family was grateful, esp. for availability of shelter at [place], as those here now are from [facility] and [place], and others will be arriving soon from [place]. Chaplain available for f/u.    09/07/16 1100  Clinical Encounter Type  Visited With Patient and family together;Health care provider  Visit Type Initial;Psychological support;Spiritual support;Social support;Critical Care  Referral From Chaplain  Spiritual Encounters  Spiritual Needs Prayer;Emotional;Grief support  Stress Factors  Patient Stress Factors Health changes;Loss of control  Family Stress Factors Family relationships;Health changes;Loss of control   Gerrit Heck, Chaplain

## 2016-09-07 NOTE — Progress Notes (Signed)
CRITICAL VALUE ALERT  Critical value received:  Lactic acid 4.6   Date of notification:  09/07/16  Time of notification:  1237  Critical value read back:Yes  Nurse who received alert:  Mila Merry  MD notified (1st page):  Silvestre Gunner  Time of first page:  1237  MD notified (2nd page):  Time of second page:  Responding MD:  Silvestre Gunner  Time MD responded:  [phone]

## 2016-09-07 NOTE — Care Management Note (Signed)
Case Management Note  Patient Details  Name: Antonio [name] MRN: [id number] Date of Birth: [birth year]  Subjective/Objective:   Pt admitted on 09/04/2016 s/p fall down stairs with comminuted non-depressed left frontotemporal skull fracture; bilateral zygomatic arch fractures; right lateral orbital wall fracture; left subdural hematoma; right subdural hematoma; bilateral subarachnoid hemorrhage; C2 fracture - impacted, comminuted, displaced; C2-C3 right facet joint disruption;  C2 epidural hematoma; right C3 facet/ C3 spinous process; C7 compression fracture; left posterior first rib fracture, and thoracic aortic dissection.  PTA, pt independent, lives with spouse.    Action/Plan: Patient disoriented with family at bedside.  Awaiting Palliative Consult for Goals of Care.  Will follow/offer support.    Expected Discharge Date:                  Expected Discharge Plan:     In-House Referral:  Clinical Social Work, Education officer, community  CM Consult  Post Acute Care Choice:    Choice offered to:     DME Arranged:    DME Agency:     HH Arranged:    [place] Agency:     Status of Service:  In process, will continue to follow  If discussed at Long Length of Stay Meetings, dates discussed:    Additional Comments:  Reinaldo Raddle, RN, BSN  Trauma/Neuro ICU Case Manager [phone]

## 2016-09-07 NOTE — Progress Notes (Signed)
DAILY PROGRESS NOTE  Subjective:  NPO d/t trauma and confusion. BP remains in the 160's with HR >100. Telemetry shows sinus rhythm with RBBB. Was on metoprolol with tikosyn at home. Tikosyn being held and Eliquis being held.  Objective:  Temp:  [97.5 F (36.4 C)-99.9 F (37.7 C)] 99.9 F (37.7 C) (01/17 0800) Pulse Rate:  [76-114] 100 (01/17 1000) Resp:  [15-34] 19 (01/17 1000) BP: (133-184)/(81-134) 162/93 (01/17 1000) SpO2:  [90 %-100 %] 99 % (01/17 1000) Weight:  [248 lb 14.4 oz (112.9 kg)-257 lb 0.9 oz (116.6 kg)] 248 lb 14.4 oz (112.9 kg) (01/16 2218) Weight change:   Intake/Output from previous day: 01/16 0701 - 01/17 0700 In: 1662.5 [I.V.:1662.5] Out: 1050 [Urine:1050]  Intake/Output from this shift: Total I/O In: 225 [I.V.:225] Out: 300 [Urine:300]  Medications: No current facility-administered medications on file prior to encounter.    Current Outpatient Prescriptions on File Prior to Encounter  Medication Sig Dispense Refill  . apixaban (ELIQUIS) 5 MG TABS tablet Take 5 mg by mouth 2 (two) times daily.    Marland Kitchen diltiazem (CARDIZEM CD) 120 MG 24 hr capsule Take 1 capsule (120 mg total) by mouth daily. 90 capsule 3  . dofetilide (TIKOSYN) 125 MCG capsule Take 375 mcg by mouth 2 (two) times daily.     . furosemide (LASIX) 20 MG tablet Take one tablet (20 mg) by mouth once daily as needed for swelling/ shortness of breath 30 tablet 2  . metoprolol (LOPRESSOR) 50 MG tablet Take 50 mg by mouth 2 (two) times daily.    . Multiple Vitamins-Minerals (CENTRUM) tablet Take 1 tablet by mouth daily.        Physical Exam: General appearance: confused, disoriented, in c-collar Lungs: clear to auscultation bilaterally Heart: regular rate and rhythm and tachycardic Extremities: extremities normal, atraumatic, no cyanosis or edema Neurologic: Mental status: confused, mitts on, in c-collar, C2 spinal injury  Lab Results: Results for orders placed or performed during the  hospital encounter of 08/24/2016 (from the past 48 hour(s))  Comprehensive metabolic panel     Status: Abnormal   Collection Time: 09/16/2016  6:33 PM  Result Value Ref Range   Sodium 138 135 - 145 mmol/L   Potassium 3.4 (L) 3.5 - 5.1 mmol/L   Chloride 104 101 - 111 mmol/L   CO2 21 (L) 22 - 32 mmol/L   Glucose, Bld 120 (H) 65 - 99 mg/dL   BUN 17 6 - 20 mg/dL   Creatinine, Ser 0.99 0.61 - 1.24 mg/dL   Calcium 9.6 8.9 - 10.3 mg/dL   Total Protein 7.2 6.5 - 8.1 g/dL   Albumin 3.9 3.5 - 5.0 g/dL   AST 48 (H) 15 - 41 U/L   ALT 43 17 - 63 U/L   Alkaline Phosphatase 116 38 - 126 U/L   Total Bilirubin 0.6 0.3 - 1.2 mg/dL   GFR calc non Af Amer >60 >60 mL/min   GFR calc Af Amer >60 >60 mL/min    Comment: (NOTE) The eGFR has been calculated using the CKD EPI equation. This calculation has not been validated in all clinical situations. eGFR's persistently <60 mL/min signify possible Chronic Kidney Disease.    Anion gap 13 5 - 15  CBC     Status: Abnormal   Collection Time: 09/12/2016  6:33 PM  Result Value Ref Range   WBC 15.1 (H) 4.0 - 10.5 K/uL   RBC 4.65 4.22 - 5.81 MIL/uL   Hemoglobin 14.8 13.0 - 17.0  g/dL   HCT 44.0 39.0 - 52.0 %   MCV 94.6 78.0 - 100.0 fL   MCH 31.8 26.0 - 34.0 pg   MCHC 33.6 30.0 - 36.0 g/dL   RDW 13.2 11.5 - 15.5 %   Platelets 251 150 - 400 K/uL  Protime-INR     Status: None   Collection Time: 08/24/2016  6:33 PM  Result Value Ref Range   Prothrombin Time 14.2 11.4 - 15.2 seconds   INR 1.09   I-Stat Chem 8, ED     Status: Abnormal   Collection Time: 09/19/2016  7:18 PM  Result Value Ref Range   Sodium 140 135 - 145 mmol/L   Potassium 3.2 (L) 3.5 - 5.1 mmol/L   Chloride 103 101 - 111 mmol/L   BUN 21 (H) 6 - 20 mg/dL   Creatinine, Ser 1.00 0.61 - 1.24 mg/dL   Glucose, Bld 122 (H) 65 - 99 mg/dL   Calcium, Ion 1.13 (L) 1.15 - 1.40 mmol/L   TCO2 25 0 - 100 mmol/L   Hemoglobin 15.0 13.0 - 17.0 g/dL   HCT 44.0 39.0 - 52.0 %  I-Stat CG4 Lactic Acid, ED     Status:  Abnormal   Collection Time: 09/13/2016  7:18 PM  Result Value Ref Range   Lactic Acid, Venous 4.69 (HH) 0.5 - 1.9 mmol/L   Comment NOTIFIED PHYSICIAN   Ethanol     Status: None   Collection Time: 09/17/2016  7:30 PM  Result Value Ref Range   Alcohol, Ethyl (B) <5 <5 mg/dL    Comment:        LOWEST DETECTABLE LIMIT FOR SERUM ALCOHOL IS 5 mg/dL FOR MEDICAL PURPOSES ONLY   Urinalysis, Routine w reflex microscopic     Status: Abnormal   Collection Time: 09/03/2016  9:13 PM  Result Value Ref Range   Color, Urine YELLOW YELLOW   APPearance CLEAR CLEAR   Specific Gravity, Urine 1.040 (H) 1.005 - 1.030   pH 5.0 5.0 - 8.0   Glucose, UA 50 (A) NEGATIVE mg/dL   Hgb urine dipstick SMALL (A) NEGATIVE   Bilirubin Urine NEGATIVE NEGATIVE   Ketones, ur 5 (A) NEGATIVE mg/dL   Protein, ur NEGATIVE NEGATIVE mg/dL   Nitrite NEGATIVE NEGATIVE   Leukocytes, UA NEGATIVE NEGATIVE   RBC / HPF 0-5 0 - 5 RBC/hpf   WBC, UA 0-5 0 - 5 WBC/hpf   Bacteria, UA NONE SEEN NONE SEEN   Squamous Epithelial / LPF NONE SEEN NONE SEEN   Mucous PRESENT   CBC     Status: Abnormal   Collection Time: 09/07/16  3:16 AM  Result Value Ref Range   WBC 11.9 (H) 4.0 - 10.5 K/uL   RBC 4.25 4.22 - 5.81 MIL/uL   Hemoglobin 13.4 13.0 - 17.0 g/dL   HCT 39.5 39.0 - 52.0 %   MCV 92.9 78.0 - 100.0 fL   MCH 31.5 26.0 - 34.0 pg   MCHC 33.9 30.0 - 36.0 g/dL   RDW 13.1 11.5 - 15.5 %   Platelets 165 150 - 400 K/uL  Basic metabolic panel     Status: Abnormal   Collection Time: 09/07/16  3:16 AM  Result Value Ref Range   Sodium 135 135 - 145 mmol/L   Potassium 4.1 3.5 - 5.1 mmol/L    Comment: NO VISIBLE HEMOLYSIS   Chloride 103 101 - 111 mmol/L   CO2 18 (L) 22 - 32 mmol/L   Glucose, Bld 191 (H) 65 -  99 mg/dL   BUN 13 6 - 20 mg/dL   Creatinine, Ser 1.03 0.61 - 1.24 mg/dL   Calcium 9.1 8.9 - 10.3 mg/dL   GFR calc non Af Amer >60 >60 mL/min   GFR calc Af Amer >60 >60 mL/min    Comment: (NOTE) The eGFR has been calculated using the  CKD EPI equation. This calculation has not been validated in all clinical situations. eGFR's persistently <60 mL/min signify possible Chronic Kidney Disease.    Anion gap 14 5 - 15  Magnesium     Status: Abnormal   Collection Time: 09/07/16  3:16 AM  Result Value Ref Range   Magnesium 1.5 (L) 1.7 - 2.4 mg/dL    Imaging: Ct Head Wo Contrast  Result Date: 09/17/2016 CLINICAL DATA:  83 year old male status post fall backwards while caring groceries up stairs. On anti coagulation for a fib. Lethargic and confused on arrival. Initial encounter. EXAM: CT HEAD WITHOUT CONTRAST CT CERVICAL SPINE WITHOUT CONTRAST TECHNIQUE: Multidetector CT imaging of the head and cervical spine was performed following the standard protocol without intravenous contrast. Multiplanar CT image reconstructions of the cervical spine were also generated. COMPARISON:  Cervical spine radiographs 07/08/2016. FINDINGS: CT HEAD FINDINGS Brain: Small to moderate volume subarachnoid hemorrhage throughout the left convexity. There is a 5 mm hyperdense extra-axial hemorrhage along the left lateral convexity which could be subdural or a small epidural hematoma. There is a contralateral right parafalcine and tentorial subdural hematoma measuring up to 10 mm in thickness, predominantly posterior. This right side subdural hematoma also wraps around the inferior and lateral right hemisphere measuring up to 5 mm in thickness laterally and superiorly. Superimposed trace right vertex subarachnoid hemorrhage. No basilar cistern subarachnoid hemorrhage. No intraventricular hemorrhage or ventriculomegaly. No midline shift at this time. Basilar cisterns remain patent. No posterior fossa hemorrhage. No cortically based acute infarct identified. Vascular: Calcified atherosclerosis at the skull base. Skull: Osteopenia. Non depressed left sphenoid wing and squamous ule anterior temporal bone skull fracture. This tracks cephalad and is mildly comminuted  toward the vertex. The fracture appears to terminated along the lateral aspect of the left middle cranial fossa. There is a nearby a nondisplaced left zygomatic arch fracture. No more central skullbase fracture is identified. However, there is also a nondisplaced right zygomatic arch fracture as well as a mildly displaced right lateral orbital wall fracture (series 202 images 13 in 22). There might be a trace amount of right intraorbital contusion (image 25). No other orbital wall fracture. Sinuses/Orbits: Small fluid levels in the left sphenoid sinus which appear possibly hyperdense such as related to hemorrhage. Similar fluid level in the posterior left ethmoid air cell. Small lower density fluid level in the right maxillary sinus with alveolar recess mucosal thickening associated. Mild opacification of the and appear ear left mastoid air cells. The left tympanic cavity and remaining mastoids are clear. The right tympanic cavity and right mastoids are clear. Other: Large broad-based left scalp hematoma measuring up to 12 mm in thickness. Other scalp soft tissues appear normal. Negative orbits soft tissues. Negative visualized deep soft tissue spaces of the face except for a small volume of gas in the left masticator space. CT CERVICAL SPINE FINDINGS Osteopenia. Occipital condyles -C1 alignment is preserved. 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 There is associated disruption of the right C2-C3 facet joint, with an avulsed fragment off of the posterior C3 facet (sagittal image 29). There is subsequent mild retrolisthesis of the right side body of C2 on C3. There is associated  epidural hemorrhage. There is associated C2 level spinal stenosis which is probably mild. The C3 body and pedicles are intact. There is a mildly displaced C3 spinous process fracture in addition to the small avulsion from the posterior right facet. The C4 vertebra is intact.  The C5 vertebra is intact. The C6 vertebra appears intact. Possible mild compression fracture of the C7 superior endplate. There are questionable nondisplaced fractures through the bilateral C7 superior articulating facet - but this is probably artifact due to osteopenia. There is mild to moderate compression of the T3 vertebral body. There is a minimally displaced fracture of the posterior left first rib. There is a comminuted fracture of the right mid clavicle. The other visualized upper thoracic levels appear grossly intact. Negative lung apices.  No cervical prevertebral fluid. IMPRESSION: CT HEAD: 1. Comminuted non-depressed left frontotemporal skull fracture. Bilateral zygomatic arch fractures. Right lateral orbital wall fracture. 2. Five mm left lateral epidural versus subdural hematoma. Right subdural hematoma with 10 mm parafalcine and 5 mm peripheral components. 3. Left greater than right subarachnoid hemorrhage. 4. No midline shift at this time. No intraventricular hemorrhage or ventriculomegaly. No hemorrhagic cerebral contusion identified at this time. 5. Small volume of hemorrhage in the sphenoid sinuses but no central skullbase fracture identified. CT CERVICAL SPINE: 1. Severe C2 fracture is impacted, comminuted, and displaced. Associated disruption of the right C2-C3 facet joint. Subsequent C2 level epidural hematoma and multifactorial spinal stenosis (probably mild at this time). 2. Small avulsion fractures of the posterior right C3 facet and the C3 spinous process. 3. Suspected mild compression fracture of the C7 superior endplate. Questionable nondisplaced bilateral C7 superior articulating facet fractures - but favor  artifact due to osteopenia instead. 4. Age indeterminate compression of the T3 vertebral body. 5. Mildly displaced posterior left first rib fracture. Critical Value/emergent results were called by telephone at the time of interpretation on 09/09/2016 at 1954 hours to Dr. Ellender Hose , who verbally acknowledged these results. Electronically Signed   By: Genevie Ann M.D.   On: 08/27/2016 20:09   Ct Chest W Contrast  Result Date: 08/30/2016 CLINICAL DATA:  Pain after fall EXAM: CT CHEST, ABDOMEN, AND PELVIS WITH CONTRAST TECHNIQUE: Multidetector CT imaging of the chest, abdomen and pelvis was performed following the standard protocol during bolus administration of intravenous contrast. CONTRAST:  164m ISOVUE-300 IOPAMIDOL (ISOVUE-300) INJECTION 61% COMPARISON:  04/13/2016 and 03/16/2008 CT abdomen and pelvis FINDINGS: CT CHEST FINDINGS Cardiovascular: Stable cardiomegaly without pericardial effusion. 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 What is indeterminate unfortunately is a whether there is an ascending aortic component due to motion. Vascular surgery consultation is recommended. Would have a low threshold for repeat imaging, with cardiac gating that may help solve this dilemma. Much of the dissection from the arch distal is thrombosed, with probable fenestration causing opacification on both sides of the dissection along the descending aorta. No mediastinal hematoma. No aneurysm. No large central pulmonary embolus. Coronary arteriosclerosis. Mediastinum/Nodes: No adenopathy. Normal appearance of  the thoracic esophagus. Trachea is patent. Lungs/Pleura: Small nodular densities measuring up to 7 mm are identified predominantly on the right and in the upper lobe. Study is somewhat limited to motion artifacts. Some the nodular densities may  represent branch points for pulmonary vessels especially in the lower right upper lobe adjacent to the minor fissure. Dependent atelectasis noted. Musculoskeletal: Multiple chronic thoracolumbar compression fractures, with slight loss of height at T3 since prior exam but stable along the remainder to at least the L1 level. Chronic fractures involve T3 and T6 through L1. Go CT ABDOMEN PELVIS FINDINGS Hepatobiliary: 3 cm indeterminate hypodense lesion of the right hepatic lobe with Hounsfield units not definitive for simple cyst but given lack of significant change from 2009 is likely benign finding possibly complex cyst. No biliary dilatation. No solid enhancing mass lesions seen. Pancreas: No pancreatic mass or ductal dilatation. Spleen: No splenomegaly or focal mass.  No splenic laceration. Adrenals/Urinary Tract: Unremarkable adrenal glands. No obstructive uropathy or mass. Physiologic distention of the bladder. Stomach/Bowel: No acute inflammation or bowel obstruction. Appendectomy by report. Vascular/Lymphatic: Aortic and branch vessel atherosclerosis. No lymphadenopathy. Reproductive: Enlarged prostate with peripheral zone calcifications of the prostate measures approximately 6.1 x 5.2 x 5.2 cm (volume = 86 cm^3) Other: Mild diastases of the rectus muscles. Musculoskeletal: Chronic L3 through L5 compression fractures. Stable L1 moderate compression fractures since prior chest CT dated 04/13/2016. Mild superior endplate compression of L2 since 2009 but incompletely imaged on recent CT. IMPRESSION: 1. 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 What is indeterminate unfortunately is a whether there is an ascending aortic component due to motion. Vascular surgery consultation is recommended. Would have a low threshold for repeat  imaging, with cardiac gating that may help solve this dilemma. Much of the dissection from the arch distal is thrombosed, with probable fenestration causing opacification on both sides of the dissection along the descending aorta. Critical Value/emergent results were called by telephone at the time of interpretation on 08/26/2016 at 8:51 pm to Dr. Ovid Curd PAGE , who verbally acknowledged these results. 2. Relatively stable nodular opacities within the lungs more so on the right since recent comparison study, the largest measuring up to 7 mm. Given relative stability would recommend follow-up in 18-24 months from the initial CT dated 04/13/2016 per Fleischner Society recommendations. 3. Multiple thoracolumbar compression fractures are again noted slightly more accentuated at T3 since prior exam. An L2 compression is also noted which is new since the 2009 CT. 4. Approximately 3 cm right hepatic hypodensity grossly stable since 2009 and likely benign finding not definitively a simple cyst by Hounsfield unit criteria. Electronically Signed   By: Ashley Royalty M.D.   On: 09/18/2016 20:57   Ct Cervical Spine Wo Contrast  Result Date: 08/23/2016 CLINICAL DATA:  83 year old male status post fall backwards while caring groceries up stairs. On anti coagulation for a fib. Lethargic and confused on arrival. Initial encounter. EXAM: CT HEAD WITHOUT CONTRAST CT CERVICAL SPINE WITHOUT CONTRAST TECHNIQUE: Multidetector CT imaging of the head and cervical spine was performed following the standard protocol without intravenous contrast. Multiplanar CT image reconstructions of the cervical spine were also generated. COMPARISON:  Cervical spine radiographs 07/08/2016. FINDINGS: CT HEAD FINDINGS Brain: Small to moderate volume subarachnoid hemorrhage throughout the left convexity. There is a 5 mm hyperdense extra-axial  hemorrhage along the left lateral convexity which could be subdural or a small epidural hematoma. There is a  contralateral right parafalcine and tentorial subdural hematoma measuring up to 10 mm in thickness, predominantly posterior. This right side subdural hematoma also wraps around the inferior and lateral right hemisphere measuring up to 5 mm in thickness laterally and superiorly. Superimposed trace right vertex subarachnoid hemorrhage. No basilar cistern subarachnoid hemorrhage. No intraventricular hemorrhage or ventriculomegaly. No midline shift at this time. Basilar cisterns remain patent. No posterior fossa hemorrhage. No cortically based acute infarct identified. Vascular: Calcified atherosclerosis at the skull base. Skull: Osteopenia. Non depressed left sphenoid wing and squamous ule anterior temporal bone skull fracture. This tracks cephalad and is mildly comminuted toward the vertex. The fracture appears to terminated along the lateral aspect of the left middle cranial fossa. There is a nearby a nondisplaced left zygomatic arch fracture. No more central skullbase fracture is identified. However, there is also a nondisplaced right zygomatic arch fracture as well as a mildly displaced right lateral orbital wall fracture (series 202 images 13 in 22). There might be a trace amount of right intraorbital contusion (image 25). No other orbital wall fracture. Sinuses/Orbits: Small fluid levels in the left sphenoid sinus which appear possibly hyperdense such as related to hemorrhage. Similar fluid level in the posterior left ethmoid air cell. Small lower density fluid level in the right maxillary sinus with alveolar recess mucosal thickening associated. Mild opacification of the and appear ear left mastoid air cells. The left tympanic cavity and remaining mastoids are clear. The right tympanic cavity and right mastoids are clear. Other: Large broad-based left scalp hematoma measuring up to 12 mm in thickness. Other scalp soft tissues appear normal. Negative orbits soft tissues. Negative visualized deep soft tissue  spaces of the face except for a small volume of gas in the left masticator space. CT CERVICAL SPINE FINDINGS Osteopenia. Occipital condyles -C1 alignment is preserved. 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 There is associated disruption of the right C2-C3 facet joint, with an avulsed fragment off of the posterior C3 facet (sagittal image 29). There is subsequent mild retrolisthesis of the right side body of C2 on C3. There is associated epidural hemorrhage. There is associated C2 level spinal stenosis which is probably mild. The C3 body and pedicles are intact. There is a mildly displaced C3 spinous process fracture in addition to the small avulsion from the posterior right facet. The C4 vertebra is intact.  The C5 vertebra is intact. The C6 vertebra appears intact. Possible mild compression fracture of the C7 superior endplate. There are questionable nondisplaced fractures through the bilateral C7 superior articulating facet - but this is probably artifact due to osteopenia. There is mild to moderate compression of the T3 vertebral body. There is a minimally displaced fracture of the posterior left first rib. There is a comminuted fracture of the right mid clavicle. The other visualized upper thoracic levels appear grossly intact. Negative lung apices.  No cervical prevertebral fluid. IMPRESSION: CT HEAD: 1. Comminuted non-depressed left frontotemporal skull fracture. Bilateral zygomatic arch fractures. Right lateral orbital wall fracture. 2. Five mm left lateral epidural versus subdural hematoma. Right subdural hematoma with 10  mm parafalcine and 5 mm peripheral components.  3. Left greater than right subarachnoid hemorrhage. 4. No midline shift at this time. No intraventricular hemorrhage or ventriculomegaly. No hemorrhagic cerebral contusion identified at this time. 5. Small volume of hemorrhage in the sphenoid sinuses but no central skullbase fracture identified. CT CERVICAL SPINE: 1. Severe C2 fracture is impacted, comminuted, and displaced. Associated disruption of the right C2-C3 facet joint. Subsequent C2 level epidural hematoma and multifactorial spinal stenosis (probably mild at this time). 2. Small avulsion fractures of the posterior right C3 facet and the C3 spinous process. 3. Suspected mild compression fracture of the C7 superior endplate. Questionable nondisplaced bilateral C7 superior articulating facet fractures - but favor artifact due to osteopenia instead. 4. Age indeterminate compression of the T3 vertebral body. 5. Mildly displaced posterior left first rib fracture. Critical Value/emergent results were called by telephone at the time of interpretation on 09/12/2016 at 1954 hours to Dr. Ellender Hose , who verbally acknowledged these results. Electronically Signed   By: Genevie Ann M.D.   On: 09/11/2016 20:09   Ct Abdomen Pelvis W Contrast  Result Date: 09/10/2016 CLINICAL DATA:  Pain after fall EXAM: CT CHEST, ABDOMEN, AND PELVIS WITH CONTRAST TECHNIQUE: Multidetector CT imaging of the chest, abdomen and pelvis was performed following the standard protocol during bolus administration of intravenous contrast. CONTRAST:  150m ISOVUE-300 IOPAMIDOL (ISOVUE-300) INJECTION 61% COMPARISON:  04/13/2016 and 03/16/2008 CT abdomen and pelvis FINDINGS: CT CHEST FINDINGS Cardiovascular: Stable cardiomegaly without pericardial effusion. 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 What is indeterminate  unfortunately is a whether there is an ascending aortic component due to motion. Vascular surgery consultation is recommended. Would have a low threshold for repeat imaging, with cardiac gating that may help solve this dilemma. Much of the dissection from the arch distal is thrombosed, with probable fenestration causing opacification on both sides of the dissection along the descending aorta. No mediastinal hematoma. No aneurysm. No large central pulmonary embolus. Coronary arteriosclerosis. Mediastinum/Nodes: No adenopathy. Normal appearance of the thoracic esophagus. Trachea is patent. Lungs/Pleura: Small nodular densities measuring up to 7 mm are identified predominantly on the right and in the upper lobe. Study is somewhat limited to motion artifacts. Some the nodular densities may represent branch points for pulmonary vessels especially in the lower right upper lobe adjacent to the minor fissure. Dependent atelectasis noted. Musculoskeletal: Multiple chronic thoracolumbar compression fractures, with slight loss of height at T3 since prior exam but stable along the remainder to at least the L1 level. Chronic fractures involve T3 and T6 through L1. Go CT ABDOMEN PELVIS FINDINGS Hepatobiliary: 3 cm indeterminate hypodense lesion of the right hepatic lobe with Hounsfield units not definitive for simple cyst but given lack of significant change from 2009 is likely benign finding possibly complex cyst. No biliary dilatation. No solid enhancing mass lesions seen. Pancreas: No pancreatic mass or ductal dilatation. Spleen: No splenomegaly or focal mass.  No splenic laceration. Adrenals/Urinary Tract: Unremarkable adrenal glands. No obstructive uropathy or mass. Physiologic distention of the bladder. Stomach/Bowel: No acute inflammation or bowel obstruction. Appendectomy by report. Vascular/Lymphatic: Aortic and branch vessel atherosclerosis. No lymphadenopathy. Reproductive: Enlarged prostate with peripheral zone  calcifications of the prostate measures approximately 6.1 x 5.2 x 5.2 cm (volume = 86 cm^3) Other: Mild diastases of the rectus muscles. Musculoskeletal: Chronic L3  through L5 compression fractures. Stable L1 moderate compression fractures since prior chest CT dated 04/13/2016. Mild superior endplate compression of L2 since 2009 but incompletely imaged on recent CT. IMPRESSION: 1. 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 What is indeterminate unfortunately is a whether there is an ascending aortic component due to motion. Vascular surgery consultation is recommended. Would have a low threshold for repeat imaging, with cardiac gating that may help solve this dilemma. Much of the dissection from the arch distal is thrombosed, with probable fenestration causing opacification on both sides of the dissection along the descending aorta. Critical Value/emergent results were called by telephone at the time of interpretation on 09/14/2016 at 8:51 pm to Dr. Ovid Curd PAGE , who verbally acknowledged these results. 2. Relatively stable nodular opacities within the lungs more so on the right since recent comparison study, the largest measuring up to 7 mm. Given relative stability would recommend follow-up in 18-24 months from the initial CT dated 04/13/2016 per Fleischner Society recommendations. 3. Multiple thoracolumbar compression fractures are again noted slightly more accentuated at T3 since prior exam. An L2 compression is also noted which is new since the 2009 CT. 4. Approximately 3 cm right hepatic hypodensity grossly stable since 2009 and likely benign finding not definitively a simple cyst by Hounsfield unit criteria. Electronically Signed   By: Ashley Royalty M.D.   On: 08/31/2016 20:57    Assessment:  Active Problems:   Hypertension    PAF (paroxysmal atrial fibrillation) (HCC)   Subdural hematoma, post-traumatic (HCC)   Aortic dissection (HCC)   Plan:  1. Mr. Davies has recurrent a-fib which has been maintained in sinus on tikosyn, but is NPO and this has been discontinued. Anticoagulation has been held as well. BP in the 160/90-100 range - he has a Type B aortic dissection, no plans for surgery. Medical therapy is recommended. High risk for recurrent a-fib. Would recommend starting IV labetalol gtts - goal BP <140/90 or closer to 120/80 with HR <70.   Cardiology will follow with you.  Time Spent Directly with Patient:  15 minutes  Length of Stay:  LOS: 1 day   Pixie Casino, MD, [facility] Attending Cardiologist [place] 09/07/2016, 10:25 AM

## 2016-09-08 ENCOUNTER — Inpatient Hospital Stay (HOSPITAL_COMMUNITY): Payer: Medicare Other

## 2016-09-08 DIAGNOSIS — I4892 Unspecified atrial flutter: Secondary | ICD-10-CM

## 2016-09-08 DIAGNOSIS — I6201 Nontraumatic acute subdural hemorrhage: Secondary | ICD-10-CM

## 2016-09-08 DIAGNOSIS — S065X9A Traumatic subdural hemorrhage with loss of consciousness of unspecified duration, initial encounter: Secondary | ICD-10-CM

## 2016-09-08 DIAGNOSIS — Z7189 Other specified counseling: Secondary | ICD-10-CM

## 2016-09-08 DIAGNOSIS — I609 Nontraumatic subarachnoid hemorrhage, unspecified: Secondary | ICD-10-CM

## 2016-09-08 DIAGNOSIS — S065XAA Traumatic subdural hemorrhage with loss of consciousness status unknown, initial encounter: Secondary | ICD-10-CM

## 2016-09-08 DIAGNOSIS — Z515 Encounter for palliative care: Secondary | ICD-10-CM

## 2016-09-08 MED ORDER — AMIODARONE HCL IN DEXTROSE 360-4.14 MG/200ML-% IV SOLN
INTRAVENOUS | Status: AC
  Administered 2016-09-08: 150 mg via INTRAVENOUS
  Filled 2016-09-08: qty 200

## 2016-09-08 MED ORDER — AMIODARONE HCL IN DEXTROSE 360-4.14 MG/200ML-% IV SOLN: 30.0000 mg/h | INTRAVENOUS | Status: DC

## 2016-09-08 MED ORDER — AMIODARONE LOAD VIA INFUSION
150.0000 mg | Freq: Once | INTRAVENOUS | Status: AC
  Administered 2016-09-08: 150 mg via INTRAVENOUS
  Filled 2016-09-08: qty 83.34

## 2016-09-08 MED ORDER — AMIODARONE HCL IN DEXTROSE 360-4.14 MG/200ML-% IV SOLN
60.0000 mg/h | INTRAVENOUS | Status: DC
  Administered 2016-09-08: 60 mg/h via INTRAVENOUS

## 2016-09-08 MED ORDER — GLYCOPYRROLATE 0.2 MG/ML IJ SOLN: 0.4000 mg | Freq: Three times a day (TID) | INTRAMUSCULAR | Status: DC

## 2016-09-08 MED ORDER — METOPROLOL TARTRATE 5 MG/5ML IV SOLN
2.5000 mg | Freq: Once | INTRAVENOUS | Status: AC
  Administered 2016-09-08: 2.5 mg via INTRAVENOUS
  Filled 2016-09-08: qty 5

## 2016-09-08 MED ORDER — SODIUM CHLORIDE 0.9 % IV BOLUS (SEPSIS)
200.0000 mL | Freq: Once | INTRAVENOUS | Status: AC
  Administered 2016-09-08: 200 mL via INTRAVENOUS

## 2016-09-08 MED ORDER — GLYCOPYRROLATE 0.2 MG/ML IJ SOLN: 0.2000 mg | INTRAMUSCULAR | Status: DC | PRN

## 2016-09-08 MED ORDER — ORAL CARE MOUTH RINSE
15.0000 mL | Freq: Two times a day (BID) | OROMUCOSAL | Status: DC
  Administered 2016-09-08: 15 mL via OROMUCOSAL

## 2016-09-08 MED ORDER — GLYCOPYRROLATE 1 MG PO TABS: 1.0000 mg | ORAL_TABLET | ORAL | Status: DC | PRN

## 2016-09-08 MED ORDER — POLYVINYL ALCOHOL 1.4 % OP SOLN
1.0000 [drp] | Freq: Four times a day (QID) | OPHTHALMIC | Status: DC | PRN
  Filled 2016-09-08: qty 15

## 2016-09-08 MED ORDER — MORPHINE BOLUS VIA INFUSION
4.0000 mg | INTRAVENOUS | Status: DC | PRN
  Filled 2016-09-08: qty 4

## 2016-09-08 MED ORDER — ACETAMINOPHEN 325 MG PO TABS: 650.0000 mg | ORAL_TABLET | Freq: Four times a day (QID) | ORAL | Status: DC | PRN

## 2016-09-08 MED ORDER — HALOPERIDOL LACTATE 2 MG/ML PO CONC
0.5000 mg | ORAL | Status: DC | PRN
  Filled 2016-09-08: qty 0.3

## 2016-09-08 MED ORDER — HALOPERIDOL LACTATE 5 MG/ML IJ SOLN: 0.5000 mg | INTRAMUSCULAR | Status: DC | PRN

## 2016-09-08 MED ORDER — DIGOXIN 0.25 MG/ML IJ SOLN
0.2500 mg | Freq: Four times a day (QID) | INTRAMUSCULAR | Status: AC
  Administered 2016-09-08 (×2): 0.25 mg via INTRAVENOUS
  Filled 2016-09-08 (×2): qty 2

## 2016-09-08 MED ORDER — SODIUM CHLORIDE 0.9 % IV SOLN
4.0000 mg/h | INTRAVENOUS | Status: DC
  Administered 2016-09-08: 2 mg/h via INTRAVENOUS
  Filled 2016-09-08: qty 10
  Filled 2016-09-08: qty 5

## 2016-09-08 MED ORDER — MIDAZOLAM BOLUS VIA INFUSION
1.0000 mg | INTRAVENOUS | Status: DC | PRN
  Filled 2016-09-08: qty 2

## 2016-09-08 MED ORDER — HYDROMORPHONE HCL 1 MG/ML IJ SOLN: 0.5000 mg | INTRAMUSCULAR | Status: DC | PRN

## 2016-09-08 MED ORDER — MORPHINE BOLUS VIA INFUSION
2.0000 mg | INTRAVENOUS | Status: DC | PRN
  Administered 2016-09-08: 3 mg via INTRAVENOUS
  Administered 2016-09-08: 2 mg via INTRAVENOUS
  Filled 2016-09-08: qty 5

## 2016-09-08 MED ORDER — ACETAMINOPHEN 650 MG RE SUPP: 650.0000 mg | Freq: Four times a day (QID) | RECTAL | Status: DC | PRN

## 2016-09-08 MED ORDER — FUROSEMIDE 10 MG/ML IJ SOLN
60.0000 mg | Freq: Once | INTRAMUSCULAR | Status: DC
Start: 2016-09-08 — End: 2016-09-08

## 2016-09-08 MED ORDER — HALOPERIDOL 0.5 MG PO TABS
0.5000 mg | ORAL_TABLET | ORAL | Status: DC | PRN
  Filled 2016-09-08: qty 1

## 2016-09-08 MED ORDER — SODIUM CHLORIDE 0.9% FLUSH: 3.0000 mL | INTRAVENOUS | Status: DC | PRN

## 2016-09-08 MED ORDER — SODIUM CHLORIDE 0.9 % IV SOLN
2.0000 mg/h | INTRAVENOUS | Status: DC
  Administered 2016-09-08: 1 mg/h via INTRAVENOUS
  Filled 2016-09-08: qty 10

## 2016-09-08 MED ORDER — SODIUM CHLORIDE 0.9 % IV SOLN: 250.0000 mL | INTRAVENOUS | Status: DC | PRN

## 2016-09-08 MED ORDER — MORPHINE BOLUS VIA INFUSION
5.0000 mg | INTRAVENOUS | Status: DC | PRN
  Filled 2016-09-08: qty 5

## 2016-09-08 MED ORDER — CHLORHEXIDINE GLUCONATE 0.12 % MT SOLN
15.0000 mL | Freq: Two times a day (BID) | OROMUCOSAL | Status: DC
  Administered 2016-09-08: 15 mL via OROMUCOSAL

## 2016-09-08 MED ORDER — SODIUM CHLORIDE 0.9% FLUSH
3.0000 mL | Freq: Two times a day (BID) | INTRAVENOUS | Status: DC
  Administered 2016-09-08: 3 mL via INTRAVENOUS

## 2016-09-08 MED ORDER — BIOTENE DRY MOUTH MT LIQD: 15.0000 mL | OROMUCOSAL | Status: DC | PRN

## 2016-09-09 ENCOUNTER — Encounter (HOSPITAL_COMMUNITY): Payer: Self-pay

## 2016-09-09 ENCOUNTER — Telehealth: Payer: Self-pay

## 2016-09-09 NOTE — Telephone Encounter (Signed)
Called daughter [name] and expressed our condolences re Mr.Kreiger death. She was most appreciative.

## 2016-09-09 NOTE — Telephone Encounter (Signed)
Phone note in chart.

## 2016-09-09 NOTE — Telephone Encounter (Signed)
Spoke with daughter [name] this a.m. Patient expired at [place] yesterday. He suffered a fall on Tuesday 01/16. Admitted and diagnosed with a thoracic aortic dissection, along with skull and C spine fractures.

## 2016-09-22 NOTE — Progress Notes (Signed)
Cards paged due to SVT sustaining in 160s-170s. Orders received to give Digoxin dose early and follow with IV Lopressor if HR unaffected. Will continue to monitor patient.

## 2016-09-22 NOTE — Consult Note (Signed)
FACIAL TRAUMA CONSULT:  Reason for Consult: Facial fractures Referring Physician: Trauma service  Antonio [name] is an 83 y.o. male.  HPI: Patient admitted to [facility] on 09/15/2016 for evaluation after falling on stairs with multiple significant injuries including cervical spinal fracture and intracranial hemorrhage. Patient admitted to trauma service for management of his acute injuries. The patient was noted to have fractures of the zygomatic arch bilaterally on CT scan. Moderate periorbital ecchymosis and swelling and bilateral auricular hematomas. The patient is on anticoagulant therapy.  Past Medical History:  Diagnosis Date  . Anemia   . Aortic dissection ([place]) 08/25/2016  . Appendicitis    SBP 7/09 ruputre w/ RLQ abscess 03/16/08  . Atrial fibrillation ([place])   . Basal cell carcinoma of nose 2016   Right side nasal tip, [name]  . BPH (benign prostatic hyperplasia) 06/29/2011  . Cardiomyopathy    secondary to Tachycardia. A-Acute on chronic admit for CHF 6/08. B- CHF coupled with atrial fib RVR electrophysiology study 09/27/07.  A- Multiple flutters and Atrial fib- no ablation performed b- ibutilide cardoversion c- amiordarone load- d/c in sinus rhyrthm d- coumadin anticoagulation- stopped at time of GI bleed 11/09  . Glaucoma   . History of blood transfusion 2009   "related to rupture in esophagus" (04/13/2016)  . Hyperlipidemia   . Hypertension   . Knee fracture, right 08/2014   "no OR" (04/13/2016)  . Lung nodules    [name] 04/13/2016  . Mallory - Weiss tear    esophagitis, 6 unit hemorrhage- EPI injections   . Small bowel obstruction     Past Surgical History:  Procedure Laterality Date  . APPENDECTOMY  02/2008   exploratory, ruptured appendix, RLQ abscess  . BASAL CELL CARCINOMA EXCISION     nose  . CATARACT EXTRACTION W/ INTRAOCULAR LENS  IMPLANT, BILATERAL Bilateral   . COLONOSCOPY    . COLONOSCOPY W/ BIOPSIES AND POLYPECTOMY  ~ 2015  . EP study  09/27/07   see pmh   . EXPLORATORY LAPAROTOMY  02/2008  . HEMORRHOID SURGERY    . TEE WITHOUT CARDIOVERSION N/A 02/08/2016   Procedure: TRANSESOPHAGEAL ECHOCARDIOGRAM (TEE);  Surgeon: Fay Records, MD;  Location: [facility] ENDOSCOPY;  Service: Cardiovascular;  Laterality: N/A;  . TEE WITHOUT CARDIOVERSION N/A 04/11/2016   Procedure: TRANSESOPHAGEAL ECHOCARDIOGRAM (TEE);  Surgeon: Josue Hector, MD;  Location: [facility] ENDOSCOPY;  Service: Cardiovascular;  Laterality: N/A;  . TEE/DCCV  02/07/07    Family History  Problem Relation Age of Onset  . Uterine cancer Mother   . Heart attack Maternal Uncle   . Heart disease Maternal Uncle   . Colon cancer Neg Hx   . Prostate cancer Neg Hx   . Diabetes Neg Hx     Social History:  reports that he quit smoking about 45 years ago. He has a 23.00 pack-year smoking history. He has never used smokeless tobacco. He reports that he does not drink alcohol or use drugs.  Allergies:  Allergies  Allergen Reactions  . Amiodarone Other (See Comments)    Thyroid toxicity  . Fentanyl Rash    Possible source of a reaction he has experienced (given Benadryl to counteract)  . Kcentra [Prothrombin Complex Conc Human] Rash    Possible source of a reaction he has experienced (given Benadryl to counteract)    Medications: I have reviewed the patient's current medications.  Results for orders placed or performed during the hospital encounter of 09/20/2016 (from the past 48 hour(s))  Comprehensive  metabolic panel     Status: Abnormal   Collection Time: 09/12/2016  6:33 PM  Result Value Ref Range   Sodium 138 135 - 145 mmol/L   Potassium 3.4 (L) 3.5 - 5.1 mmol/L   Chloride 104 101 - 111 mmol/L   CO2 21 (L) 22 - 32 mmol/L   Glucose, Bld 120 (H) 65 - 99 mg/dL   BUN 17 6 - 20 mg/dL   Creatinine, Ser 0.99 0.61 - 1.24 mg/dL   Calcium 9.6 8.9 - 10.3 mg/dL   Total Protein 7.2 6.5 - 8.1 g/dL   Albumin 3.9 3.5 - 5.0 g/dL   AST 48 (H) 15 - 41 U/L   ALT 43 17 - 63 U/L   Alkaline Phosphatase 116 38 -  126 U/L   Total Bilirubin 0.6 0.3 - 1.2 mg/dL   GFR calc non Af Amer >60 >60 mL/min   GFR calc Af Amer >60 >60 mL/min    Comment: (NOTE) The eGFR has been calculated using the CKD EPI equation. This calculation has not been validated in all clinical situations. eGFR's persistently <60 mL/min signify possible Chronic Kidney Disease.    Anion gap 13 5 - 15  CBC     Status: Abnormal   Collection Time:   6:33 PM  Result Value Ref Range   WBC 15.1 (H) 4.0 - 10.5 K/uL   RBC 4.65 4.22 - 5.81 MIL/uL   Hemoglobin 14.8 13.0 - 17.0 g/dL   HCT 44.0 39.0 - 52.0 %   MCV 94.6 78.0 - 100.0 fL   MCH 31.8 26.0 - 34.0 pg   MCHC 33.6 30.0 - 36.0 g/dL   RDW 13.2 11.5 - 15.5 %   Platelets 251 150 - 400 K/uL  Protime-INR     Status: None   Collection Time: 08/25/2016  6:33 PM  Result Value Ref Range   Prothrombin Time 14.2 11.4 - 15.2 seconds   INR 1.09   I-Stat Chem 8, ED     Status: Abnormal   Collection Time: 08/25/2016  7:18 PM  Result Value Ref Range   Sodium 140 135 - 145 mmol/L   Potassium 3.2 (L) 3.5 - 5.1 mmol/L   Chloride 103 101 - 111 mmol/L   BUN 21 (H) 6 - 20 mg/dL   Creatinine, Ser 1.00 0.61 - 1.24 mg/dL   Glucose, Bld 122 (H) 65 - 99 mg/dL   Calcium, Ion 1.13 (L) 1.15 - 1.40 mmol/L   TCO2 25 0 - 100 mmol/L   Hemoglobin 15.0 13.0 - 17.0 g/dL   HCT 44.0 39.0 - 52.0 %  I-Stat CG4 Lactic Acid, ED     Status: Abnormal   Collection Time: 09/16/2016  7:18 PM  Result Value Ref Range   Lactic Acid, Venous 4.69 (HH) 0.5 - 1.9 mmol/L   Comment NOTIFIED PHYSICIAN   Ethanol     Status: None   Collection Time: 09/14/2016  7:30 PM  Result Value Ref Range   Alcohol, Ethyl (B) <5 <5 mg/dL    Comment:        LOWEST DETECTABLE LIMIT FOR SERUM ALCOHOL IS 5 mg/dL FOR MEDICAL PURPOSES ONLY   Urinalysis, Routine w reflex microscopic     Status: Abnormal   Collection Time: 09/10/2016  9:13 PM  Result Value Ref Range   Color, Urine YELLOW YELLOW   APPearance CLEAR CLEAR   Specific Gravity,  Urine 1.040 (H) 1.005 - 1.030   pH 5.0 5.0 - 8.0   Glucose, UA 50 (  A) NEGATIVE mg/dL   Hgb urine dipstick SMALL (A) NEGATIVE   Bilirubin Urine NEGATIVE NEGATIVE   Ketones, ur 5 (A) NEGATIVE mg/dL   Protein, ur NEGATIVE NEGATIVE mg/dL   Nitrite NEGATIVE NEGATIVE   Leukocytes, UA NEGATIVE NEGATIVE   RBC / HPF 0-5 0 - 5 RBC/hpf   WBC, UA 0-5 0 - 5 WBC/hpf   Bacteria, UA NONE SEEN NONE SEEN   Squamous Epithelial / LPF NONE SEEN NONE SEEN   Mucous PRESENT   CBC     Status: Abnormal   Collection Time: 09/07/16  3:16 AM  Result Value Ref Range   WBC 11.9 (H) 4.0 - 10.5 K/uL   RBC 4.25 4.22 - 5.81 MIL/uL   Hemoglobin 13.4 13.0 - 17.0 g/dL   HCT 39.5 39.0 - 52.0 %   MCV 92.9 78.0 - 100.0 fL   MCH 31.5 26.0 - 34.0 pg   MCHC 33.9 30.0 - 36.0 g/dL   RDW 13.1 11.5 - 15.5 %   Platelets 165 150 - 400 K/uL  Basic metabolic panel     Status: Abnormal   Collection Time: 09/07/16  3:16 AM  Result Value Ref Range   Sodium 135 135 - 145 mmol/L   Potassium 4.1 3.5 - 5.1 mmol/L    Comment: NO VISIBLE HEMOLYSIS   Chloride 103 101 - 111 mmol/L   CO2 18 (L) 22 - 32 mmol/L   Glucose, Bld 191 (H) 65 - 99 mg/dL   BUN 13 6 - 20 mg/dL   Creatinine, Ser 1.03 0.61 - 1.24 mg/dL   Calcium 9.1 8.9 - 10.3 mg/dL   GFR calc non Af Amer >60 >60 mL/min   GFR calc Af Amer >60 >60 mL/min    Comment: (NOTE) The eGFR has been calculated using the CKD EPI equation. This calculation has not been validated in all clinical situations. eGFR's persistently <60 mL/min signify possible Chronic Kidney Disease.    Anion gap 14 5 - 15  Magnesium     Status: Abnormal   Collection Time: 09/07/16  3:16 AM  Result Value Ref Range   Magnesium 1.5 (L) 1.7 - 2.4 mg/dL  Lactic acid, plasma     Status: Abnormal   Collection Time: 09/07/16 11:21 AM  Result Value Ref Range   Lactic Acid, Venous 4.6 (HH) 0.5 - 1.9 mmol/L    Comment: CRITICAL RESULT CALLED TO, READ BACK BY AND VERIFIED WITH: M.GOLDING,RN 1221 09/07/16 CLARK,S      Ct Head Wo Contrast  Result Date: 18-Sep-2016 CLINICAL DATA:  83 y/o M; status post fall with intracranial hemorrhage. EXAM: CT HEAD WITHOUT CONTRAST TECHNIQUE: Contiguous axial images were obtained from the base of the skull through the vertex without intravenous contrast. COMPARISON:  None. FINDINGS: Brain: Interval increase in size of the subdural hematoma overlying the right convexity and extending along the falx and right-sided tentorium cerebelli measuring up to 16 mm in thickness along the dorsal falx, previously 10 mm, and at the 10 mm over the convexity. There has been interval redistribution of subarachnoid hemorrhage not present over the convexities bilaterally and overlying the cerebellar hemispheres. The subdural hemorrhage that was previously lying over the left sylvian fissure now layers dependently over the left parietal lobe and is stable in size. There is no evidence for brain parenchymal hemorrhage or large territory infarct. There is mild effacement of the right lateral ventricle and slight right to left midline shift of 3 mm (series 2, image 19). Vascular: Extensive calcification of the  cavernous internal carotid arteries. Skull: Stable nondisplaced fracture extending from sphenoid wing through the temporal squama to the vertex. Nondisplaced fracture of the right zygoma mildly buckled fracture of the right lateral wall of the orbit. Nondisplaced fracture of the left zygomatic arch and slight buckling of the left lateral wall of the maxillary sinus. Increase in size of subgaleal scalp hematoma extending over the convexities bilaterally in greatest concentration in the left temporal parietal region. Sinuses/Orbits: Paranasal sinus mucosal thickening with fluid levels and ethmoid, sphenoid, and right maxillary sinuses. Partial effusion of the left mastoid air cells. No acute intraorbital process identified. Other: None. IMPRESSION: 1. Interval moderate increase in subdural hematoma overlying  the right convexity and extending along the falx and right-sided tentorium cerebelli. 2. Mild increase in mass effect with partial effacement of the right lateral ventricle and 3 mm of right-to-left midline shift. 3. Interval redistribution of subarachnoid hemorrhage with increased blood over the right convexity and over the cerebellar hemispheres. 4. Stable small left subdural hematoma redistributed over the left parietal lobe. 5. Stable skull and facial fractures as described. 6. Increasing fluid levels in the paranasal sinuses may represent hemorrhage. 7. Increase in size of subgaleal scalp hematoma extending over the convexities bilaterally in greatest concentration in the left temporal parietal region. These results will be called to the ordering clinician or representative by the Radiologist Assistant, and communication documented in the PACS or zVision Dashboard. Electronically Signed   By: Kristine Garbe M.D.   On: 2016/10/02 02:28   Ct Head Wo Contrast  Result Date: 09/01/2016 CLINICAL DATA:  83 year old male status post fall backwards while caring groceries up stairs. On anti coagulation for a fib. Lethargic and confused on arrival. Initial encounter. EXAM: CT HEAD WITHOUT CONTRAST CT CERVICAL SPINE WITHOUT CONTRAST TECHNIQUE: Multidetector CT imaging of the head and cervical spine was performed following the standard protocol without intravenous contrast. Multiplanar CT image reconstructions of the cervical spine were also generated. COMPARISON:  Cervical spine radiographs 07/08/2016. FINDINGS: CT HEAD FINDINGS Brain: Small to moderate volume subarachnoid hemorrhage throughout the left convexity. There is a 5 mm hyperdense extra-axial hemorrhage along the left lateral convexity which could be subdural or a small epidural hematoma. There is a contralateral right parafalcine and tentorial subdural hematoma measuring up to 10 mm in thickness, predominantly posterior. This right side subdural  hematoma also wraps around the inferior and lateral right hemisphere measuring up to 5 mm in thickness laterally and superiorly. Superimposed trace right vertex subarachnoid hemorrhage. No basilar cistern subarachnoid hemorrhage. No intraventricular hemorrhage or ventriculomegaly. No midline shift at this time. Basilar cisterns remain patent. No posterior fossa hemorrhage. No cortically based acute infarct identified. Vascular: Calcified atherosclerosis at the skull base. Skull: Osteopenia. Non depressed left sphenoid wing and squamous ule anterior temporal bone skull fracture. This tracks cephalad and is mildly comminuted toward the vertex. The fracture appears to terminated along the lateral aspect of the left middle cranial fossa. There is a nearby a nondisplaced left zygomatic arch fracture. No more central skullbase fracture is identified. However, there is also a nondisplaced right zygomatic arch fracture as well as a mildly displaced right lateral orbital wall fracture (series 202 images 13 in 22). There might be a trace amount of right intraorbital contusion (image 25). No other orbital wall fracture. Sinuses/Orbits: Small fluid levels in the left sphenoid sinus which appear possibly hyperdense such as related to hemorrhage. Similar fluid level in the posterior left ethmoid air cell. Small lower density fluid level in  the right maxillary sinus with alveolar recess mucosal thickening associated. Mild opacification of the and appear ear left mastoid air cells. The left tympanic cavity and remaining mastoids are clear. The right tympanic cavity and right mastoids are clear. Other: Large broad-based left scalp hematoma measuring up to 12 mm in thickness. Other scalp soft tissues appear normal. Negative orbits soft tissues. Negative visualized deep soft tissue spaces of the face except for a small volume of gas in the left masticator space. CT CERVICAL SPINE FINDINGS Osteopenia. Occipital condyles -C1 alignment  is preserved. 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 There is associated disruption of the right C2-C3 facet joint, with an avulsed fragment off of the posterior C3 facet (sagittal image 29). There is subsequent mild retrolisthesis of the right side body of C2 on C3. There is associated epidural hemorrhage. There is associated C2 level spinal stenosis which is probably mild. The C3 body and pedicles are intact. There is a mildly displaced C3 spinous process fracture in addition to the small avulsion from the posterior right facet. The C4 vertebra is intact.  The C5 vertebra is intact. The C6 vertebra appears intact. Possible mild compression fracture of the C7 superior endplate. There are questionable nondisplaced fractures through the bilateral C7 superior articulating facet - but this is probably artifact due to osteopenia. There is mild to moderate compression of the T3 vertebral body. There is a minimally displaced fracture of the posterior left first rib. There is a comminuted fracture of the right mid clavicle. The other visualized upper thoracic levels appear grossly intact. Negative lung apices.  No cervical prevertebral fluid. IMPRESSION: CT HEAD: 1. Comminuted non-depressed left frontotemporal skull fracture. Bilateral zygomatic arch fractures. Right lateral orbital wall fracture. 2. Five mm left lateral epidural versus subdural hematoma. Right subdural hematoma with 10 mm parafalcine and 5 mm peripheral components. 3. Left greater than right subarachnoid hemorrhage. 4. No midline shift at this time. No intraventricular hemorrhage or ventriculomegaly. No hemorrhagic  cerebral contusion identified at this time. 5. Small volume of hemorrhage in the sphenoid sinuses but no central skullbase fracture identified. CT CERVICAL SPINE: 1. Severe C2 fracture is impacted, comminuted, and displaced. Associated disruption of the right C2-C3 facet joint. Subsequent C2 level epidural hematoma and multifactorial spinal stenosis (probably mild at this time). 2. Small avulsion fractures of the posterior right C3 facet and the C3 spinous process. 3. Suspected mild compression fracture of the C7 superior endplate. Questionable nondisplaced bilateral C7 superior articulating facet fractures - but favor artifact due to osteopenia instead. 4. Age indeterminate compression of the T3 vertebral body. 5. Mildly displaced posterior left first rib fracture. Critical Value/emergent results were called by telephone at the time of interpretation on 09/03/2016 at 1954 hours to Dr. Ellender Hose , who verbally acknowledged these results. Electronically Signed   By: Genevie Ann M.D.   On: 09/13/2016 20:09   Ct Chest W Contrast  Result Date: 08/30/2016 CLINICAL DATA:  Pain after fall EXAM: CT CHEST, ABDOMEN, AND PELVIS WITH CONTRAST TECHNIQUE: Multidetector CT imaging of the chest, abdomen and pelvis was performed following the standard protocol during bolus administration of intravenous contrast. CONTRAST:  120m ISOVUE-300 IOPAMIDOL (ISOVUE-300) INJECTION 61% COMPARISON:  04/13/2016 and 03/16/2008 CT abdomen and pelvis FINDINGS: CT CHEST FINDINGS Cardiovascular: Stable cardiomegaly without pericardial effusion. 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 What is indeterminate unfortunately is a whether there is an ascending aortic component due to motion. Vascular surgery consultation is recommended. Would have a low threshold for repeat  imaging, with cardiac gating that may help solve this dilemma. Much of the dissection from the arch distal is thrombosed, with probable fenestration causing opacification on both sides of the dissection along the descending aorta. No mediastinal hematoma. No aneurysm. No large central pulmonary embolus. Coronary arteriosclerosis. Mediastinum/Nodes: No adenopathy. Normal appearance of the thoracic esophagus. Trachea is patent. Lungs/Pleura: Small nodular densities measuring up to 7 mm are identified predominantly on the right and in the upper lobe. Study is somewhat limited to motion artifacts. Some the nodular densities may represent branch points for pulmonary vessels especially in the lower right upper lobe adjacent to the minor fissure. Dependent atelectasis noted. Musculoskeletal: Multiple chronic thoracolumbar compression fractures, with slight loss of height at T3 since prior exam but stable along the remainder to at least the L1 level. Chronic fractures involve T3 and T6 through L1. Go CT ABDOMEN PELVIS FINDINGS Hepatobiliary: 3 cm indeterminate hypodense lesion of the right hepatic lobe with Hounsfield units not definitive for simple cyst but given lack of significant change from 2009 is likely benign finding possibly complex cyst. No biliary dilatation. No solid enhancing mass lesions seen. Pancreas: No pancreatic mass or ductal dilatation. Spleen: No splenomegaly or focal mass.  No splenic laceration. Adrenals/Urinary Tract: Unremarkable adrenal glands. No obstructive uropathy or mass. Physiologic distention of the bladder. Stomach/Bowel: No acute inflammation or bowel obstruction. Appendectomy by report. Vascular/Lymphatic: Aortic and branch vessel atherosclerosis. No lymphadenopathy. Reproductive: Enlarged prostate with peripheral zone calcifications of the prostate measures approximately 6.1 x 5.2 x 5.2 cm (volume = 86 cm^3) Other: Mild diastases of the rectus muscles. Musculoskeletal: Chronic L3  through L5 compression fractures. Stable L1 moderate compression fractures since prior chest CT dated 04/13/2016. Mild superior endplate compression of L2 since 2009 but incompletely imaged on recent CT. IMPRESSION: 1. 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 What is indeterminate unfortunately is a whether there is an ascending aortic component due to motion. Vascular surgery consultation is recommended. Would have a low threshold for repeat imaging, with cardiac gating that may help solve this dilemma. Much of the dissection from the arch distal is thrombosed, with probable fenestration causing opacification on both sides of the dissection along the descending aorta. Critical Value/emergent results were called by telephone at the time of interpretation on 09/15/2016 at 8:51 pm to Dr. Harrold Donath PAGE , who verbally acknowledged these results. 2. Relatively stable nodular opacities within the lungs more so on the right since recent comparison study, the largest measuring up to 7 mm. Given relative stability would recommend follow-up in 18-24 months from the initial CT dated 04/13/2016 per Fleischner Society recommendations. 3. Multiple thoracolumbar compression fractures are again noted slightly more accentuated at T3 since prior exam. An L2 compression is also noted which is new since the 2009 CT. 4. Approximately 3 cm right hepatic hypodensity grossly stable  since 2009 and likely benign finding not definitively a simple cyst by Hounsfield unit criteria. Electronically Signed   By: Ashley Royalty M.D.   On: 09/02/2016 20:57   Ct Cervical Spine Wo Contrast  Result Date: 09/02/2016 CLINICAL DATA:  83 year old male status post fall backwards while caring groceries up stairs. On anti coagulation for a fib. Lethargic and confused on arrival.  Initial encounter. EXAM: CT HEAD WITHOUT CONTRAST CT CERVICAL SPINE WITHOUT CONTRAST TECHNIQUE: Multidetector CT imaging of the head and cervical spine was performed following the standard protocol without intravenous contrast. Multiplanar CT image reconstructions of the cervical spine were also generated. COMPARISON:  Cervical spine radiographs 07/08/2016. FINDINGS: CT HEAD FINDINGS Brain: Small to moderate volume subarachnoid hemorrhage throughout the left convexity. There is a 5 mm hyperdense extra-axial hemorrhage along the left lateral convexity which could be subdural or a small epidural hematoma. There is a contralateral right parafalcine and tentorial subdural hematoma measuring up to 10 mm in thickness, predominantly posterior. This right side subdural hematoma also wraps around the inferior and lateral right hemisphere measuring up to 5 mm in thickness laterally and superiorly. Superimposed trace right vertex subarachnoid hemorrhage. No basilar cistern subarachnoid hemorrhage. No intraventricular hemorrhage or ventriculomegaly. No midline shift at this time. Basilar cisterns remain patent. No posterior fossa hemorrhage. No cortically based acute infarct identified. Vascular: Calcified atherosclerosis at the skull base. Skull: Osteopenia. Non depressed left sphenoid wing and squamous ule anterior temporal bone skull fracture. This tracks cephalad and is mildly comminuted toward the vertex. The fracture appears to terminated along the lateral aspect of the left middle cranial fossa. There is a nearby a nondisplaced left zygomatic arch fracture. No more central skullbase fracture is identified. However, there is also a nondisplaced right zygomatic arch fracture as well as a mildly displaced right lateral orbital wall fracture (series 202 images 13 in 22). There might be a trace amount of right intraorbital contusion (image 25). No other orbital wall fracture. Sinuses/Orbits: Small fluid levels in the left  sphenoid sinus which appear possibly hyperdense such as related to hemorrhage. Similar fluid level in the posterior left ethmoid air cell. Small lower density fluid level in the right maxillary sinus with alveolar recess mucosal thickening associated. Mild opacification of the and appear ear left mastoid air cells. The left tympanic cavity and remaining mastoids are clear. The right tympanic cavity and right mastoids are clear. Other: Large broad-based left scalp hematoma measuring up to 12 mm in thickness. Other scalp soft tissues appear normal. Negative orbits soft tissues. Negative visualized deep soft tissue spaces of the face except for a small volume of gas in the left masticator space. CT CERVICAL SPINE FINDINGS Osteopenia. Occipital condyles -C1 alignment is preserved. 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 There is associated disruption of the right C2-C3 facet joint, with an avulsed fragment off of the posterior C3 facet (sagittal image 29). There is subsequent mild retrolisthesis of the right side body of C2 on C3. There is associated epidural hemorrhage. There is associated C2 level spinal stenosis which is probably mild. The C3 body and pedicles are intact. There is a mildly displaced C3 spinous process fracture in addition to the small  avulsion from the posterior right facet. The C4 vertebra is intact.  The C5 vertebra is intact. The C6 vertebra appears intact. Possible mild compression fracture of the C7 superior endplate. There are questionable nondisplaced fractures through the bilateral C7 superior articulating facet - but this is probably artifact due to  osteopenia. There is mild to moderate compression of the T3 vertebral body. There is a minimally displaced fracture of the posterior left first rib. There is a comminuted fracture of the right mid clavicle. The other visualized upper thoracic levels appear grossly intact. Negative lung apices.  No cervical prevertebral fluid. IMPRESSION: CT HEAD: 1. Comminuted non-depressed left frontotemporal skull fracture. Bilateral zygomatic arch fractures. Right lateral orbital wall fracture. 2. Five mm left lateral epidural versus subdural hematoma. Right subdural hematoma with 10 mm parafalcine and 5 mm peripheral components. 3. Left greater than right subarachnoid hemorrhage. 4. No midline shift at this time. No intraventricular hemorrhage or ventriculomegaly. No hemorrhagic cerebral contusion identified at this time. 5. Small volume of hemorrhage in the sphenoid sinuses but no central skullbase fracture identified. CT CERVICAL SPINE: 1. Severe C2 fracture is impacted, comminuted, and displaced. Associated disruption of the right C2-C3 facet joint. Subsequent C2 level epidural hematoma and multifactorial spinal stenosis (probably mild at this time). 2. Small avulsion fractures of the posterior right C3 facet and the C3 spinous process. 3. Suspected mild compression fracture of the C7 superior endplate. Questionable nondisplaced bilateral C7 superior articulating facet fractures - but favor artifact due to osteopenia instead. 4. Age indeterminate compression of the T3 vertebral body. 5. Mildly displaced posterior left first rib fracture. Critical Value/emergent results were called by telephone at the time of interpretation on 08/23/2016 at 1954 hours to Dr. Erma Heritage , who verbally acknowledged these results. Electronically Signed   By: Odessa Fleming M.D.   On: 08/24/2016 20:09   Ct Abdomen Pelvis W Contrast  Result Date: 09/12/2016 CLINICAL DATA:  Pain after fall EXAM: CT CHEST, ABDOMEN, AND PELVIS WITH CONTRAST TECHNIQUE:  Multidetector CT imaging of the chest, abdomen and pelvis was performed following the standard protocol during bolus administration of intravenous contrast. CONTRAST:  ISOVUE-300 IOPAMIDOL (ISOVUE-300) INJECTION 61% COMPARISON:  04/13/2016 and 03/16/2008 CT abdomen and pelvis FINDINGS: CT CHEST FINDINGS Cardiovascular: Stable cardiomegaly without pericardial effusion. 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 What is indeterminate unfortunately is a whether there is an ascending aortic component due to motion. Vascular surgery consultation is recommended. Would have a low threshold for repeat imaging, with cardiac gating that may help solve this dilemma. Much of the dissection from the arch distal is thrombosed, with probable fenestration causing opacification on both sides of the dissection along the descending aorta. No mediastinal hematoma. No aneurysm. No large central pulmonary embolus. Coronary arteriosclerosis. Mediastinum/Nodes: No adenopathy. Normal appearance of the thoracic esophagus. Trachea is patent. Lungs/Pleura: Small nodular densities measuring up to 7 mm are identified predominantly on the right and in the upper lobe. Study is somewhat limited to motion artifacts. Some the nodular densities may represent branch points for pulmonary vessels especially in the lower right upper lobe adjacent to the minor fissure. Dependent atelectasis noted. Musculoskeletal: Multiple chronic thoracolumbar compression fractures, with slight loss of height at T3 since prior exam but stable along the remainder to at least the L1 level. Chronic fractures involve T3  and T6 through L1. Go CT ABDOMEN PELVIS FINDINGS Hepatobiliary: 3 cm indeterminate hypodense lesion of the right hepatic lobe with Hounsfield units not definitive for simple  cyst but given lack of significant change from 2009 is likely benign finding possibly complex cyst. No biliary dilatation. No solid enhancing mass lesions seen. Pancreas: No pancreatic mass or ductal dilatation. Spleen: No splenomegaly or focal mass.  No splenic laceration. Adrenals/Urinary Tract: Unremarkable adrenal glands. No obstructive uropathy or mass. Physiologic distention of the bladder. Stomach/Bowel: No acute inflammation or bowel obstruction. Appendectomy by report. Vascular/Lymphatic: Aortic and branch vessel atherosclerosis. No lymphadenopathy. Reproductive: Enlarged prostate with peripheral zone calcifications of the prostate measures approximately 6.1 x 5.2 x 5.2 cm (volume = 86 cm^3) Other: Mild diastases of the rectus muscles. Musculoskeletal: Chronic L3 through L5 compression fractures. Stable L1 moderate compression fractures since prior chest CT dated 04/13/2016. Mild superior endplate compression of L2 since 2009 but incompletely imaged on recent CT. IMPRESSION: 1. 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 What is indeterminate unfortunately is a whether there is an ascending aortic component due to motion. Vascular surgery consultation is recommended. Would have a low threshold for repeat imaging, with cardiac gating that may help solve this dilemma. Much of the dissection from the arch distal is thrombosed, with probable fenestration causing opacification on both sides of the dissection along the descending aorta. Critical Value/emergent results were called by telephone at the time of interpretation on 09/05/2016 at 8:51 pm to Dr. Ovid Curd PAGE , who verbally acknowledged these results. 2. Relatively stable nodular opacities within the lungs more so on the right since recent comparison study, the largest measuring  up to 7 mm. Given relative stability would recommend follow-up in 18-24 months from the initial CT dated 04/13/2016 per Fleischner Society recommendations. 3. Multiple thoracolumbar compression fractures are again noted slightly more accentuated at T3 since prior exam. An L2 compression is also noted which is new since the 2009 CT. 4. Approximately 3 cm right hepatic hypodensity grossly stable since 2009 and likely benign finding not definitively a simple cyst by Hounsfield unit criteria. Electronically Signed   By: Ashley Royalty M.D.   On: 08/27/2016 20:57    ROS:ROS 12 systems reviewed and negative except as stated in HPI   Blood pressure 99/85, pulse (!) 168, temperature 98.9 F (37.2 C), temperature source Axillary, resp. rate (!) 31, height '6\' 4"'$  (1.93 m), weight 112.9 kg (248 lb 14.4 oz), SpO2 94 %.  PHYSICAL EXAM: General appearance - patient sedated and minimally responsive. Ears - bilateral auricular hematomas, greater on the right. No evidence of acute change or infection. Nose - deviated nasal septum with dry nasal mucosa and some crusted blood. No septal hematoma or acute injury. Mouth - mucous membranes moist, pharynx normal without lesions and No trismus or limitation in jaw movement. Neck - cervical collar in place, no evidence of hematoma or mass. Face-the patient has mild bilateral periorbital ecchymosis, greater on the right. No palpable fracture, no laceration or other concern.  Studies Reviewed: Facial CT scan from admission on 09/20/2016 shows deviated nasal septum which appears old, bilateral nondisplaced zygomatic fractures. Minimally displaced right lateral orbital fracture without evidence of impingement.  Assessment/Plan: Patient presents the hospital with severe traumatic injuries including intracranial bleeding, cervical spinal fracture and facial fractures. The patient's facial  fractures are nondisplaced and would not require any surgical intervention. The patient has  bilateral auricular hematomas which are stable and associated with trauma and anticoagulant therapy. No intervention required. The patient has extensive injuries and is currently on palliative care. If he recovers or there are other significant changes we would be happy to reevaluate the patient and perhaps make further interventions. No additional treatment required at this time.  [place], Jibri Schriefer 2016/09/19, 12:04 PM

## 2016-09-22 NOTE — Progress Notes (Signed)
  Received call from patient's nurse that HR is in the 150's - 170's, similar to prior episodes of SVT. Occurred throughout the night (see prior Progress notes). Will give second dose of Digoxin now. If no improvement in symptoms, will give low-dose Lopressor.   Signed, Erma Heritage, PA-C 2016-10-01, 7:40 AM

## 2016-09-22 NOTE — Progress Notes (Signed)
Morphine 1 mg wasted in sink and Versed 1 mg (full dose) wasted in sink without being given due to hypotension (1/18 at 0217AM) with Matilde Haymaker.

## 2016-09-22 NOTE — Progress Notes (Signed)
DAILY PROGRESS NOTE  Subjective:  Patient went into a narrow complex tachycardia overnight with sustained rates of 150-160's. SBP decreased and labetalol gtts was held. Given digoxin overnight.   Objective:  Temp:  [98.6 F (37 C)-99.4 F (37.4 C)] 98.6 F (37 C) (01/18 0400) Pulse Rate:  [78-169] 162 (01/18 0800) Resp:  [12-33] 33 (01/18 0800) BP: (79-163)/(53-113) 96/82 (01/18 0800) SpO2:  [92 %-99 %] 95 % (01/18 0800) Weight change:   Intake/Output from previous day: 01/17 0701 - 01/18 0700 In: 2005.6 [I.V.:2005.6] Out: 950 [Urine:950]  Intake/Output from this shift: Total I/O In: 75 [I.V.:75] Out: -   Medications: No current facility-administered medications on file prior to encounter.    Current Outpatient Prescriptions on File Prior to Encounter  Medication Sig Dispense Refill  . apixaban (ELIQUIS) 5 MG TABS tablet Take 5 mg by mouth 2 (two) times daily.    Marland Kitchen diltiazem (CARDIZEM CD) 120 MG 24 hr capsule Take 1 capsule (120 mg total) by mouth daily. 90 capsule 3  . dofetilide (TIKOSYN) 125 MCG capsule Take 375 mcg by mouth 2 (two) times daily.     . furosemide (LASIX) 20 MG tablet Take one tablet (20 mg) by mouth once daily as needed for swelling/ shortness of breath 30 tablet 2  . metoprolol (LOPRESSOR) 50 MG tablet Take 50 mg by mouth 2 (two) times daily.    . Multiple Vitamins-Minerals (CENTRUM) tablet Take 1 tablet by mouth daily.        Physical Exam: General appearance: confused, disoriented, in c-collar Lungs: clear to auscultation bilaterally Heart: regular rate and rhythm and tachycardic Extremities: extremities normal, atraumatic, no cyanosis or edema Neurologic: Mental status: confused, mitts on, in c-collar, C2 spinal injury  Lab Results: Results for orders placed or performed during the hospital encounter of 08/23/2016 (from the past 48 hour(s))  Comprehensive metabolic panel     Status: Abnormal   Collection Time: 09/09/2016  6:33 PM  Result  Value Ref Range   Sodium 138 135 - 145 mmol/L   Potassium 3.4 (L) 3.5 - 5.1 mmol/L   Chloride 104 101 - 111 mmol/L   CO2 21 (L) 22 - 32 mmol/L   Glucose, Bld 120 (H) 65 - 99 mg/dL   BUN 17 6 - 20 mg/dL   Creatinine, Ser 0.99 0.61 - 1.24 mg/dL   Calcium 9.6 8.9 - 10.3 mg/dL   Total Protein 7.2 6.5 - 8.1 g/dL   Albumin 3.9 3.5 - 5.0 g/dL   AST 48 (H) 15 - 41 U/L   ALT 43 17 - 63 U/L   Alkaline Phosphatase 116 38 - 126 U/L   Total Bilirubin 0.6 0.3 - 1.2 mg/dL   GFR calc non Af Amer >60 >60 mL/min   GFR calc Af Amer >60 >60 mL/min    Comment: (NOTE) The eGFR has been calculated using the CKD EPI equation. This calculation has not been validated in all clinical situations. eGFR's persistently <60 mL/min signify possible Chronic Kidney Disease.    Anion gap 13 5 - 15  CBC     Status: Abnormal   Collection Time: 08/30/2016  6:33 PM  Result Value Ref Range   WBC 15.1 (H) 4.0 - 10.5 K/uL   RBC 4.65 4.22 - 5.81 MIL/uL   Hemoglobin 14.8 13.0 - 17.0 g/dL   HCT 44.0 39.0 - 52.0 %   MCV 94.6 78.0 - 100.0 fL   MCH 31.8 26.0 - 34.0 pg   MCHC 33.6 30.0 -  36.0 g/dL   RDW 13.2 11.5 - 15.5 %   Platelets 251 150 - 400 K/uL  Protime-INR     Status: None   Collection Time: 09/18/2016  6:33 PM  Result Value Ref Range   Prothrombin Time 14.2 11.4 - 15.2 seconds   INR 1.09   I-Stat Chem 8, ED     Status: Abnormal   Collection Time: 08/28/2016  7:18 PM  Result Value Ref Range   Sodium 140 135 - 145 mmol/L   Potassium 3.2 (L) 3.5 - 5.1 mmol/L   Chloride 103 101 - 111 mmol/L   BUN 21 (H) 6 - 20 mg/dL   Creatinine, Ser 1.00 0.61 - 1.24 mg/dL   Glucose, Bld 122 (H) 65 - 99 mg/dL   Calcium, Ion 1.13 (L) 1.15 - 1.40 mmol/L   TCO2 25 0 - 100 mmol/L   Hemoglobin 15.0 13.0 - 17.0 g/dL   HCT 44.0 39.0 - 52.0 %  I-Stat CG4 Lactic Acid, ED     Status: Abnormal   Collection Time: 09/16/2016  7:18 PM  Result Value Ref Range   Lactic Acid, Venous 4.69 (HH) 0.5 - 1.9 mmol/L   Comment NOTIFIED PHYSICIAN     Ethanol     Status: None   Collection Time: 08/25/2016  7:30 PM  Result Value Ref Range   Alcohol, Ethyl (B) <5 <5 mg/dL    Comment:        LOWEST DETECTABLE LIMIT FOR SERUM ALCOHOL IS 5 mg/dL FOR MEDICAL PURPOSES ONLY   Urinalysis, Routine w reflex microscopic     Status: Abnormal   Collection Time: 09/13/2016  9:13 PM  Result Value Ref Range   Color, Urine YELLOW YELLOW   APPearance CLEAR CLEAR   Specific Gravity, Urine 1.040 (H) 1.005 - 1.030   pH 5.0 5.0 - 8.0   Glucose, UA 50 (A) NEGATIVE mg/dL   Hgb urine dipstick SMALL (A) NEGATIVE   Bilirubin Urine NEGATIVE NEGATIVE   Ketones, ur 5 (A) NEGATIVE mg/dL   Protein, ur NEGATIVE NEGATIVE mg/dL   Nitrite NEGATIVE NEGATIVE   Leukocytes, UA NEGATIVE NEGATIVE   RBC / HPF 0-5 0 - 5 RBC/hpf   WBC, UA 0-5 0 - 5 WBC/hpf   Bacteria, UA NONE SEEN NONE SEEN   Squamous Epithelial / LPF NONE SEEN NONE SEEN   Mucous PRESENT   CBC     Status: Abnormal   Collection Time: 09/07/16  3:16 AM  Result Value Ref Range   WBC 11.9 (H) 4.0 - 10.5 K/uL   RBC 4.25 4.22 - 5.81 MIL/uL   Hemoglobin 13.4 13.0 - 17.0 g/dL   HCT 39.5 39.0 - 52.0 %   MCV 92.9 78.0 - 100.0 fL   MCH 31.5 26.0 - 34.0 pg   MCHC 33.9 30.0 - 36.0 g/dL   RDW 13.1 11.5 - 15.5 %   Platelets 165 150 - 400 K/uL  Basic metabolic panel     Status: Abnormal   Collection Time: 09/07/16  3:16 AM  Result Value Ref Range   Sodium 135 135 - 145 mmol/L   Potassium 4.1 3.5 - 5.1 mmol/L    Comment: NO VISIBLE HEMOLYSIS   Chloride 103 101 - 111 mmol/L   CO2 18 (L) 22 - 32 mmol/L   Glucose, Bld 191 (H) 65 - 99 mg/dL   BUN 13 6 - 20 mg/dL   Creatinine, Ser 1.03 0.61 - 1.24 mg/dL   Calcium 9.1 8.9 - 10.3 mg/dL   GFR  calc non Af Amer >60 >60 mL/min   GFR calc Af Amer >60 >60 mL/min    Comment: (NOTE) The eGFR has been calculated using the CKD EPI equation. This calculation has not been validated in all clinical situations. eGFR's persistently <60 mL/min signify possible Chronic  Kidney Disease.    Anion gap 14 5 - 15  Magnesium     Status: Abnormal   Collection Time: 09/07/16  3:16 AM  Result Value Ref Range   Magnesium 1.5 (L) 1.7 - 2.4 mg/dL  Lactic acid, plasma     Status: Abnormal   Collection Time: 09/07/16 11:21 AM  Result Value Ref Range   Lactic Acid, Venous 4.6 (HH) 0.5 - 1.9 mmol/L    Comment: CRITICAL RESULT CALLED TO, READ BACK BY AND VERIFIED WITH: M.GOLDING,RN 1221 09/07/16 CLARK,S     Imaging: Ct Head Wo Contrast  Result Date: 09/10/2016 CLINICAL DATA:  83 y/o M; status post fall with intracranial hemorrhage. EXAM: CT HEAD WITHOUT CONTRAST TECHNIQUE: Contiguous axial images were obtained from the base of the skull through the vertex without intravenous contrast. COMPARISON:  None. FINDINGS: Brain: Interval increase in size of the subdural hematoma overlying the right convexity and extending along the falx and right-sided tentorium cerebelli measuring up to 16 mm in thickness along the dorsal falx, previously 10 mm, and at the 10 mm over the convexity. There has been interval redistribution of subarachnoid hemorrhage not present over the convexities bilaterally and overlying the cerebellar hemispheres. The subdural hemorrhage that was previously lying over the left sylvian fissure now layers dependently over the left parietal lobe and is stable in size. There is no evidence for brain parenchymal hemorrhage or large territory infarct. There is mild effacement of the right lateral ventricle and slight right to left midline shift of 3 mm (series 2, image 19). Vascular: Extensive calcification of the cavernous internal carotid arteries. Skull: Stable nondisplaced fracture extending from sphenoid wing through the temporal squama to the vertex. Nondisplaced fracture of the right zygoma mildly buckled fracture of the right lateral wall of the orbit. Nondisplaced fracture of the left zygomatic arch and slight buckling of the left lateral wall of the maxillary sinus.  Increase in size of subgaleal scalp hematoma extending over the convexities bilaterally in greatest concentration in the left temporal parietal region. Sinuses/Orbits: Paranasal sinus mucosal thickening with fluid levels and ethmoid, sphenoid, and right maxillary sinuses. Partial effusion of the left mastoid air cells. No acute intraorbital process identified. Other: None. IMPRESSION: 1. Interval moderate increase in subdural hematoma overlying the right convexity and extending along the falx and right-sided tentorium cerebelli. 2. Mild increase in mass effect with partial effacement of the right lateral ventricle and 3 mm of right-to-left midline shift. 3. Interval redistribution of subarachnoid hemorrhage with increased blood over the right convexity and over the cerebellar hemispheres. 4. Stable small left subdural hematoma redistributed over the left parietal lobe. 5. Stable skull and facial fractures as described. 6. Increasing fluid levels in the paranasal sinuses may represent hemorrhage. 7. Increase in size of subgaleal scalp hematoma extending over the convexities bilaterally in greatest concentration in the left temporal parietal region. These results will be called to the ordering clinician or representative by the Radiologist Assistant, and communication documented in the PACS or zVision Dashboard. Electronically Signed   By: Kristine Garbe M.D.   On: 09-10-16 02:28   Ct Head Wo Contrast  Result Date: 08/29/2016 CLINICAL DATA:  83 year old male status post fall backwards while caring  groceries up stairs. On anti coagulation for a fib. Lethargic and confused on arrival. Initial encounter. EXAM: CT HEAD WITHOUT CONTRAST CT CERVICAL SPINE WITHOUT CONTRAST TECHNIQUE: Multidetector CT imaging of the head and cervical spine was performed following the standard protocol without intravenous contrast. Multiplanar CT image reconstructions of the cervical spine were also generated. COMPARISON:   Cervical spine radiographs 07/08/2016. FINDINGS: CT HEAD FINDINGS Brain: Small to moderate volume subarachnoid hemorrhage throughout the left convexity. There is a 5 mm hyperdense extra-axial hemorrhage along the left lateral convexity which could be subdural or a small epidural hematoma. There is a contralateral right parafalcine and tentorial subdural hematoma measuring up to 10 mm in thickness, predominantly posterior. This right side subdural hematoma also wraps around the inferior and lateral right hemisphere measuring up to 5 mm in thickness laterally and superiorly. Superimposed trace right vertex subarachnoid hemorrhage. No basilar cistern subarachnoid hemorrhage. No intraventricular hemorrhage or ventriculomegaly. No midline shift at this time. Basilar cisterns remain patent. No posterior fossa hemorrhage. No cortically based acute infarct identified. Vascular: Calcified atherosclerosis at the skull base. Skull: Osteopenia. Non depressed left sphenoid wing and squamous ule anterior temporal bone skull fracture. This tracks cephalad and is mildly comminuted toward the vertex. The fracture appears to terminated along the lateral aspect of the left middle cranial fossa. There is a nearby a nondisplaced left zygomatic arch fracture. No more central skullbase fracture is identified. However, there is also a nondisplaced right zygomatic arch fracture as well as a mildly displaced right lateral orbital wall fracture (series 202 images 13 in 22). There might be a trace amount of right intraorbital contusion (image 25). No other orbital wall fracture. Sinuses/Orbits: Small fluid levels in the left sphenoid sinus which appear possibly hyperdense such as related to hemorrhage. Similar fluid level in the posterior left ethmoid air cell. Small lower density fluid level in the right maxillary sinus with alveolar recess mucosal thickening associated. Mild opacification of the and appear ear left mastoid air cells. The  left tympanic cavity and remaining mastoids are clear. The right tympanic cavity and right mastoids are clear. Other: Large broad-based left scalp hematoma measuring up to 12 mm in thickness. Other scalp soft tissues appear normal. Negative orbits soft tissues. Negative visualized deep soft tissue spaces of the face except for a small volume of gas in the left masticator space. CT CERVICAL SPINE FINDINGS Osteopenia. Occipital condyles -C1 alignment is preserved. 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 There is associated disruption of the right C2-C3 facet joint, with an avulsed fragment off of the posterior C3 facet (sagittal image 29). There is subsequent mild retrolisthesis of the right side body of C2 on C3. There is associated epidural hemorrhage. There is associated C2 level spinal stenosis which is probably mild. The C3 body and pedicles are intact. There is a mildly displaced C3 spinous process fracture in addition to the small avulsion from the posterior right facet. The C4 vertebra is intact.  The C5 vertebra is intact. The C6 vertebra appears intact. Possible mild compression fracture of the C7 superior endplate. There are questionable nondisplaced fractures through the bilateral C7 superior articulating facet - but this is probably artifact due to  osteopenia. There is mild to moderate compression of the T3 vertebral body. There is a minimally displaced fracture of the posterior left first rib. There is a comminuted fracture of the right mid clavicle. The other visualized upper thoracic levels appear grossly intact. Negative lung apices.  No cervical prevertebral  fluid. IMPRESSION: CT HEAD: 1. Comminuted non-depressed left frontotemporal skull fracture. Bilateral zygomatic arch fractures. Right lateral orbital wall fracture. 2. Five mm left lateral epidural versus subdural hematoma. Right subdural hematoma with 10 mm parafalcine and 5 mm peripheral components. 3. Left greater than right subarachnoid hemorrhage. 4. No midline shift at this time. No intraventricular hemorrhage or ventriculomegaly. No hemorrhagic cerebral contusion identified at this time. 5. Small volume of hemorrhage in the sphenoid sinuses but no central skullbase fracture identified. CT CERVICAL SPINE: 1. Severe C2 fracture is impacted, comminuted, and displaced. Associated disruption of the right C2-C3 facet joint. Subsequent C2 level epidural hematoma and multifactorial spinal stenosis (probably mild at this time). 2. Small avulsion fractures of the posterior right C3 facet and the C3 spinous process. 3. Suspected mild compression fracture of the C7 superior endplate. Questionable nondisplaced bilateral C7 superior articulating facet fractures - but favor artifact due to osteopenia instead. 4. Age indeterminate compression of the T3 vertebral body. 5. Mildly displaced posterior left first rib fracture. Critical Value/emergent results were called by telephone at the time of interpretation on 09/05/2016 at 1954 hours to Dr. Ellender Hose , who verbally acknowledged these results. Electronically Signed   By: Genevie Ann M.D.   On: 09/21/2016 20:09   Ct Chest W Contrast  Result Date: 09/16/2016 CLINICAL DATA:  Pain after fall EXAM: CT CHEST, ABDOMEN, AND PELVIS WITH CONTRAST TECHNIQUE: Multidetector CT imaging of the chest, abdomen and pelvis was performed following the standard protocol during bolus administration of intravenous contrast. CONTRAST:  123m ISOVUE-300 IOPAMIDOL (ISOVUE-300) INJECTION 61% COMPARISON:  04/13/2016 and 03/16/2008 CT abdomen and pelvis FINDINGS: CT CHEST FINDINGS Cardiovascular: Stable  cardiomegaly without pericardial effusion. 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 What is indeterminate unfortunately is a whether there is an ascending aortic component due to motion. Vascular surgery consultation is recommended. Would have a low threshold for repeat imaging, with cardiac gating that may help solve this dilemma. Much of the dissection from the arch distal is thrombosed, with probable fenestration causing opacification on both sides of the dissection along the descending aorta. No mediastinal hematoma. No aneurysm. No large central pulmonary embolus. Coronary arteriosclerosis. Mediastinum/Nodes: No adenopathy. Normal appearance of the thoracic esophagus. Trachea is patent. Lungs/Pleura: Small nodular densities measuring up to 7 mm are identified predominantly on the right and in the upper lobe. Study is somewhat limited to motion artifacts. Some the nodular densities may represent branch points for pulmonary vessels especially in the lower right upper lobe adjacent to the minor fissure. Dependent atelectasis noted. Musculoskeletal: Multiple chronic thoracolumbar compression fractures, with slight loss of height at T3 since prior exam but stable along the remainder to at least the L1 level. Chronic fractures involve T3 and T6 through L1. Go CT ABDOMEN PELVIS FINDINGS Hepatobiliary: 3 cm indeterminate hypodense lesion of the right hepatic lobe with Hounsfield units not definitive for simple cyst but given lack of significant change from 2009 is likely benign finding possibly complex cyst. No biliary dilatation. No solid enhancing mass lesions seen.  Pancreas: No pancreatic mass or ductal dilatation. Spleen: No splenomegaly or focal mass.  No splenic laceration. Adrenals/Urinary Tract: Unremarkable adrenal glands. No  obstructive uropathy or mass. Physiologic distention of the bladder. Stomach/Bowel: No acute inflammation or bowel obstruction. Appendectomy by report. Vascular/Lymphatic: Aortic and branch vessel atherosclerosis. No lymphadenopathy. Reproductive: Enlarged prostate with peripheral zone calcifications of the prostate measures approximately 6.1 x 5.2 x 5.2 cm (volume = 86 cm^3) Other: Mild diastases of the rectus muscles. Musculoskeletal: Chronic L3 through L5 compression fractures. Stable L1 moderate compression fractures since prior chest CT dated 04/13/2016. Mild superior endplate compression of L2 since 2009 but incompletely imaged on recent CT. IMPRESSION: 1. 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 What is indeterminate unfortunately is a whether there is an ascending aortic component due to motion. Vascular surgery consultation is recommended. Would have a low threshold for repeat imaging, with cardiac gating that may help solve this dilemma. Much of the dissection from the arch distal is thrombosed, with probable fenestration causing opacification on both sides of the dissection along the descending aorta. Critical Value/emergent results were called by telephone at the time of interpretation on 09/13/2016 at 8:51 pm to Dr. Ovid Curd PAGE , who verbally acknowledged these results. 2. Relatively stable nodular opacities within the lungs more so on the right since recent comparison study, the largest measuring up to 7 mm. Given relative stability would recommend follow-up in 18-24 months from the initial CT dated 04/13/2016 per Fleischner Society recommendations. 3. Multiple thoracolumbar compression fractures are again noted slightly more accentuated at T3 since prior exam. An L2 compression is also noted which is new since the 2009  CT. 4. Approximately 3 cm right hepatic hypodensity grossly stable since 2009 and likely benign finding not definitively a simple cyst by Hounsfield unit criteria. Electronically Signed   By: Ashley Royalty M.D.   On: 09/11/2016 20:57   Ct Cervical Spine Wo Contrast  Result Date: 09/11/2016 CLINICAL DATA:  83 year old male status post fall backwards while caring groceries up stairs. On anti coagulation for a fib. Lethargic and confused on arrival. Initial encounter. EXAM: CT HEAD WITHOUT CONTRAST CT CERVICAL SPINE WITHOUT CONTRAST TECHNIQUE: Multidetector CT imaging of the head and cervical spine was performed following the standard protocol without intravenous contrast. Multiplanar CT image reconstructions of the cervical spine were also generated. COMPARISON:  Cervical spine radiographs 07/08/2016. FINDINGS: CT HEAD FINDINGS Brain: Small to moderate volume subarachnoid hemorrhage throughout the left convexity. There is a 5 mm hyperdense extra-axial hemorrhage along the left lateral convexity which could be subdural or a small epidural hematoma. There is a contralateral right parafalcine and tentorial subdural hematoma measuring up to 10 mm in thickness, predominantly posterior. This right side subdural hematoma also wraps around the inferior and lateral right hemisphere measuring up to 5 mm in thickness laterally and superiorly. Superimposed trace right vertex subarachnoid hemorrhage. No basilar cistern subarachnoid hemorrhage. No intraventricular hemorrhage or ventriculomegaly. No midline shift at this time. Basilar cisterns remain patent. No posterior fossa hemorrhage. No cortically based acute infarct identified. Vascular: Calcified atherosclerosis at the skull base. Skull: Osteopenia. Non depressed left sphenoid wing and squamous ule anterior temporal bone skull fracture. This tracks cephalad and is mildly comminuted toward the vertex. The fracture appears to terminated along the lateral aspect of the left  middle cranial fossa.  There is a nearby a nondisplaced left zygomatic arch fracture. No more central skullbase fracture is identified. However, there is also a nondisplaced right zygomatic arch fracture as well as a mildly displaced right lateral orbital wall fracture (series 202 images 13 in 22). There might be a trace amount of right intraorbital contusion (image 25). No other orbital wall fracture. Sinuses/Orbits: Small fluid levels in the left sphenoid sinus which appear possibly hyperdense such as related to hemorrhage. Similar fluid level in the posterior left ethmoid air cell. Small lower density fluid level in the right maxillary sinus with alveolar recess mucosal thickening associated. Mild opacification of the and appear ear left mastoid air cells. The left tympanic cavity and remaining mastoids are clear. The right tympanic cavity and right mastoids are clear. Other: Large broad-based left scalp hematoma measuring up to 12 mm in thickness. Other scalp soft tissues appear normal. Negative orbits soft tissues. Negative visualized deep soft tissue spaces of the face except for a small volume of gas in the left masticator space. CT CERVICAL SPINE FINDINGS Osteopenia. Occipital condyles -C1 alignment is preserved. 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 There is associated disruption of the right C2-C3 facet joint, with an avulsed fragment off of the posterior C3 facet (sagittal image 29). There is subsequent mild retrolisthesis of the right side body of C2 on C3. There is associated epidural hemorrhage. There is associated C2 level spinal stenosis which is probably mild.  The C3 body and pedicles are intact. There is a mildly displaced C3 spinous process fracture in addition to the small avulsion from the posterior right facet. The C4 vertebra is intact.  The C5 vertebra is intact. The C6 vertebra appears intact. Possible mild compression fracture of the C7 superior endplate. There are questionable nondisplaced fractures through the bilateral C7 superior articulating facet - but this is probably artifact due to osteopenia. There is mild to moderate compression of the T3 vertebral body. There is a minimally displaced fracture of the posterior left first rib. There is a comminuted fracture of the right mid clavicle. The other visualized upper thoracic levels appear grossly intact. Negative lung apices.  No cervical prevertebral fluid. IMPRESSION: CT HEAD: 1. Comminuted non-depressed left frontotemporal skull fracture. Bilateral zygomatic arch fractures. Right lateral orbital wall fracture. 2. Five mm left lateral epidural versus subdural hematoma. Right subdural hematoma with 10 mm parafalcine and 5 mm peripheral components. 3. Left greater than right subarachnoid hemorrhage. 4. No midline shift at this time. No intraventricular hemorrhage or ventriculomegaly. No hemorrhagic cerebral contusion identified at this time. 5. Small volume of hemorrhage in the sphenoid sinuses but no central skullbase fracture identified. CT CERVICAL SPINE: 1. Severe C2 fracture is impacted, comminuted, and displaced. Associated disruption of the right C2-C3 facet joint. Subsequent C2 level epidural hematoma and multifactorial spinal stenosis (probably mild at this time). 2. Small avulsion fractures of the posterior right C3 facet and the C3 spinous process. 3. Suspected mild compression fracture of the C7 superior endplate. Questionable nondisplaced bilateral C7 superior articulating facet fractures - but favor artifact due to osteopenia instead. 4. Age indeterminate compression of the T3 vertebral body. 5.  Mildly displaced posterior left first rib fracture.  Critical Value/emergent results were called by telephone at the time of interpretation on 08/30/2016 at 1954 hours to Dr. Erma Heritage , who verbally acknowledged these results. Electronically Signed   By: Odessa Fleming M.D.   On: 08/31/2016 20:09   Ct Abdomen Pelvis W Contrast  Result Date: 08/29/2016 CLINICAL DATA:  Pain after fall EXAM: CT CHEST, ABDOMEN, AND PELVIS WITH CONTRAST TECHNIQUE: Multidetector CT imaging of the chest, abdomen and pelvis was performed following the standard protocol during bolus administration of intravenous contrast. CONTRAST:  ISOVUE-300 IOPAMIDOL (ISOVUE-300) INJECTION 61% COMPARISON:  04/13/2016 and 03/16/2008 CT abdomen and pelvis FINDINGS: CT CHEST FINDINGS Cardiovascular: Stable cardiomegaly without pericardial effusion. 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 What is indeterminate unfortunately is a whether there is an ascending aortic component due to motion. Vascular surgery consultation is recommended. Would have a low threshold for repeat imaging, with cardiac gating that may help solve this dilemma. Much of the dissection from the arch distal is thrombosed, with probable fenestration causing opacification on both sides of the dissection along the descending aorta. No mediastinal hematoma. No aneurysm. No large central pulmonary embolus. Coronary arteriosclerosis. Mediastinum/Nodes: No adenopathy. Normal appearance of the thoracic esophagus. Trachea is patent. Lungs/Pleura: Small nodular densities measuring up to 7 mm are identified predominantly on the right and in the upper lobe. Study is somewhat limited to motion artifacts. Some the nodular densities may represent branch points for pulmonary vessels especially in the lower right upper lobe  adjacent to the minor fissure. Dependent atelectasis noted. Musculoskeletal: Multiple chronic thoracolumbar compression fractures, with slight loss of height at T3 since prior exam but stable along the remainder to at least the L1 level. Chronic fractures involve T3 and T6 through L1. Go CT ABDOMEN PELVIS FINDINGS Hepatobiliary: 3 cm indeterminate hypodense lesion of the right hepatic lobe with Hounsfield units not definitive for simple cyst but given lack of significant change from 2009 is likely benign finding possibly complex cyst. No biliary dilatation. No solid enhancing mass lesions seen. Pancreas: No pancreatic mass or ductal dilatation. Spleen: No splenomegaly or focal mass.  No splenic laceration. Adrenals/Urinary Tract: Unremarkable adrenal glands. No obstructive uropathy or mass. Physiologic distention of the bladder. Stomach/Bowel: No acute inflammation or bowel obstruction. Appendectomy by report. Vascular/Lymphatic: Aortic and branch vessel atherosclerosis. No lymphadenopathy. Reproductive: Enlarged prostate with peripheral zone calcifications of the prostate measures approximately 6.1 x 5.2 x 5.2 cm (volume = 86 cm^3) Other: Mild diastases of the rectus muscles. Musculoskeletal: Chronic L3 through L5 compression fractures. Stable L1 moderate compression fractures since prior chest CT dated 04/13/2016. Mild superior endplate compression of L2 since 2009 but incompletely imaged on recent CT. IMPRESSION: 1. 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 What is indeterminate unfortunately is a whether there is an ascending aortic component due to motion. Vascular surgery consultation is recommended. Would have a low threshold for repeat imaging, with cardiac gating that may help solve this dilemma. Much of the dissection from the  arch distal is thrombosed, with probable fenestration causing opacification on both sides of the dissection along the descending aorta. Critical Value/emergent results were called by telephone at the time of  interpretation on 09/16/2016 at 8:51 pm to Dr. Ovid Curd PAGE , who verbally acknowledged these results. 2. Relatively stable nodular opacities within the lungs more so on the right since recent comparison study, the largest measuring up to 7 mm. Given relative stability would recommend follow-up in 18-24 months from the initial CT dated 04/13/2016 per Fleischner Society recommendations. 3. Multiple thoracolumbar compression fractures are again noted slightly more accentuated at T3 since prior exam. An L2 compression is also noted which is new since the 2009 CT. 4. Approximately 3 cm right hepatic hypodensity grossly stable since 2009 and likely benign finding not definitively a simple cyst by Hounsfield unit criteria. Electronically Signed   By: Ashley Royalty M.D.   On: 09/05/2016 20:57    Assessment:  Active Problems:   Hypertension   PAF (paroxysmal atrial fibrillation) (HCC)   Subdural hematoma, post-traumatic (HCC)   Aortic dissection (HCC)   Plan:  1. Mr. Rolfson appears to be in a narrow complex tachycardia, which I suspect is atrial flutter. He is not on anticoagulation due to trauma and his [name] is being held. Blood pressure remains low. Digoxin was initiated overnight without improvement. Anticipate this will be very difficult to rate control without antiarrythmic medication. He previous had some success on amiodarone, however, it was stopped due to thyroid toxicity. This would be a relative contraindication at this point and I would recommend we re-initiate amiodarone. He has now washed out of tikosyn for >48 hours. D/W Dr. Grandville Silos and he agrees.  Cardiology will follow with you.  Time Spent Directly with Patient:  15 minutes  Length of Stay:  LOS: 2 days   Pixie Casino, MD,  [facility] Attending Cardiologist [place] 2016/09/29, 8:20 AM

## 2016-09-22 NOTE — Progress Notes (Signed)
Checked on patient.  He is having difficulty with rapid breathing and coughing.  Dr. Domingo Cocking examined the patient and offered recommendations.  I stayed with the patient and family for a period of time giving boluses until he appeared more comfortable.  Imogene Burn, [place] [facility] Pager: [phone]

## 2016-09-22 NOTE — Progress Notes (Signed)
Patient ID: Antonio [name], male   DOB: 02/17/1929, 83 y.o.   MRN: [id number] New CT today demonstrates that there is progression of interhemispheric subdural hematoma with significant mass effect on right hemisphere. Neurologically the patient is doing poorly combative when arousable. The decision has been made by the family to pursue palliative measures with comfort care. I am in full agreement with this approach given the multitude of his injuries and the severity of the intracranial and spinal injuries that comfort care is in his best interest. No aggressive surgical intervention is likely to reverse his clinical course.

## 2016-09-22 NOTE — Progress Notes (Signed)
Palliative Care Team met with family this morning.  They have chosen to offer comfort measures for pt.   Wife at bedside; met with her, and offered emotional support.  She states she is at peace with the decision, with God's help.  Will follow.    Reinaldo Raddle, RN, BSN  Trauma/Neuro ICU Case Manager [phone]

## 2016-09-22 NOTE — Progress Notes (Signed)
Follow up - Trauma Critical Care  Patient Details:    Antonio [name] is an 83 y.o. male.  Lines/tubes : External Urinary Catheter (Active)  Collection Container Standard drainage bag 09/07/2016  8:00 PM  Output (mL) 300 mL October 04, 2016 12:00 AM    Microbiology/Sepsis markers: Results for orders placed or performed in visit on 11/19/15  Stool culture     Status: None   Collection Time: 11/19/15  4:48 PM  Result Value Ref Range Status   Organism ID, Bacteria No Salmonella,Shigella,Campylobacter,Yersinia,or  Final   Organism ID, Bacteria No E.coli [id number]:[id number] isolated.  Final    Anti-infectives:  Anti-infectives    None      Consults: Treatment Team:  Kristeen Miss, MD Angelia Mould, MD Rexene Alberts, MD  Mali Hilty, MD  Studies:F/U CT head:IMPRESSION: 1. Interval moderate increase in subdural hematoma overlying the right convexity and extending along the falx and right-sided tentorium cerebelli. 2. Mild increase in mass effect with partial effacement of the right lateral ventricle and 3 mm of right-to-left midline shift. 3. Interval redistribution of subarachnoid hemorrhage with increased blood over the right convexity and over the cerebellar hemispheres. 4. Stable small left subdural hematoma redistributed over the left parietal lobe. 5. Stable skull and facial fractures as described. 6. Increasing fluid levels in the paranasal sinuses may represent hemorrhage. 7. Increase in size of subgaleal scalp hematoma extending over the convexities bilaterally in greatest concentration in the left temporal parietal region.    Events:  Subjective:    Overnight Issues:   Objective:  Vital signs for last 24 hours: Temp:  [98.6 F (37 C)-99.4 F (37.4 C)] 98.6 F (37 C) (01/18 0400) Pulse Rate:  [78-169] 168 (01/18 0830) Resp:  [12-33] 31 (01/18 0830) BP: (79-163)/(53-113) 99/85 (01/18 0830) SpO2:  [92 %-99 %] 94 % (01/18 0830)  Hemodynamic parameters for last  24 hours:    Intake/Output from previous day: 01/17 0701 - 01/18 0700 In: 2005.6 [I.V.:2005.6] Out: 950 [Urine:950]  Intake/Output this shift: Total I/O In: 75 [I.V.:75] Out: -   Vent settings for last 24 hours:    Physical Exam:  General: mild restlessness Neuro: PERL, purposeful but not F/C HEENT/Neck: collar Resp: clear to auscultation bilaterally CVS: SVT 160 GI: soft, NT, ND Extremities: edema 1+  Results for orders placed or performed during the hospital encounter of 09/21/2016 (from the past 24 hour(s))  Lactic acid, plasma     Status: Abnormal   Collection Time: 09/07/16 11:21 AM  Result Value Ref Range   Lactic Acid, Venous 4.6 (HH) 0.5 - 1.9 mmol/L    Assessment & Plan: Present on Admission: . Subdural hematoma, post-traumatic ([place]) . Aortic dissection ([place]) . PAF (paroxysmal atrial fibrillation) ([place]) . Hypertension    LOS: 2 days   Additional comments:I reviewed the patient's new clinical lab test results. and CT head as above Fall  TBI w/SDH, SAH, skull fxs - per Dr. Ellene Route, CT head this AM worse and I reviewed with the family. Multiple facial fxs with right auricular hematoma -- per Dr. Wilburn Cornelia C2,3,7 fxs - Collar per Dr. Ellene Route Left rib fx - Pulmonary toilet Ao dissection - non-operative per Drs. Roxy Manns and Scot Dock SVT - Appreciate cardiology F/U and I spoke with Dr. Debara Pickett. Amiodarone IV FEN - no CorTrak per family VTE - SCD's Dispo - I spoke at length with the family,palliative consult this AM, comfort care is the goal. DNR, DNI. Critical Care Total Time*: 30 Minutes  Georganna Skeans, MD, MPH,  FACS Trauma: [id number] General Surgery: [phone]  09-19-16  *Care during the described time interval was provided by me. I have reviewed this patient's available data, including medical history, events of note, physical examination and test results as part of my evaluation.  Patient ID: Antonio [name], male   DOB: [birth year], 83 y.o.   MRN:  [id number]

## 2016-09-22 NOTE — Progress Notes (Signed)
Stopped by to visit with family members in rm after briefly encountering one leaving in hall. Provided spiritual/emotional support, and ensured nurse knew to call Spiritual Care office for MD house access if needed when [place] family member arrives -- and wife knows family can ask nurse to page for chaplain at any time, esp. when condition of DNR pt appears to be declining.   30-Sep-2016 1200  Clinical Encounter Type  Visited With Patient and family together;Health care provider  Visit Type Follow-up;Psychological support;Spiritual support;Social support;Critical Care  Referral From [place] (Comment) (ensure family member coming from [place] can stay MD house)  Stress Factors  Patient Stress Factors Health changes;Loss of control  Family Stress Factors Family relationships;Health changes;Loss of control   Gerrit Heck, Chaplain

## 2016-09-22 NOTE — Progress Notes (Signed)
Patient was transitioned to comfort care this morning.  His heart rhythm progressed to asystole.  No heart sounds auscultated by Gilford Rile, RN or Harlan Stains, RN.  TOD recorded at 1537.  Patient's wife, children and other relatives were at bedside.   MD, CDS and ME were notified. Patient was prepared and transported to morgue. Antonio [name], Antonio [name]

## 2016-09-22 NOTE — Discharge Summary (Signed)
Physician Death Summary  Patient ID: Antonio [name] MRN: [id number] DOB/AGE: 81/02/1929 81 y.o.  Admit date: 09-23-16 Date of death: 2016/09/25  Discharge Diagnoses Patient Active Problem List   Diagnosis Date Noted  . Acute subdural hematoma (HCC)   . Subarachnoid hemorrhage ([place])   . Palliative care encounter   . Subdural hematoma, post-traumatic ([place]) 09-23-16  . Aortic dissection ([place]) 09/23/16  . Right knee pain 07/17/2016  . Persistent atrial fibrillation ([place]) 04/11/2016  . Encounter for therapeutic drug monitoring 02/15/2016  . Goals of care, counseling/discussion 07/08/2015  . Cough 06/29/2012  . Annual physical exam 06/29/2011  . BPH (benign prostatic hyperplasia) 06/29/2011  . Abnormal TSH 06/28/2010  . HYPERLIPIDEMIA 12/30/2008  . Skin lesion 12/30/2008  . Hypertension 10/08/2007  . 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 PAF (paroxysmal atrial fibrillation) ([place]) 10/08/2007  . GLAUCOMA NOS 02/06/2007    Consultants Dr. Kristeen Miss for neurosurgery  Dr. Cristina Gong for cardiology  Dr. Deitra Mayo for vascular surgery  Dr. Darylene Price for cardiothoracic surgery  Dr. Micheline Rough for palliative care  Dr. Jerrell Belfast for ENT   Procedures None   HPI: Antonio [name] presented after a fall.  He was walking up some stairs carrying groceries when he fell onto his face. There was a questionable loss of consciousness. He had significant pain to his face, head, and neck. He was brought in as a non-trauma code and upgraded to a level II by the EDP. He was anticoagulated on Eliquis for atrial fibrillation. His workup included CT scans of the head, cervical spine, chest, abdomen, and pelvis as well as extremity x-rays which showed the above-mentioned injuries. He was admitted to the trauma service and multiple specialists were consulted.   Hospital Course: All of the surgical specialists recommended non-operative treatment, if for no other reason than they did  not think the patient could survive his injuries or the surgeries. He continued to decline over the next 24 hours as expected. Palliative care met with the family who were clear that they wanted comfort care only. He died later that day.    Signed: Lisette Abu, PA-C Pager: [phone] General Trauma PA Pager: [phone] 09/16/2016, 2:15 PM

## 2016-09-22 NOTE — Progress Notes (Signed)
Paged B Strader, PA, to update on patient's status.  SVT rate still unchanged - sustaining around 163-166 -- after giving .25 mg digoxin at 0731 and 2.5mg  lopressor at 0749. Ahmed Prima said she will consult with Dr. Debara Pickett and call back. I will continue to monitor patient's status. Marine Lezotte C 8:06 AM

## 2016-09-22 NOTE — Progress Notes (Signed)
Wasted 38ml of versed and 225 mol of morphine in sink.  Leland Johns, RN witnessed. Jessee Newnam C 4:49 PM

## 2016-09-22 NOTE — Progress Notes (Signed)
Patient returned from CT and experienced onset of tachycardia sustaining in the 150s. BP decreased from 160/90 to 90/60, drop in O2 sat. Patient O2 increased to 5L [place], EKG and on call cards was paged, Dr Radford Pax responded. 200 cc bolus ordered and Digoxin 0.25 mg IV Q 6 hr for 2 doses. Will place orders and continue to monitor patient.

## 2016-09-22 NOTE — Consult Note (Signed)
Consultation Note Date: Oct 05, 2016   Patient Name: Antonio [name]  DOB: Oct 08, 1928  MRN: [id number]  Age / Sex: 83 y.o., male  PCP: Colon Branch, MD Referring Physician: Trauma Md, MD  Reason for Consultation: Establishing goals of care, Non pain symptom management and Psychosocial/spiritual support  HPI/Patient Profile: 83 y.o. male  with past medical history of atrial fibrillation, cardiomyopathy, and recent knee fracture who was admitted on 09/05/2016 after a traumatic fall.  The patient fell down the stairs at home.  On exam and imaging he is found to have multiple fractures of his face, skull and c-spine.  Neuro surgery indicates that he has a traumatic brain injury.  He has  significant SDH and SAH as well as a thoracic aortic dissection.  He is in sustained SVT  Clinical Assessment and Goals of Care: I met at bedside with the patient, his wife, dtr, son and son in law.  Palliative Medicine was introduced as specialized medical care for people living with serious illness. It focuses on providing relief from the symptoms and stress of a serious illness. The goal is to improve quality of life for both the patient and the family.  We briefly reviewed Mr. Whitmire life and is most recent trauma.  The family expressed in no uncertain terms that they want him to be comfortable.  "He did not want to be kept around". I reassured them that his injuries were extensive and he would have a poor quality of life should he survive.  We discussed his daughter who is flying in from [place] - the family felt strongly that he should be made comfortable even if it hastened his death.  Antonio [name]'s wife and daughter have been with him since admission and feel he is suffering.   Primary Decision Maker:  NEXT OF KIN wife, Antonio [name]    SUMMARY OF RECOMMENDATIONS    Code Status/Advance Care Planning:  DNR    Symptom Management:    Agitation:  Versed gtt and prn boluses  Dyspnea and pain:  Morphine gtt and prn boluses  Secretions:  Robinul scheduled and PRN  Will d/c all medications and interventions not related to comfort   Will leave hard collar in place for now until patient is very sleepy  Additional Recommendations (Limitations, Scope, Preferences):  Full Comfort Care  Palliative Prophylaxis:   Aspiration and Frequent Pain Assessment  Psycho-social/Spiritual:   Desire for further Chaplaincy support: - per family "we have this covered".  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  Prognosis:   Hours - Days  Discharge Planning: Anticipated Hospital Death - this patient can not be transported outside of the hospital.      Primary Diagnoses: Present on Admission: . Subdural hematoma, post-traumatic ([place]) . Aortic dissection ([place]) . PAF (paroxysmal atrial fibrillation) ([place]) . Hypertension   I have reviewed the medical record, interviewed the patient and family, and examined the patient. The following aspects are pertinent.  Past Medical History:  Diagnosis Date  . Anemia   .  Aortic dissection ([place]) 09/14/2016  . Appendicitis    SBP 7/09 ruputre w/ RLQ abscess 03/16/08  . Atrial fibrillation ([place])   . Basal cell carcinoma of nose 2016   Right side nasal tip, [name]  . BPH (benign prostatic hyperplasia) 06/29/2011  . Cardiomyopathy    secondary to Tachycardia. A-Acute on chronic admit for CHF 6/08. B- CHF coupled with atrial fib RVR electrophysiology study 09/27/07.  A- Multiple flutters and Atrial fib- no ablation performed b- ibutilide cardoversion c- amiordarone load- d/c in sinus rhyrthm d- coumadin anticoagulation- stopped at time of GI bleed 11/09  . Glaucoma   . History of blood transfusion 2009   "related to rupture in esophagus" (04/13/2016)  . Hyperlipidemia   . Hypertension   . Knee fracture, right 08/2014   "no OR" (04/13/2016)  . Lung nodules    [name] 04/13/2016  . [place] -  Weiss tear    esophagitis, 6 unit hemorrhage- EPI injections   . Small bowel obstruction    Social History   Social History  . Marital status: Married    Spouse name: N/A  . Number of children: 3  . Years of education: N/A   Occupational History  . retired --Catering manager     works part time   Social History Main Topics  . Smoking status: Former Smoker    Packs/day: 1.00    Years: 23.00    Quit date: 06/30/1971  . Smokeless tobacco: Never Used  . Alcohol use No  . Drug use: No  . Sexual activity: No   Other Topics Concern  . None   Social History Narrative   Household-- pt and wife (wife developing dementia)   One son lives in [place], goes there from time to time             Family History  Problem Relation Age of Onset  . Uterine cancer Mother   . Heart attack Maternal Uncle   . Heart disease Maternal Uncle   . Colon cancer Neg Hx   . Prostate cancer Neg Hx   . Diabetes Neg Hx    Scheduled Meds: . chlorhexidine  15 mL Mouth Rinse BID  . mouth rinse  15 mL Mouth Rinse q12n4p  . sodium chloride flush  3 mL Intravenous Q12H   Continuous Infusions: . labetalol (NORMODYNE) infusion Stopped (2016-09-22 0215)  . midazolam (VERSED) infusion    . morphine     PRN Meds:.sodium chloride, acetaminophen **OR** acetaminophen, antiseptic oral rinse, glycopyrrolate **OR** glycopyrrolate **OR** glycopyrrolate, haloperidol **OR** haloperidol **OR** haloperidol lactate, HYDROmorphone (DILAUDID) injection, midazolam, midazolam, morphine injection, morphine, ondansetron **OR** ondansetron (ZOFRAN) IV, polyvinyl alcohol, sodium chloride flush Allergies  Allergen Reactions  . Amiodarone Other (See Comments)    Thyroid toxicity  . Fentanyl Rash    Possible source of a reaction he has experienced (given Benadryl to counteract)  . Kcentra [Prothrombin Complex Conc Human] Rash    Possible source of a reaction he has experienced (given Benadryl to counteract)   Review of Systems  patient unable.  Physical Exam  Wd, Wn male, in hard cervical collar, right ear bruised and swollen. CV tachy (160s) Resp:  Labored and quick (30+ per min) Abdomen soft NT Extremities no edema.    Vital Signs: BP 99/85   Pulse (!) 168   Temp 98.9 F (37.2 C) (Axillary)   Resp (!) 31   Ht _0  (1.93 m)   Wt 112.9 kg (248 lb 14.4 oz)   SpO2  94%   BMI 30.30 kg/m  Pain Assessment: CPOT     SpO2: SpO2: 94 % O2 Device:SpO2: 94 % O2 Flow Rate: .O2 Flow Rate (L/min): 2 L/min  IO: Intake/output summary:  Intake/Output Summary (Last 24 hours) at 2016-10-04 0946 Last data filed at 10/04/2016 0800  Gross per 24 hour  Intake          1930.63 ml  Output              650 ml  Net          1280.63 ml    LBM:   Baseline Weight: Weight: 116.6 kg (257 lb 0.9 oz) Most recent weight: Weight: 112.9 kg (248 lb 14.4 oz)     Palliative Assessment/Data:   Flowsheet Rows   Flowsheet Row Most Recent Value  Intake Tab  Referral Department  Trauma  Unit at Time of Referral  ICU  Palliative Care Primary Diagnosis  Trauma  Date Notified  09/07/16  Palliative Care Type  New Palliative care  Reason for referral  Clarify Goals of Care, End of Life Care Assistance  Date of Admission  09/20/2016  Date first seen by Palliative Care  2016/10/04  # of days Palliative referral response time  1 Day(s)  # of days IP prior to Palliative referral  1  Clinical Assessment  Palliative Performance Scale Score  10%  Pain Max last 24 hours  6  Pain Min Last 24 hours  4  Dyspnea Max Last 24 Hours  8  Dyspnea Min Last 24 hours  6  Psychosocial & Spiritual Assessment  Palliative Care Outcomes  Patient/Family meeting held?  Yes  Who was at the meeting?  patient, wife, son, daughter  Palliative Care Outcomes  Improved pain interventions, Improved non-pain symptom therapy, Clarified goals of care, Changed to focus on comfort      Time In: 8:50 Time Out: 10:00 Time Total: 70 min. Greater than 50%  of this time  was spent counseling and coordinating care related to the above assessment and plan.  Signed by: Imogene Burn, PA-C Palliative Medicine Pager: [phone]  Please contact Palliative Medicine Team phone at [phone] for questions and concerns.  For individual provider: See Amion              con

## 2016-09-22 NOTE — Progress Notes (Signed)
SLP Cancellation Note  Patient Details Name: Antonio [name] MRN: [id number] DOB: [birth year]   Cancelled treatment:       Reason Eval/Treat Not Completed: Other (comment) Family pursuing full comfort. No therapy needs per medical team. Please let us know if we can be of assistance.   Germain Osgood 09-12-16, 9:26 AM  Germain Osgood, M.A. CCC-SLP [phone]

## 2016-09-22 NOTE — Progress Notes (Signed)
Called patient's daughter, Lottie Dawson, to notify that her father still had his wedding ring on.  She asked that it be left on and will make a decision about removal a the funeral home. Deoni Cosey C 4:53 PM

## 2016-09-22 DEATH — deceased

## 2016-11-01 ENCOUNTER — Ambulatory Visit (HOSPITAL_COMMUNITY): Payer: Medicare Other | Admitting: Nurse Practitioner

## 2017-01-05 ENCOUNTER — Ambulatory Visit: Payer: Medicare Other | Admitting: Internal Medicine
